# Patient Record
Sex: Male | Born: 1950
Health system: Southern US, Community
[De-identification: ages and names within clinical notes are randomized; demographics above are authoritative.]

## PROBLEM LIST (undated history)

## (undated) ENCOUNTER — Ambulatory Visit: Discharge status: Absent without leave | Payer: PPO

## (undated) DIAGNOSIS — E119 Type 2 diabetes mellitus without complications: Secondary | ICD-10-CM

## (undated) DIAGNOSIS — E785 Hyperlipidemia, unspecified: Secondary | ICD-10-CM

## (undated) DIAGNOSIS — I10 Essential (primary) hypertension: Secondary | ICD-10-CM

## (undated) DIAGNOSIS — F329 Major depressive disorder, single episode, unspecified: Secondary | ICD-10-CM

## (undated) DIAGNOSIS — F32A Depression, unspecified: Secondary | ICD-10-CM

## (undated) HISTORY — DX: Major depressive disorder, single episode, unspecified: F32.9

## (undated) HISTORY — PX: NASAL SINUS SURGERY: SHX719

## (undated) HISTORY — DX: Hyperlipidemia, unspecified: E78.5

## (undated) HISTORY — DX: Essential (primary) hypertension: I10

## (undated) HISTORY — DX: Type 2 diabetes mellitus without complications: E11.9

## (undated) HISTORY — DX: Depression, unspecified: F32.A

---

## 2005-08-24 ENCOUNTER — Ambulatory Visit: Payer: Self-pay | Admitting: Family Medicine

## 2007-02-21 ENCOUNTER — Encounter: Payer: Self-pay | Admitting: Physician Assistant

## 2007-02-27 ENCOUNTER — Encounter: Payer: Self-pay | Admitting: Physician Assistant

## 2008-04-22 ENCOUNTER — Inpatient Hospital Stay: Payer: Self-pay | Admitting: Internal Medicine

## 2008-04-28 ENCOUNTER — Emergency Department: Payer: Self-pay | Admitting: Emergency Medicine

## 2008-10-09 ENCOUNTER — Ambulatory Visit: Payer: Self-pay | Admitting: Urology

## 2009-02-19 ENCOUNTER — Ambulatory Visit: Payer: Self-pay | Admitting: Gastroenterology

## 2009-02-19 LAB — HM COLONOSCOPY

## 2009-08-04 ENCOUNTER — Ambulatory Visit: Payer: Self-pay | Admitting: Urology

## 2009-08-06 ENCOUNTER — Ambulatory Visit: Payer: Self-pay | Admitting: Urology

## 2009-08-18 ENCOUNTER — Ambulatory Visit: Payer: Self-pay | Admitting: Urology

## 2009-08-26 ENCOUNTER — Ambulatory Visit: Payer: Self-pay | Admitting: Urology

## 2009-08-28 ENCOUNTER — Ambulatory Visit: Payer: Self-pay | Admitting: Urology

## 2009-09-23 ENCOUNTER — Ambulatory Visit: Payer: Self-pay | Admitting: Urology

## 2009-11-25 IMAGING — CR DG CHEST 2V
1 series · 2 of 2 positions shown · non-contrast
Comparison: none

REASON FOR EXAM: CP
COMMENTS:

[Series 1: view not recorded · 0.17mm/px · 2 of 2 slices shown]
[im 1/2]
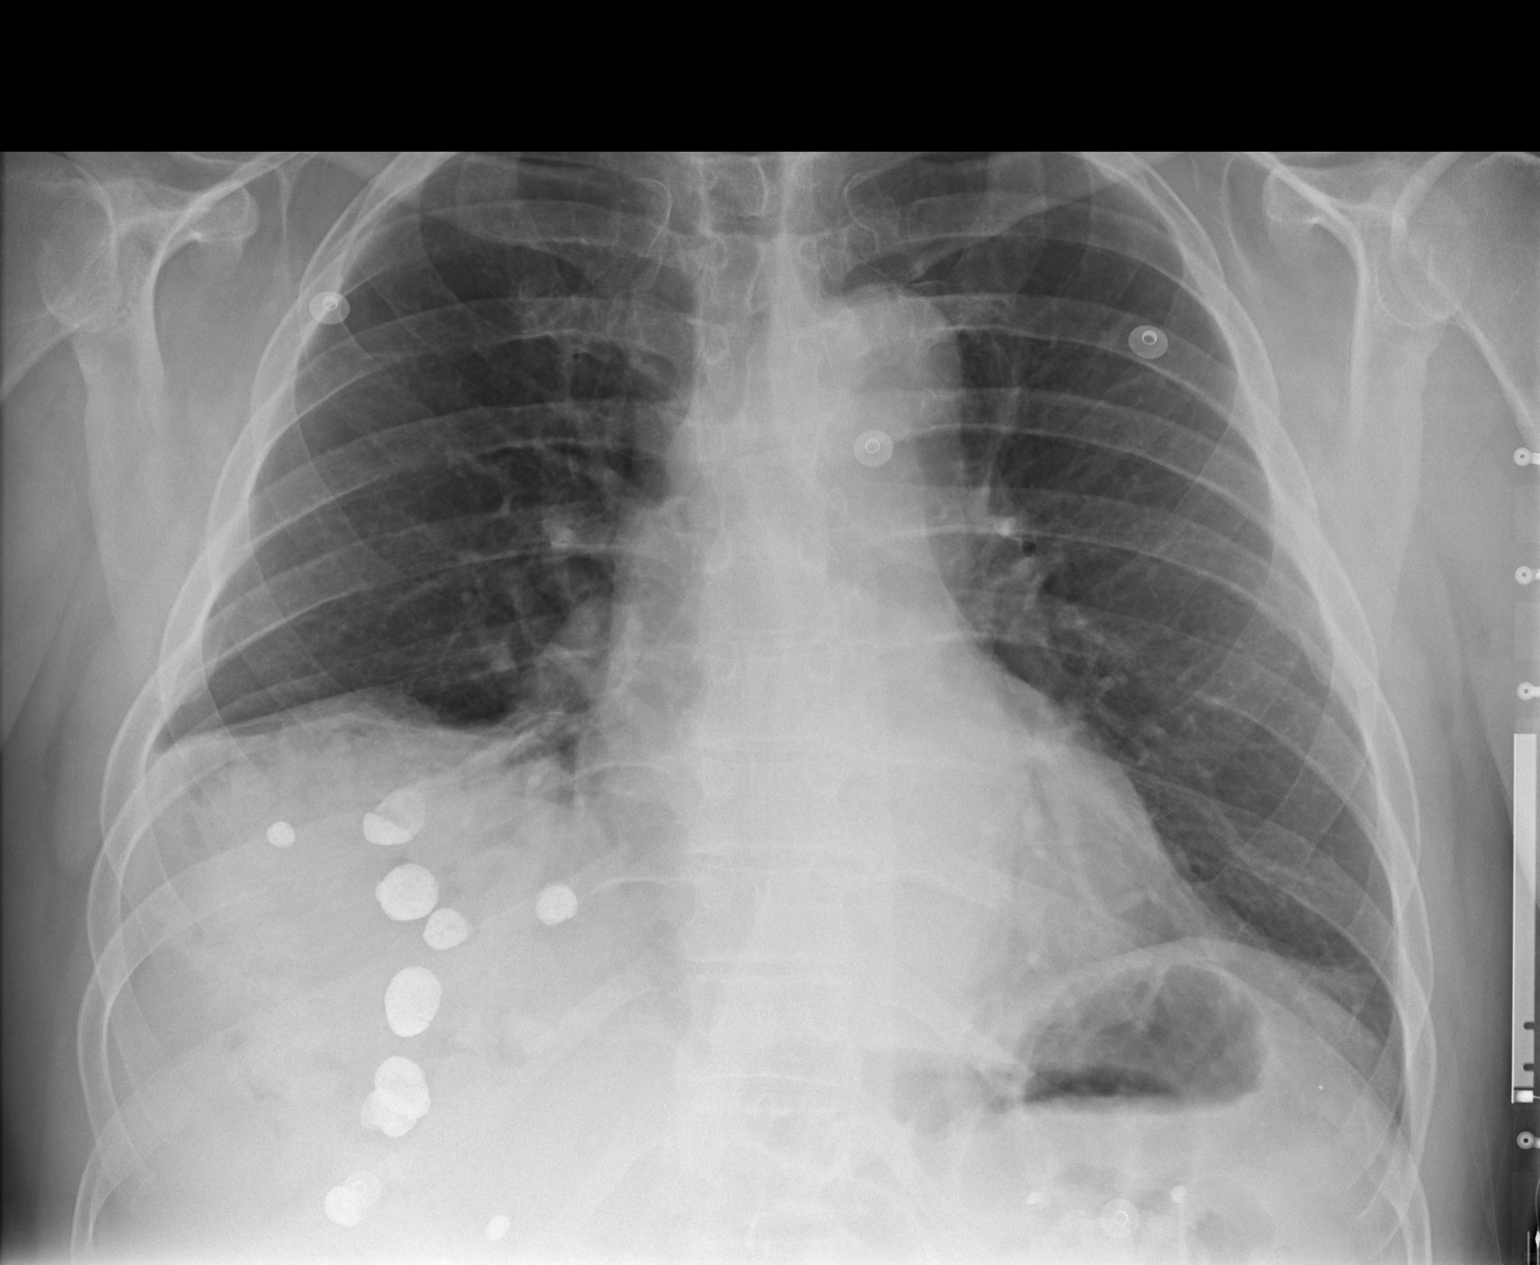
[im 2/2]
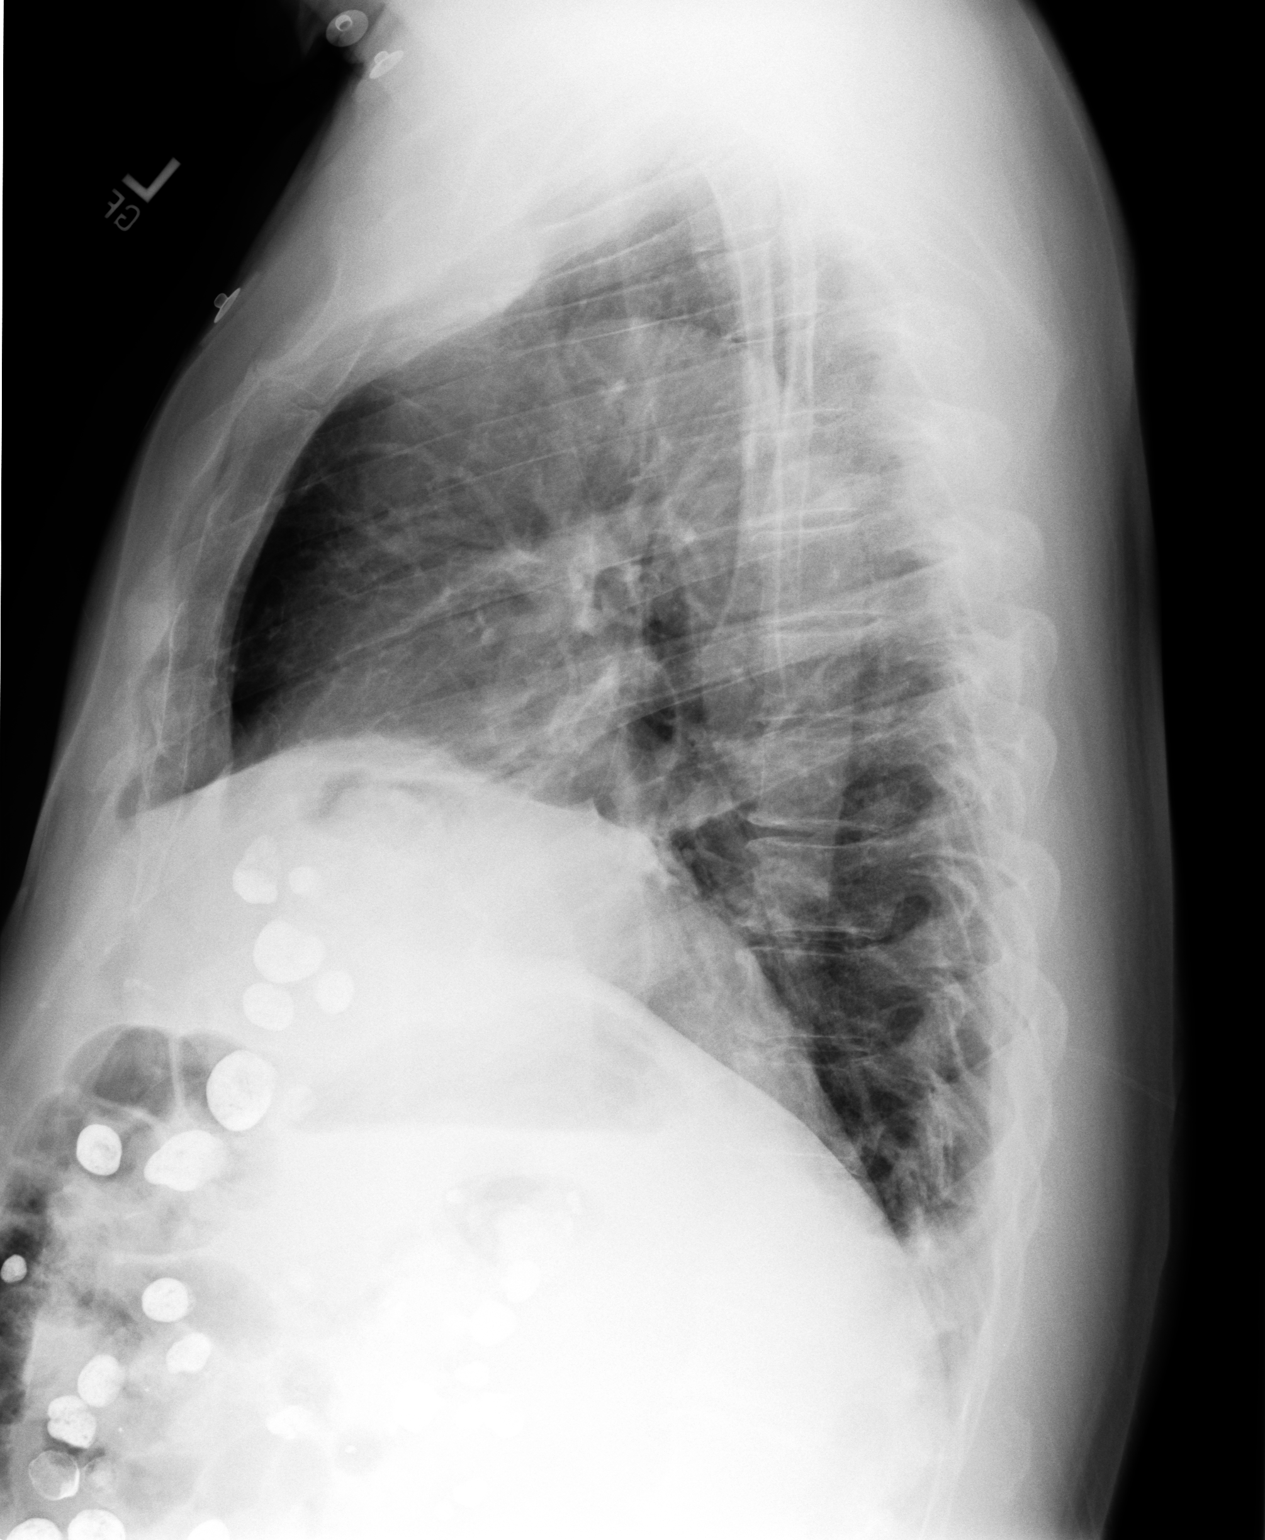

[2 of 2 positions shown; findings below may reference images not displayed]

PROCEDURE:     DXR - DXR CHEST PA (OR AP) AND LATERAL  - April 28, 2008  [DATE]

RESULT:     Comparison is made to the prior exam of 04/24/2008. The
previously small LEFT pleural effusion is no longer seen. There is minimal
LEFT basilar discoid atelectasis. Slight atelectasis is also present at the
RIGHT base. There is chronic elevation of the RIGHT hemidiaphragm, similar
to that noted on the prior exam. The lung fields otherwise are clear of
infiltrate. Heart size is normal. Incidentally noted is barium within
multiple diverticula of the colon consistent with residual contrast from
prior barium swallow examination.
IMPRESSION: 1. There is minimal bibasilar atelectasis.
2. The lung fields otherwise are clear.
3. Heart size is normal.
4. There persists elevation of the RIGHT hemidiaphragm.
5. Barium is present in multiple diverticula consistent with residual
contrast from prior barium swallow examination.

## 2011-09-14 ENCOUNTER — Ambulatory Visit: Payer: Self-pay | Admitting: Family Medicine

## 2014-07-30 LAB — LIPID PANEL
Cholesterol: 153 mg/dL (ref 0–200)
HDL: 45 mg/dL (ref 35–70)
LDL Cholesterol: 76 mg/dL
TRIGLYCERIDES: 158 mg/dL (ref 40–160)

## 2014-07-30 LAB — HEMOGLOBIN A1C: Hgb A1c MFr Bld: 6 % (ref 4.0–6.0)

## 2014-07-30 LAB — FECAL OCCULT BLOOD, GUAIAC: Fecal Occult Blood: NEGATIVE

## 2014-07-30 LAB — PSA: PSA: 2.4

## 2014-07-30 LAB — BASIC METABOLIC PANEL
BUN: 20 mg/dL (ref 4–21)
Creatinine: 0.9 mg/dL (ref <=1.3)
Glucose: 89 mg/dL

## 2014-08-02 DIAGNOSIS — Z0000 Encounter for general adult medical examination without abnormal findings: Secondary | ICD-10-CM | POA: Insufficient documentation

## 2014-08-02 DIAGNOSIS — E7849 Other hyperlipidemia: Secondary | ICD-10-CM | POA: Insufficient documentation

## 2014-08-02 DIAGNOSIS — I10 Essential (primary) hypertension: Secondary | ICD-10-CM | POA: Insufficient documentation

## 2014-08-02 DIAGNOSIS — E663 Overweight: Secondary | ICD-10-CM | POA: Insufficient documentation

## 2014-08-02 DIAGNOSIS — F339 Major depressive disorder, recurrent, unspecified: Secondary | ICD-10-CM | POA: Insufficient documentation

## 2014-08-02 DIAGNOSIS — E119 Type 2 diabetes mellitus without complications: Secondary | ICD-10-CM | POA: Insufficient documentation

## 2014-12-24 ENCOUNTER — Other Ambulatory Visit: Payer: Self-pay

## 2014-12-24 DIAGNOSIS — F329 Major depressive disorder, single episode, unspecified: Secondary | ICD-10-CM

## 2014-12-24 DIAGNOSIS — E785 Hyperlipidemia, unspecified: Secondary | ICD-10-CM

## 2014-12-24 DIAGNOSIS — E119 Type 2 diabetes mellitus without complications: Secondary | ICD-10-CM

## 2014-12-24 DIAGNOSIS — F32A Depression, unspecified: Secondary | ICD-10-CM

## 2014-12-24 DIAGNOSIS — I10 Essential (primary) hypertension: Secondary | ICD-10-CM

## 2014-12-24 MED ORDER — LOVASTATIN 20 MG PO TABS: 20.0000 mg | ORAL_TABLET | Freq: Every day | ORAL | Status: DC

## 2014-12-24 MED ORDER — AMLODIPINE BESYLATE 10 MG PO TABS: 10.0000 mg | ORAL_TABLET | Freq: Every day | ORAL | Status: DC

## 2014-12-24 MED ORDER — METFORMIN HCL 1000 MG PO TABS: 1000.0000 mg | ORAL_TABLET | Freq: Two times a day (BID) | ORAL | Status: DC

## 2014-12-24 MED ORDER — LISINOPRIL-HYDROCHLOROTHIAZIDE 20-12.5 MG PO TABS: 1.0000 | ORAL_TABLET | Freq: Every day | ORAL | Status: DC

## 2014-12-24 MED ORDER — GLIPIZIDE ER 2.5 MG PO TB24: 2.5000 mg | ORAL_TABLET | Freq: Every day | ORAL | Status: DC

## 2014-12-24 MED ORDER — VENLAFAXINE HCL ER 75 MG PO CP24: 75.0000 mg | ORAL_CAPSULE | Freq: Every day | ORAL | Status: DC

## 2015-02-17 ENCOUNTER — Ambulatory Visit (INDEPENDENT_AMBULATORY_CARE_PROVIDER_SITE_OTHER): Payer: BLUE CROSS/BLUE SHIELD | Admitting: Family Medicine

## 2015-02-17 ENCOUNTER — Encounter: Payer: Self-pay | Admitting: Family Medicine

## 2015-02-17 VITALS — BP 134/64 | HR 64 | Ht 73.0 in | Wt 192.0 lb

## 2015-02-17 DIAGNOSIS — I10 Essential (primary) hypertension: Secondary | ICD-10-CM | POA: Diagnosis not present

## 2015-02-17 DIAGNOSIS — Z0000 Encounter for general adult medical examination without abnormal findings: Secondary | ICD-10-CM

## 2015-02-17 DIAGNOSIS — Z23 Encounter for immunization: Secondary | ICD-10-CM | POA: Diagnosis not present

## 2015-02-17 DIAGNOSIS — E119 Type 2 diabetes mellitus without complications: Secondary | ICD-10-CM | POA: Diagnosis not present

## 2015-02-17 DIAGNOSIS — N400 Enlarged prostate without lower urinary tract symptoms: Secondary | ICD-10-CM | POA: Diagnosis not present

## 2015-02-17 DIAGNOSIS — E785 Hyperlipidemia, unspecified: Secondary | ICD-10-CM | POA: Diagnosis not present

## 2015-02-17 NOTE — Progress Notes (Signed)
Name: Andrew [name]   MRN: [id number]    DOB: December 02, 1950   Date:02/17/2015       Progress Note  Subjective  Chief Complaint  Chief Complaint  Patient presents with  . Annual Exam    HPI Comments: Patient for physical exam with no subjective/objective concerns.   No problem-specific assessment & plan notes found for this encounter.   Past Medical History  Diagnosis Date  . Hypertension   . Hyperlipidemia   . Diabetes mellitus without complication [facility])     Past Surgical History  Procedure Laterality Date  . Nasal sinus surgery      History reviewed. No pertinent family history.  Social History   Social History  . Marital Status: Married    Spouse Name: N/A  . Number of Children: N/A  . Years of Education: N/A   Occupational History  . Not on file.   Social History Main Topics  . Smoking status: Never Smoker   . Smokeless tobacco: Not on file  . Alcohol Use: 0.0 oz/week    0 Standard drinks or equivalent per week  . Drug Use: No  . Sexual Activity: Yes   Other Topics Concern  . Not on file   Social History Narrative    No Known Allergies   Review of Systems  Constitutional: Negative for fever, chills, weight loss and malaise/fatigue.  HENT: Negative for ear discharge, ear pain and sore throat.   Eyes: Negative for blurred vision.  Respiratory: Negative for cough, sputum production, shortness of breath and wheezing.   Cardiovascular: Negative for chest pain, palpitations and leg swelling.  Gastrointestinal: Negative for heartburn, nausea, abdominal pain, diarrhea, constipation, blood in stool and melena.  Genitourinary: Positive for frequency. Negative for dysuria, urgency and hematuria.  Musculoskeletal: Negative for myalgias, back pain, joint pain and neck pain.  Skin: Negative for rash.  Neurological: Negative for dizziness, tingling, sensory change, focal weakness and headaches.  Endo/Heme/Allergies: Negative for environmental allergies and  polydipsia. Does not bruise/bleed easily.  Psychiatric/Behavioral: Negative for depression and suicidal ideas. The patient is not nervous/anxious and does not have insomnia.      Objective  Filed Vitals:   02/17/15 0902  BP: 134/64  Pulse: 64  Height: 6\' 1"  (1.854 m)  Weight: 192 lb (87.091 kg)    Physical Exam  Constitutional: He is oriented to person, place, and time and well-developed, well-nourished, and in no distress.  HENT:  Head: Normocephalic.  Right Ear: External ear normal.  Left Ear: External ear normal.  Nose: Nose normal.  Mouth/Throat: Oropharynx is clear and moist.  Eyes: Conjunctivae and EOM are normal. Pupils are equal, round, and reactive to light. Right eye exhibits no discharge. Left eye exhibits no discharge. No scleral icterus.  Neck: Normal range of motion. Neck supple. No JVD present. No tracheal deviation present. No thyromegaly present.  Cardiovascular: Normal rate, regular rhythm, normal heart sounds and intact distal pulses.  Exam reveals no gallop and no friction rub.   No murmur heard. Pulmonary/Chest: Breath sounds normal. No respiratory distress. He has no wheezes. He has no rales.  Abdominal: Soft. Bowel sounds are normal. He exhibits no mass. There is no hepatosplenomegaly. There is no tenderness. There is no rebound, no guarding and no CVA tenderness.  Genitourinary: Rectum normal and prostate normal. Guaiac negative stool.  Musculoskeletal: Normal range of motion. He exhibits no edema or tenderness.  Lymphadenopathy:    He has no cervical adenopathy.  Neurological: He is alert and oriented  to person, place, and time. He has normal sensation, normal strength, normal reflexes and intact cranial nerves. No cranial nerve deficit.  Skin: Skin is warm. No rash noted.  Psychiatric: Mood and affect normal.  Nursing note and vitals reviewed.     Assessment & Plan  Problem List Items Addressed This Visit      Cardiovascular and Mediastinum    Essential (primary) hypertension   Relevant Orders   Renal Function Panel     Endocrine   Diabetes mellitus, type 2 ([place])   Relevant Orders   Renal Function Panel   HgB A1c    Other Visit Diagnoses    Annual physical exam    -  Primary    BPH (benign prostatic hyperplasia)        Relevant Orders    PSA    Hyperlipidemia        Relevant Orders    Lipid Profile    Need for influenza vaccination        Relevant Orders    Flu Vaccine QUAD 36+ mos PF IM (Fluarix & Fluzone Quad PF) (Completed)         Dr. Deanna Jones [facility]  02/17/2015

## 2015-02-18 LAB — HEMOGLOBIN A1C
Est. average glucose Bld gHb Est-mCnc: 117 mg/dL
Hgb A1c MFr Bld: 5.7 % — ABNORMAL HIGH (ref 4.8–5.6)

## 2015-02-18 LAB — PSA: PROSTATE SPECIFIC AG, SERUM: 2.1 ng/mL (ref 0.0–4.0)

## 2015-02-18 LAB — RENAL FUNCTION PANEL
ALBUMIN: 4.7 g/dL (ref 3.6–4.8)
BUN/Creatinine Ratio: 23 — ABNORMAL HIGH (ref 10–22)
BUN: 20 mg/dL (ref 8–27)
CO2: 27 mmol/L (ref 18–29)
CREATININE: 0.86 mg/dL (ref 0.76–1.27)
Calcium: 9.5 mg/dL (ref 8.6–10.2)
Chloride: 99 mmol/L (ref 97–106)
GFR calc Af Amer: 107 mL/min/{1.73_m2} (ref >59)
GFR, EST NON AFRICAN AMERICAN: 92 mL/min/{1.73_m2} (ref >59)
Glucose: 112 mg/dL — ABNORMAL HIGH (ref 65–99)
PHOSPHORUS: 3.3 mg/dL (ref 2.5–4.5)
Potassium: 4.8 mmol/L (ref 3.5–5.2)
Sodium: 142 mmol/L (ref 136–144)

## 2015-02-18 LAB — LIPID PANEL
Chol/HDL Ratio: 2.8 ratio units (ref 0.0–5.0)
Cholesterol, Total: 146 mg/dL (ref 100–199)
HDL: 53 mg/dL (ref >39)
LDL CALC: 75 mg/dL (ref 0–99)
TRIGLYCERIDES: 89 mg/dL (ref 0–149)
VLDL CHOLESTEROL CAL: 18 mg/dL (ref 5–40)

## 2015-04-21 ENCOUNTER — Other Ambulatory Visit: Payer: Self-pay

## 2015-04-21 DIAGNOSIS — E119 Type 2 diabetes mellitus without complications: Secondary | ICD-10-CM

## 2015-04-21 MED ORDER — GLIPIZIDE ER 2.5 MG PO TB24: 2.5000 mg | ORAL_TABLET | Freq: Every day | ORAL | Status: DC

## 2015-04-21 MED ORDER — METFORMIN HCL 1000 MG PO TABS: 1000.0000 mg | ORAL_TABLET | Freq: Two times a day (BID) | ORAL | Status: DC

## 2015-06-30 ENCOUNTER — Other Ambulatory Visit: Payer: Self-pay | Admitting: Family Medicine

## 2015-07-31 ENCOUNTER — Other Ambulatory Visit: Payer: Self-pay

## 2015-07-31 MED ORDER — ETODOLAC 500 MG PO TABS: 500.0000 mg | ORAL_TABLET | Freq: Two times a day (BID) | ORAL | Status: DC

## 2015-08-01 ENCOUNTER — Other Ambulatory Visit: Payer: Self-pay

## 2015-08-01 ENCOUNTER — Encounter: Payer: Self-pay | Admitting: Family Medicine

## 2015-08-01 ENCOUNTER — Ambulatory Visit (INDEPENDENT_AMBULATORY_CARE_PROVIDER_SITE_OTHER): Payer: BLUE CROSS/BLUE SHIELD | Admitting: Family Medicine

## 2015-08-01 VITALS — BP 140/80 | HR 78 | Ht 73.0 in | Wt 192.0 lb

## 2015-08-01 DIAGNOSIS — M25571 Pain in right ankle and joints of right foot: Secondary | ICD-10-CM | POA: Diagnosis not present

## 2015-08-01 MED ORDER — ETODOLAC 500 MG PO TABS: 500.0000 mg | ORAL_TABLET | Freq: Two times a day (BID) | ORAL | Status: DC

## 2015-08-01 NOTE — Progress Notes (Signed)
Name: Andrew [name]   MRN: [id number]    DOB: [birth year]   Date:08/01/2015       Progress Note  Subjective  Chief Complaint  Chief Complaint  Patient presents with  . Ankle Pain    started with swelling yesterday am- called in etodolac. Swelling hasn't gone down and now has pain to the touch    Ankle Pain  The incident occurred 2 days ago. The incident occurred at home. There was no injury mechanism. The pain is present in the right ankle. The quality of the pain is described as aching. The pain is at a severity of 4/10. The pain is mild. The pain has been intermittent since onset. Pertinent negatives include no inability to bear weight, loss of motion, loss of sensation, muscle weakness, numbness or tingling. He reports no foreign bodies present. He has tried acetaminophen for the symptoms. The treatment provided mild relief.    No problem-specific assessment & plan notes found for this encounter.   Past Medical History  Diagnosis Date  . Hypertension   . Hyperlipidemia   . Diabetes mellitus without complication [facility])     Past Surgical History  Procedure Laterality Date  . Nasal sinus surgery      No family history on file.  Social History   Social History  . Marital Status: Married    Spouse Name: N/A  . Number of Children: N/A  . Years of Education: N/A   Occupational History  . Not on file.   Social History Main Topics  . Smoking status: Never Smoker   . Smokeless tobacco: Not on file  . Alcohol Use: 0.0 oz/week    0 Standard drinks or equivalent per week  . Drug Use: No  . Sexual Activity: Yes   Other Topics Concern  . Not on file   Social History Narrative    No Known Allergies   Review of Systems  Constitutional: Negative for fever, chills, weight loss and malaise/fatigue.  HENT: Negative for ear discharge, ear pain and sore throat.   Eyes: Negative for blurred vision.  Respiratory: Negative for cough, sputum production, shortness of breath and  wheezing.   Cardiovascular: Negative for chest pain, palpitations and leg swelling.  Gastrointestinal: Negative for heartburn, nausea, abdominal pain, diarrhea, constipation, blood in stool and melena.  Genitourinary: Negative for dysuria, urgency, frequency and hematuria.  Musculoskeletal: Negative for myalgias, back pain, joint pain and neck pain.  Skin: Negative for rash.  Neurological: Negative for dizziness, tingling, sensory change, focal weakness, numbness and headaches.  Endo/Heme/Allergies: Negative for environmental allergies and polydipsia. Does not bruise/bleed easily.  Psychiatric/Behavioral: Negative for depression and suicidal ideas. The patient is not nervous/anxious and does not have insomnia.      Objective  Filed Vitals:   08/01/15 1617  BP: 140/80  Pulse: 78  Height: 6\' 1"  (1.854 m)  Weight: 192 lb (87.091 kg)    Physical Exam  Constitutional: He is oriented to person, place, and time and well-developed, well-nourished, and in no distress.  HENT:  Head: Normocephalic.  Right Ear: External ear normal.  Left Ear: External ear normal.  Nose: Nose normal.  Mouth/Throat: Oropharynx is clear and moist.  Eyes: Conjunctivae and EOM are normal. Pupils are equal, round, and reactive to light. Right eye exhibits no discharge. Left eye exhibits no discharge. No scleral icterus.  Neck: Normal range of motion. Neck supple. No JVD present. No tracheal deviation present. No thyromegaly present.  Cardiovascular: Normal rate, regular rhythm, normal  heart sounds and intact distal pulses.  Exam reveals no gallop and no friction rub.   No murmur heard. Pulmonary/Chest: Breath sounds normal. No respiratory distress. He has no wheezes. He has no rales.  Abdominal: Soft. Bowel sounds are normal. He exhibits no mass. There is no hepatosplenomegaly. There is no tenderness. There is no rebound, no guarding and no CVA tenderness.  Musculoskeletal: Normal range of motion. He exhibits no  edema.       Right ankle: He exhibits swelling. He exhibits normal range of motion. Tenderness. Lateral malleolus and CF ligament tenderness found.  Lymphadenopathy:    He has no cervical adenopathy.  Neurological: He is alert and oriented to person, place, and time. He has normal sensation, normal strength, normal reflexes and intact cranial nerves. No cranial nerve deficit.  Skin: Skin is warm. No rash noted.  Psychiatric: Mood and affect normal.  Nursing note and vitals reviewed.     Assessment & Plan  Problem List Items Addressed This Visit    None    Visit Diagnoses    Ankle pain, right    -  Primary    Relevant Medications    etodolac (LODINE) 500 MG tablet         Dr. Mac[facility]  08/01/2015

## 2015-08-07 ENCOUNTER — Encounter: Payer: Self-pay | Admitting: Family Medicine

## 2015-08-07 ENCOUNTER — Other Ambulatory Visit: Payer: Self-pay | Admitting: Family Medicine

## 2015-08-07 ENCOUNTER — Ambulatory Visit (INDEPENDENT_AMBULATORY_CARE_PROVIDER_SITE_OTHER): Payer: BLUE CROSS/BLUE SHIELD | Admitting: Family Medicine

## 2015-08-07 VITALS — BP 128/80 | HR 68 | Ht 73.0 in | Wt 189.0 lb

## 2015-08-07 DIAGNOSIS — E119 Type 2 diabetes mellitus without complications: Secondary | ICD-10-CM | POA: Diagnosis not present

## 2015-08-07 DIAGNOSIS — E663 Overweight: Secondary | ICD-10-CM | POA: Diagnosis not present

## 2015-08-07 DIAGNOSIS — F339 Major depressive disorder, recurrent, unspecified: Secondary | ICD-10-CM

## 2015-08-07 DIAGNOSIS — E785 Hyperlipidemia, unspecified: Secondary | ICD-10-CM | POA: Diagnosis not present

## 2015-08-07 DIAGNOSIS — I10 Essential (primary) hypertension: Secondary | ICD-10-CM

## 2015-08-07 DIAGNOSIS — E7849 Other hyperlipidemia: Secondary | ICD-10-CM

## 2015-08-07 DIAGNOSIS — E784 Other hyperlipidemia: Secondary | ICD-10-CM

## 2015-08-07 DIAGNOSIS — Z0000 Encounter for general adult medical examination without abnormal findings: Secondary | ICD-10-CM | POA: Diagnosis not present

## 2015-08-07 MED ORDER — AMLODIPINE BESYLATE 10 MG PO TABS: 10.0000 mg | ORAL_TABLET | Freq: Every day | ORAL | Status: DC

## 2015-08-07 MED ORDER — LOVASTATIN 20 MG PO TABS: 20.0000 mg | ORAL_TABLET | Freq: Every day | ORAL | Status: DC

## 2015-08-07 MED ORDER — GLIPIZIDE ER 2.5 MG PO TB24: 2.5000 mg | ORAL_TABLET | Freq: Every day | ORAL | Status: DC

## 2015-08-07 MED ORDER — LISINOPRIL-HYDROCHLOROTHIAZIDE 20-12.5 MG PO TABS: 1.0000 | ORAL_TABLET | Freq: Every day | ORAL | Status: DC

## 2015-08-07 MED ORDER — VENLAFAXINE HCL ER 75 MG PO CP24: 75.0000 mg | ORAL_CAPSULE | Freq: Every day | ORAL | Status: DC

## 2015-08-07 MED ORDER — METFORMIN HCL 1000 MG PO TABS: 1000.0000 mg | ORAL_TABLET | Freq: Two times a day (BID) | ORAL | Status: DC

## 2015-08-07 NOTE — Progress Notes (Signed)
Name: Andrew [name]   MRN: [id number]    DOB: Jun 04, 1950   Date:08/07/2015       Progress Note  Subjective  Chief Complaint  Chief Complaint  Patient presents with  . Hypertension  . Hyperlipidemia  . Diabetes  . Depression    Hypertension This is a chronic problem. The current episode started more than 1 year ago. The problem has been gradually improving since onset. The problem is controlled. Pertinent negatives include no anxiety, blurred vision, chest pain, headaches, malaise/fatigue, neck pain, orthopnea, palpitations, peripheral edema, PND, shortness of breath or sweats. There are no associated agents to hypertension. There are no known risk factors for coronary artery disease. Past treatments include ACE inhibitors, diuretics and calcium channel blockers. The current treatment provides mild improvement. There are no compliance problems.  There is no history of angina, kidney disease, CAD/MI, CVA, heart failure, left ventricular hypertrophy, PVD, renovascular disease or retinopathy. There is no history of chronic renal disease or a hypertension causing med.  Hyperlipidemia This is a chronic problem. The current episode started more than 1 year ago. The problem is controlled. Recent lipid tests were reviewed and are normal. He has no history of chronic renal disease, diabetes, hypothyroidism, liver disease, obesity or nephrotic syndrome. Pertinent negatives include no chest pain, focal weakness, myalgias or shortness of breath. The current treatment provides mild improvement of lipids. There are no compliance problems.  Risk factors for coronary artery disease include diabetes mellitus and dyslipidemia.  Diabetes He presents for his follow-up diabetic visit. He has type 2 diabetes mellitus. His disease course has been stable. Pertinent negatives for hypoglycemia include no confusion, dizziness, headaches, hunger, mood changes, nervousness/anxiousness, pallor, seizures, sleepiness, speech  difficulty, sweats or tremors. Pertinent negatives for diabetes include no blurred vision, no chest pain, no fatigue, no foot paresthesias, no foot ulcerations, no polydipsia, no polyphagia, no polyuria, no visual change, no weakness and no weight loss. There are no hypoglycemic complications. There are no diabetic complications. Pertinent negatives for diabetic complications include no CVA, PVD or retinopathy. There are no known risk factors for coronary artery disease. Current diabetic treatment includes diet and oral agent (dual therapy). He is compliant with treatment all of the time. His weight is stable. He is following a generally healthy diet. He has not had a previous visit with a dietitian. He participates in exercise intermittently. His breakfast blood glucose is taken between 8-9 am. His breakfast blood glucose range is generally 180-200 mg/dl. An ACE inhibitor/angiotensin II receptor blocker is being taken. He does not see a podiatrist.Eye exam is current.  Depression        This is a chronic problem.  The current episode started more than 1 year ago.   The onset quality is gradual.   The problem has been gradually improving since onset.  Associated symptoms include no fatigue, no helplessness, no hopelessness, does not have insomnia, not irritable, no restlessness, no decreased interest, no myalgias, no headaches, not sad and no suicidal ideas.     The symptoms are aggravated by nothing.  Past treatments include SSRIs - Selective serotonin reuptake inhibitors.  Compliance with treatment is good.   Pertinent negatives include no chronic fatigue syndrome, no hypothyroidism, no terminal illness, no anxiety and no eating disorder.   No problem-specific assessment & plan notes found for this encounter.   Past Medical History  Diagnosis Date  . Hypertension   . Hyperlipidemia   . Diabetes mellitus without complication ([place])  Past Surgical History  Procedure Laterality Date  . Nasal sinus  surgery      History reviewed. No pertinent family history.  Social History   Social History  . Marital Status: Married    Spouse Name: N/A  . Number of Children: N/A  . Years of Education: N/A   Occupational History  . Not on file.   Social History Main Topics  . Smoking status: Never Smoker   . Smokeless tobacco: Not on file  . Alcohol Use: 0.0 oz/week    0 Standard drinks or equivalent per week  . Drug Use: No  . Sexual Activity: Yes   Other Topics Concern  . Not on file   Social History Narrative    No Known Allergies   Review of Systems  Constitutional: Negative for fever, chills, weight loss, malaise/fatigue and fatigue.  HENT: Negative for ear discharge, ear pain and sore throat.   Eyes: Negative for blurred vision.  Respiratory: Negative for cough, sputum production, shortness of breath and wheezing.   Cardiovascular: Negative for chest pain, palpitations, orthopnea, leg swelling and PND.  Gastrointestinal: Negative for heartburn, nausea, abdominal pain, diarrhea, constipation, blood in stool and melena.  Genitourinary: Negative for dysuria, urgency, frequency and hematuria.  Musculoskeletal: Negative for myalgias, back pain, joint pain and neck pain.  Skin: Negative for pallor and rash.  Neurological: Negative for dizziness, tingling, tremors, sensory change, focal weakness, seizures, speech difficulty, weakness and headaches.  Endo/Heme/Allergies: Negative for environmental allergies, polydipsia and polyphagia. Does not bruise/bleed easily.  Psychiatric/Behavioral: Positive for depression. Negative for suicidal ideas and confusion. The patient is not nervous/anxious and does not have insomnia.      Objective  Filed Vitals:   08/07/15 0841  BP: 128/80  Pulse: 68  Height: 6\' 1"  (1.854 m)  Weight: 189 lb (85.73 kg)    Physical Exam  Constitutional: He is oriented to person, place, and time and well-developed, well-nourished, and in no distress. He is  not irritable.  HENT:  Head: Normocephalic.  Right Ear: Tympanic membrane, external ear and ear canal normal.  Left Ear: Tympanic membrane, external ear and ear canal normal.  Nose: Nose normal.  Mouth/Throat: Oropharynx is clear and moist.  Eyes: Conjunctivae and EOM are normal. Pupils are equal, round, and reactive to light. Right eye exhibits no discharge. Left eye exhibits no discharge. No scleral icterus.  Neck: Normal range of motion. Neck supple. No JVD present. No tracheal deviation present. No thyromegaly present.  Cardiovascular: Normal rate, regular rhythm, normal heart sounds and intact distal pulses.  Exam reveals no gallop and no friction rub.   No murmur heard. Pulmonary/Chest: Breath sounds normal. No respiratory distress. He has no wheezes. He has no rales.  Abdominal: Soft. Bowel sounds are normal. He exhibits no mass. There is no hepatosplenomegaly. There is no tenderness. There is no rebound, no guarding and no CVA tenderness.  Musculoskeletal: Normal range of motion. He exhibits no edema or tenderness.  Lymphadenopathy:    He has no cervical adenopathy.  Neurological: He is alert and oriented to person, place, and time. He has normal sensation, normal strength and intact cranial nerves. No cranial nerve deficit.  Foot exam normal  Skin: Skin is warm. No rash noted.  Psychiatric: Mood and affect normal.  Nursing note and vitals reviewed.     Assessment & Plan  Problem List Items Addressed This Visit      Cardiovascular and Mediastinum   Essential (primary) hypertension - Primary   Relevant  Medications   lovastatin (MEVACOR) 20 MG tablet   amLODipine (NORVASC) 10 MG tablet   lisinopril-hydrochlorothiazide (PRINZIDE,ZESTORETIC) 20-12.5 MG tablet   Other Relevant Orders   Renal Function Panel     Endocrine   Diabetes mellitus, type 2 (HCC)   Relevant Medications   metFORMIN (GLUCOPHAGE) 1000 MG tablet   glipiZIDE (GLUCOTROL XL) 2.5 MG 24 hr tablet    lovastatin (MEVACOR) 20 MG tablet   lisinopril-hydrochlorothiazide (PRINZIDE,ZESTORETIC) 20-12.5 MG tablet   Other Relevant Orders   Hemoglobin A1c   Lipid Profile   Microalbumin / creatinine urine ratio     Other   Familial multiple lipoprotein-type hyperlipidemia   Relevant Medications   lovastatin (MEVACOR) 20 MG tablet   amLODipine (NORVASC) 10 MG tablet   lisinopril-hydrochlorothiazide (PRINZIDE,ZESTORETIC) 20-12.5 MG tablet   Routine general medical examination at a health care facility   Recurrent major depressive episodes (HCC)   Relevant Medications   venlafaxine XR (EFFEXOR-XR) 75 MG 24 hr capsule   Overweight    Other Visit Diagnoses    Hyperlipidemia        Relevant Medications    lovastatin (MEVACOR) 20 MG tablet    amLODipine (NORVASC) 10 MG tablet    lisinopril-hydrochlorothiazide (PRINZIDE,ZESTORETIC) 20-12.5 MG tablet         Dr. Rabecka [facility]  08/07/2015

## 2015-08-08 LAB — HEMOGLOBIN A1C
ESTIMATED AVERAGE GLUCOSE: 128 mg/dL
Hgb A1c MFr Bld: 6.1 % — ABNORMAL HIGH (ref 4.8–5.6)

## 2015-08-08 LAB — RENAL FUNCTION PANEL
ALBUMIN: 4.6 g/dL (ref 3.6–4.8)
BUN / CREAT RATIO: 18 (ref 10–24)
BUN: 16 mg/dL (ref 8–27)
CALCIUM: 9.6 mg/dL (ref 8.6–10.2)
CHLORIDE: 96 mmol/L (ref 96–106)
CO2: 27 mmol/L (ref 18–29)
CREATININE: 0.87 mg/dL (ref 0.76–1.27)
GFR calc Af Amer: 105 mL/min/{1.73_m2} (ref >59)
GFR calc non Af Amer: 91 mL/min/{1.73_m2} (ref >59)
Glucose: 99 mg/dL (ref 65–99)
PHOSPHORUS: 3.1 mg/dL (ref 2.5–4.5)
Potassium: 4 mmol/L (ref 3.5–5.2)
Sodium: 141 mmol/L (ref 134–144)

## 2015-08-08 LAB — LIPID PANEL
CHOLESTEROL TOTAL: 179 mg/dL (ref 100–199)
Chol/HDL Ratio: 3.9 ratio units (ref 0.0–5.0)
HDL: 46 mg/dL (ref >39)
LDL CALC: 111 mg/dL — AB (ref 0–99)
TRIGLYCERIDES: 112 mg/dL (ref 0–149)
VLDL CHOLESTEROL CAL: 22 mg/dL (ref 5–40)

## 2015-08-08 LAB — MICROALBUMIN / CREATININE URINE RATIO
Creatinine, Urine: 170 mg/dL
MICROALB/CREAT RATIO: 535.5 mg/g creat — ABNORMAL HIGH (ref 0.0–30.0)
Microalbumin, Urine: 910.3 ug/mL

## 2015-09-28 ENCOUNTER — Other Ambulatory Visit: Payer: Self-pay | Admitting: Family Medicine

## 2016-02-24 ENCOUNTER — Other Ambulatory Visit: Payer: Self-pay | Admitting: Family Medicine

## 2016-02-24 DIAGNOSIS — E119 Type 2 diabetes mellitus without complications: Secondary | ICD-10-CM

## 2016-02-25 ENCOUNTER — Other Ambulatory Visit: Payer: Self-pay

## 2016-02-25 DIAGNOSIS — E119 Type 2 diabetes mellitus without complications: Secondary | ICD-10-CM

## 2016-02-25 MED ORDER — GLIPIZIDE ER 2.5 MG PO TB24: 2.5000 mg | ORAL_TABLET | Freq: Every day | ORAL | 0 refills | Status: DC

## 2016-03-03 ENCOUNTER — Other Ambulatory Visit: Payer: Self-pay | Admitting: Family Medicine

## 2016-03-03 DIAGNOSIS — E119 Type 2 diabetes mellitus without complications: Secondary | ICD-10-CM

## 2016-03-15 ENCOUNTER — Ambulatory Visit (INDEPENDENT_AMBULATORY_CARE_PROVIDER_SITE_OTHER): Payer: BLUE CROSS/BLUE SHIELD | Admitting: Family Medicine

## 2016-03-15 ENCOUNTER — Encounter: Payer: Self-pay | Admitting: Family Medicine

## 2016-03-15 VITALS — BP 142/82 | HR 58 | Resp 16 | Ht 73.0 in | Wt 192.6 lb

## 2016-03-15 DIAGNOSIS — E119 Type 2 diabetes mellitus without complications: Secondary | ICD-10-CM | POA: Diagnosis not present

## 2016-03-15 DIAGNOSIS — E784 Other hyperlipidemia: Secondary | ICD-10-CM | POA: Diagnosis not present

## 2016-03-15 DIAGNOSIS — E7849 Other hyperlipidemia: Secondary | ICD-10-CM

## 2016-03-15 DIAGNOSIS — I10 Essential (primary) hypertension: Secondary | ICD-10-CM

## 2016-03-15 DIAGNOSIS — E663 Overweight: Secondary | ICD-10-CM | POA: Diagnosis not present

## 2016-03-15 DIAGNOSIS — F339 Major depressive disorder, recurrent, unspecified: Secondary | ICD-10-CM

## 2016-03-15 DIAGNOSIS — E782 Mixed hyperlipidemia: Secondary | ICD-10-CM

## 2016-03-15 MED ORDER — METFORMIN HCL 1000 MG PO TABS: 1000.0000 mg | ORAL_TABLET | Freq: Two times a day (BID) | ORAL | 1 refills | Status: DC

## 2016-03-15 MED ORDER — GLIPIZIDE ER 2.5 MG PO TB24: 2.5000 mg | ORAL_TABLET | Freq: Every day | ORAL | 1 refills | Status: DC

## 2016-03-15 MED ORDER — LISINOPRIL-HYDROCHLOROTHIAZIDE 20-12.5 MG PO TABS: 1.0000 | ORAL_TABLET | Freq: Every day | ORAL | 1 refills | Status: DC

## 2016-03-15 MED ORDER — VENLAFAXINE HCL ER 75 MG PO CP24: 75.0000 mg | ORAL_CAPSULE | Freq: Every day | ORAL | 1 refills | Status: DC

## 2016-03-15 MED ORDER — AMLODIPINE BESYLATE 10 MG PO TABS: 10.0000 mg | ORAL_TABLET | Freq: Every day | ORAL | 1 refills | Status: DC

## 2016-03-15 MED ORDER — LOVASTATIN 20 MG PO TABS: 20.0000 mg | ORAL_TABLET | Freq: Every day | ORAL | 1 refills | Status: DC

## 2016-03-15 NOTE — Progress Notes (Signed)
Name: Andrew [name]   MRN: [id number]    DOB: 10/22/1950   Date:03/15/2016       Progress Note  Subjective  Chief Complaint  Chief Complaint  Patient presents with  . Hypertension    Hypertension  This is a chronic problem. The current episode started more than 1 year ago. The problem has been waxing and waning since onset. The problem is controlled. Pertinent negatives include no anxiety, blurred vision, chest pain, headaches, malaise/fatigue, neck pain, orthopnea, palpitations, peripheral edema, PND, shortness of breath or sweats. There are no associated agents to hypertension. There are no known risk factors for coronary artery disease. Past treatments include diuretics, ACE inhibitors and calcium channel blockers. The current treatment provides no improvement. There are no compliance problems.  There is no history of angina, kidney disease, CAD/MI, CVA, heart failure, left ventricular hypertrophy, PVD, renovascular disease or retinopathy. There is no history of chronic renal disease or a hypertension causing med.  Depression         This is a chronic problem.  The current episode started more than 1 year ago.   The onset quality is gradual.   The problem occurs daily.  The problem has been rapidly improving since onset.  Associated symptoms include no decreased concentration, no fatigue, no helplessness, no hopelessness, does not have insomnia, not irritable, no restlessness, no decreased interest, no appetite change, no myalgias, no headaches, no indigestion, not sad and no suicidal ideas.  Past treatments include SSRIs - Selective serotonin reuptake inhibitors.  Compliance with treatment is good.  Previous treatment provided mild relief.  Risk factors include a change in medication usage/dosage.    Pertinent negatives include no chronic fatigue syndrome, no anxiety, no eating disorder and no mental health disorder. Diabetes  He presents for his follow-up diabetic visit. He has type 2 diabetes  mellitus. His disease course has been stable. Pertinent negatives for hypoglycemia include no dizziness, headaches, nervousness/anxiousness or sweats. Pertinent negatives for diabetes include no blurred vision, no chest pain, no fatigue, no polydipsia and no weight loss. Pertinent negatives for diabetic complications include no CVA, PVD or retinopathy. Current diabetic treatment includes oral agent (dual therapy). His weight is stable. He is following a generally healthy diet. He participates in exercise daily. His breakfast blood glucose is taken between 8-9 am. An ACE inhibitor/angiotensin II receptor blocker is being taken. He does not see a podiatrist.Eye exam is not current.    No problem-specific Assessment & Plan notes found for this encounter.   Past Medical History:  Diagnosis Date  . Diabetes mellitus without complication ([place])   . Hyperlipidemia   . Hypertension     Past Surgical History:  Procedure Laterality Date  . NASAL SINUS SURGERY      History reviewed. No pertinent family history.  Social History   Social History  . Marital status: Married    Spouse name: N/A  . Number of children: N/A  . Years of education: N/A   Occupational History  . Not on file.   Social History Main Topics  . Smoking status: Never Smoker  . Smokeless tobacco: Never Used  . Alcohol use 0.0 oz/week  . Drug use: No  . Sexual activity: Yes   Other Topics Concern  . Not on file   Social History Narrative  . No narrative on file    No Known Allergies   Review of Systems  Constitutional: Negative for appetite change, chills, fatigue, fever, malaise/fatigue and weight loss.  HENT: Negative for ear discharge, ear pain and sore throat.   Eyes: Negative for blurred vision.  Respiratory: Negative for cough, sputum production, shortness of breath and wheezing.   Cardiovascular: Negative for chest pain, palpitations, orthopnea, leg swelling and PND.  Gastrointestinal: Negative for  abdominal pain, blood in stool, constipation, diarrhea, heartburn, melena and nausea.  Genitourinary: Negative for dysuria, frequency, hematuria and urgency.  Musculoskeletal: Negative for back pain, joint pain, myalgias and neck pain.  Skin: Negative for rash.  Neurological: Negative for dizziness, tingling, sensory change, focal weakness and headaches.  Endo/Heme/Allergies: Negative for environmental allergies and polydipsia. Does not bruise/bleed easily.  Psychiatric/Behavioral: Positive for depression. Negative for decreased concentration and suicidal ideas. The patient is not nervous/anxious and does not have insomnia.      Objective  Vitals:   03/15/16 1038  BP: (!) 142/82  Pulse: (!) 58  Resp: 16  SpO2: 98%  Weight: 192 lb 9.6 oz (87.4 kg)  Height: 6\' 1"  (1.854 m)    Physical Exam  Constitutional: He is oriented to person, place, and time and well-developed, well-nourished, and in no distress. He is not irritable.  HENT:  Head: Normocephalic.  Right Ear: External ear normal.  Left Ear: External ear normal.  Nose: Nose normal.  Mouth/Throat: Oropharynx is clear and moist.  Eyes: Conjunctivae and EOM are normal. Pupils are equal, round, and reactive to light. Right eye exhibits no discharge. Left eye exhibits no discharge. No scleral icterus.  Neck: Normal range of motion. Neck supple. No JVD present. No tracheal deviation present. No thyromegaly present.  Cardiovascular: Normal rate, regular rhythm, normal heart sounds and intact distal pulses.  Exam reveals no gallop and no friction rub.   No murmur heard. Pulmonary/Chest: Breath sounds normal. No respiratory distress. He has no wheezes. He has no rales.  Abdominal: Soft. Bowel sounds are normal. He exhibits no mass. There is no hepatosplenomegaly. There is no tenderness. There is no rebound, no guarding and no CVA tenderness.  Musculoskeletal: Normal range of motion. He exhibits no edema or tenderness.  Lymphadenopathy:     He has no cervical adenopathy.  Neurological: He is alert and oriented to person, place, and time. He has normal sensation, normal strength, normal reflexes and intact cranial nerves. No cranial nerve deficit.  Skin: Skin is warm. No rash noted.  Psychiatric: Mood and affect normal.  Nursing note and vitals reviewed.     Assessment & Plan  Problem List Items Addressed This Visit      Cardiovascular and Mediastinum   Essential (primary) hypertension - Primary   Relevant Medications   lovastatin (MEVACOR) 20 MG tablet   amLODipine (NORVASC) 10 MG tablet   lisinopril-hydrochlorothiazide (PRINZIDE,ZESTORETIC) 20-12.5 MG tablet   Other Relevant Orders   Renal Function Panel     Endocrine   Diabetes mellitus, type 2 (HCC)   Relevant Medications   lovastatin (MEVACOR) 20 MG tablet   glipiZIDE (GLUCOTROL XL) 2.5 MG 24 hr tablet   metFORMIN (GLUCOPHAGE) 1000 MG tablet   lisinopril-hydrochlorothiazide (PRINZIDE,ZESTORETIC) 20-12.5 MG tablet   Other Relevant Orders   Hemoglobin A1c   Microalbumin / creatinine urine ratio     Other   Familial multiple lipoprotein-type hyperlipidemia   Relevant Medications   lovastatin (MEVACOR) 20 MG tablet   amLODipine (NORVASC) 10 MG tablet   lisinopril-hydrochlorothiazide (PRINZIDE,ZESTORETIC) 20-12.5 MG tablet   Other Relevant Orders   Lipid Profile   Recurrent major depressive episodes (HCC)   Relevant Medications   venlafaxine XR (EFFEXOR-XR)  75 MG 24 hr capsule   Overweight    Other Visit Diagnoses    Mixed hyperlipidemia       Relevant Medications   lovastatin (MEVACOR) 20 MG tablet   amLODipine (NORVASC) 10 MG tablet   lisinopril-hydrochlorothiazide (PRINZIDE,ZESTORETIC) 20-12.5 MG tablet        Dr. Macon [facility]  03/15/16

## 2016-03-16 LAB — RENAL FUNCTION PANEL
Albumin: 5 g/dL — ABNORMAL HIGH (ref 3.6–4.8)
BUN / CREAT RATIO: 20 (ref 10–24)
BUN: 17 mg/dL (ref 8–27)
CALCIUM: 10.1 mg/dL (ref 8.6–10.2)
CHLORIDE: 97 mmol/L (ref 96–106)
CO2: 28 mmol/L (ref 18–29)
CREATININE: 0.85 mg/dL (ref 0.76–1.27)
GFR calc Af Amer: 106 mL/min/{1.73_m2} (ref >59)
GFR calc non Af Amer: 92 mL/min/{1.73_m2} (ref >59)
Glucose: 98 mg/dL (ref 65–99)
PHOSPHORUS: 3.6 mg/dL (ref 2.5–4.5)
Potassium: 4.3 mmol/L (ref 3.5–5.2)
SODIUM: 141 mmol/L (ref 134–144)

## 2016-03-16 LAB — LIPID PANEL
CHOL/HDL RATIO: 4.1 ratio (ref 0.0–5.0)
CHOLESTEROL TOTAL: 208 mg/dL — AB (ref 100–199)
HDL: 51 mg/dL (ref >39)
LDL Calculated: 131 mg/dL — ABNORMAL HIGH (ref 0–99)
TRIGLYCERIDES: 131 mg/dL (ref 0–149)
VLDL Cholesterol Cal: 26 mg/dL (ref 5–40)

## 2016-03-16 LAB — MICROALBUMIN / CREATININE URINE RATIO
Creatinine, Urine: 89.6 mg/dL
MICROALB/CREAT RATIO: 672.3 mg/g{creat} — AB (ref 0.0–30.0)
Microalbumin, Urine: 602.4 ug/mL

## 2016-03-16 LAB — HEMOGLOBIN A1C
ESTIMATED AVERAGE GLUCOSE: 111 mg/dL
HEMOGLOBIN A1C: 5.5 % (ref 4.8–5.6)

## 2016-03-29 ENCOUNTER — Other Ambulatory Visit: Payer: Self-pay | Admitting: Family Medicine

## 2016-03-29 DIAGNOSIS — I10 Essential (primary) hypertension: Secondary | ICD-10-CM

## 2016-03-29 DIAGNOSIS — F339 Major depressive disorder, recurrent, unspecified: Secondary | ICD-10-CM

## 2016-06-03 ENCOUNTER — Other Ambulatory Visit: Payer: Self-pay

## 2016-06-03 DIAGNOSIS — E119 Type 2 diabetes mellitus without complications: Secondary | ICD-10-CM

## 2016-06-03 MED ORDER — GLIPIZIDE ER 2.5 MG PO TB24: 2.5000 mg | ORAL_TABLET | Freq: Every day | ORAL | 0 refills | Status: DC

## 2016-09-09 ENCOUNTER — Other Ambulatory Visit: Payer: Self-pay

## 2016-09-09 DIAGNOSIS — E119 Type 2 diabetes mellitus without complications: Secondary | ICD-10-CM

## 2016-09-09 MED ORDER — GLIPIZIDE ER 2.5 MG PO TB24
2.5000 mg | ORAL_TABLET | Freq: Every day | ORAL | 0 refills | Status: DC
Start: 2016-09-09 — End: 2016-10-05

## 2016-09-22 ENCOUNTER — Other Ambulatory Visit: Payer: Self-pay | Admitting: Family Medicine

## 2016-09-22 DIAGNOSIS — I10 Essential (primary) hypertension: Secondary | ICD-10-CM

## 2016-09-22 DIAGNOSIS — F339 Major depressive disorder, recurrent, unspecified: Secondary | ICD-10-CM

## 2016-10-05 ENCOUNTER — Ambulatory Visit (INDEPENDENT_AMBULATORY_CARE_PROVIDER_SITE_OTHER): Payer: PPO | Admitting: Family Medicine

## 2016-10-05 ENCOUNTER — Encounter: Payer: Self-pay | Admitting: Family Medicine

## 2016-10-05 VITALS — BP 138/80 | HR 56 | Ht 73.0 in | Wt 194.0 lb

## 2016-10-05 DIAGNOSIS — E119 Type 2 diabetes mellitus without complications: Secondary | ICD-10-CM | POA: Diagnosis not present

## 2016-10-05 DIAGNOSIS — F339 Major depressive disorder, recurrent, unspecified: Secondary | ICD-10-CM | POA: Diagnosis not present

## 2016-10-05 DIAGNOSIS — I10 Essential (primary) hypertension: Secondary | ICD-10-CM

## 2016-10-05 DIAGNOSIS — E782 Mixed hyperlipidemia: Secondary | ICD-10-CM | POA: Diagnosis not present

## 2016-10-05 MED ORDER — AMLODIPINE BESYLATE 10 MG PO TABS: 10.0000 mg | ORAL_TABLET | Freq: Every day | ORAL | 1 refills | Status: DC

## 2016-10-05 MED ORDER — GLIPIZIDE ER 2.5 MG PO TB24: 2.5000 mg | ORAL_TABLET | Freq: Every day | ORAL | 1 refills | Status: DC

## 2016-10-05 MED ORDER — METFORMIN HCL 1000 MG PO TABS: 1000.0000 mg | ORAL_TABLET | Freq: Two times a day (BID) | ORAL | 1 refills | Status: DC

## 2016-10-05 MED ORDER — VENLAFAXINE HCL ER 75 MG PO CP24: 75.0000 mg | ORAL_CAPSULE | Freq: Every day | ORAL | 1 refills | Status: DC

## 2016-10-05 MED ORDER — LISINOPRIL-HYDROCHLOROTHIAZIDE 20-12.5 MG PO TABS: 1.0000 | ORAL_TABLET | Freq: Every day | ORAL | 1 refills | Status: DC

## 2016-10-05 NOTE — Progress Notes (Signed)
Name: Andrew [name]   MRN: [id number]    DOB: Nov 13, 1950   Date:10/05/2016       Progress Note  Subjective  Chief Complaint  Chief Complaint  Patient presents with  . Hypertension  . Hyperlipidemia  . Diabetes  . Depression    Hypertension  This is a chronic problem. The current episode started more than 1 year ago. The problem is unchanged. The problem is controlled. Pertinent negatives include no anxiety, blurred vision, chest pain, headaches, malaise/fatigue, neck pain, orthopnea, palpitations, peripheral edema, PND, shortness of breath or sweats. There are no associated agents to hypertension. There are no known risk factors for coronary artery disease. Past treatments include ACE inhibitors, diuretics and calcium channel blockers. There are no compliance problems.  There is no history of angina, kidney disease, CAD/MI, CVA, heart failure, left ventricular hypertrophy, PVD or retinopathy. There is no history of chronic renal disease, a hypertension causing med or renovascular disease.  Hyperlipidemia  This is a chronic problem. The current episode started more than 1 year ago. Recent lipid tests were reviewed and are normal. He has no history of chronic renal disease. Factors aggravating his hyperlipidemia include thiazides. Pertinent negatives include no chest pain, focal sensory loss, focal weakness, leg pain, myalgias or shortness of breath. Current antihyperlipidemic treatment includes statins. The current treatment provides moderate improvement of lipids. There are no compliance problems.  Risk factors for coronary artery disease include hypertension.  Diabetes  He presents for his follow-up diabetic visit. Pertinent negatives for hypoglycemia include no dizziness, headaches, nervousness/anxiousness or sweats. Pertinent negatives for diabetes include no blurred vision, no chest pain, no polydipsia and no weight loss. Pertinent negatives for diabetic complications include no CVA, PVD or  retinopathy. There are no known risk factors for coronary artery disease. Current diabetic treatment includes oral agent (monotherapy). He is compliant with treatment some of the time. His weight is stable. He is following a generally healthy diet. He participates in exercise daily. There is no change in his home blood glucose trend. An ACE inhibitor/angiotensin II receptor blocker is being taken. He does not see a podiatrist.Eye exam is not current.  Depression         This is a chronic problem.  The current episode started more than 1 month ago.   The onset quality is gradual.   The problem occurs intermittently.  The problem has been gradually improving since onset.  Associated symptoms include no decreased concentration, no helplessness, no hopelessness, does not have insomnia, not irritable, no restlessness, no decreased interest, no myalgias, no headaches, no indigestion, not sad and no suicidal ideas.  Past treatments include SNRIs - Serotonin and norepinephrine reuptake inhibitors.  Previous treatment provided mild relief.   Pertinent negatives include no anxiety.   No problem-specific Assessment & Plan notes found for this encounter.   Past Medical History:  Diagnosis Date  . Diabetes mellitus without complication ([place])   . Hyperlipidemia   . Hypertension     Past Surgical History:  Procedure Laterality Date  . NASAL SINUS SURGERY      No family history on file.  Social History   Social History  . Marital status: Married    Spouse name: N/A  . Number of children: N/A  . Years of education: N/A   Occupational History  . Not on file.   Social History Main Topics  . Smoking status: Never Smoker  . Smokeless tobacco: Never Used  . Alcohol use 0.0 oz/week  .  Drug use: No  . Sexual activity: Yes   Other Topics Concern  . Not on file   Social History Narrative  . No narrative on file    No Known Allergies  Outpatient Medications Prior to Visit  Medication Sig  Dispense Refill  . etodolac (LODINE) 500 MG tablet Take 1 tablet (500 mg total) by mouth 2 (two) times daily. 60 tablet 0  . amLODipine (NORVASC) 10 MG tablet TAKE 1 TABLET DAILY 90 tablet 0  . glipiZIDE (GLUCOTROL XL) 2.5 MG 24 hr tablet Take 1 tablet (2.5 mg total) by mouth daily. 90 tablet 0  . lisinopril-hydrochlorothiazide (PRINZIDE,ZESTORETIC) 20-12.5 MG tablet TAKE 1 TABLET DAILY 90 tablet 0  . metFORMIN (GLUCOPHAGE) 1000 MG tablet Take 1 tablet (1,000 mg total) by mouth 2 (two) times daily. 180 tablet 1  . venlafaxine XR (EFFEXOR-XR) 75 MG 24 hr capsule TAKE 1 CAPSULE DAILY 90 capsule 0  . lovastatin (MEVACOR) 20 MG tablet Take 1 tablet (20 mg total) by mouth daily. (Patient not taking: Reported on 10/05/2016) 90 tablet 1   No facility-administered medications prior to visit.     Review of Systems  Constitutional: Negative for chills, fever, malaise/fatigue and weight loss.  HENT: Negative for ear discharge, ear pain and sore throat.   Eyes: Negative for blurred vision.  Respiratory: Negative for cough, sputum production, shortness of breath and wheezing.   Cardiovascular: Negative for chest pain, palpitations, orthopnea, leg swelling and PND.  Gastrointestinal: Negative for abdominal pain, blood in stool, constipation, diarrhea, heartburn, melena and nausea.  Genitourinary: Negative for dysuria, frequency, hematuria and urgency.  Musculoskeletal: Negative for back pain, joint pain, myalgias and neck pain.  Skin: Negative for rash.  Neurological: Negative for dizziness, tingling, sensory change, focal weakness and headaches.  Endo/Heme/Allergies: Negative for environmental allergies and polydipsia. Does not bruise/bleed easily.  Psychiatric/Behavioral: Positive for depression. Negative for decreased concentration and suicidal ideas. The patient is not nervous/anxious and does not have insomnia.      Objective  Vitals:   10/05/16 1356  BP: 138/80  Pulse: (!) 56  Weight: 194 lb  (88 kg)  Height: 6\' 1"  (1.854 m)    Physical Exam  Constitutional: He is oriented to person, place, and time and well-developed, well-nourished, and in no distress. He is not irritable.  HENT:  Head: Normocephalic.  Right Ear: External ear normal.  Left Ear: External ear normal.  Nose: Nose normal.  Mouth/Throat: Oropharynx is clear and moist.  Eyes: Conjunctivae and EOM are normal. Pupils are equal, round, and reactive to light. Right eye exhibits no discharge. Left eye exhibits no discharge. No scleral icterus.  Neck: Normal range of motion. Neck supple. No JVD present. No tracheal deviation present. No thyromegaly present.  Cardiovascular: Normal rate, regular rhythm, normal heart sounds and intact distal pulses.  Exam reveals no gallop and no friction rub.   No murmur heard. Pulmonary/Chest: Breath sounds normal. No respiratory distress. He has no wheezes. He has no rales.  Abdominal: Soft. Bowel sounds are normal. He exhibits no mass. There is no hepatosplenomegaly. There is no tenderness. There is no rebound, no guarding and no CVA tenderness.  Musculoskeletal: Normal range of motion. He exhibits no edema or tenderness.  Lymphadenopathy:    He has no cervical adenopathy.  Neurological: He is alert and oriented to person, place, and time. He has normal sensation, normal strength, normal reflexes and intact cranial nerves. No cranial nerve deficit.  Skin: Skin is warm. No rash noted.  Psychiatric: Mood and affect normal.  Nursing note and vitals reviewed.     Assessment & Plan  Problem List Items Addressed This Visit      Cardiovascular and Mediastinum   Essential (primary) hypertension   Relevant Medications   amLODipine (NORVASC) 10 MG tablet   lisinopril-hydrochlorothiazide (PRINZIDE,ZESTORETIC) 20-12.5 MG tablet   Other Relevant Orders   Renal Function Panel     Endocrine   Diabetes mellitus, type 2 (HCC)   Relevant Medications   glipiZIDE (GLUCOTROL XL) 2.5 MG 24  hr tablet   metFORMIN (GLUCOPHAGE) 1000 MG tablet   lisinopril-hydrochlorothiazide (PRINZIDE,ZESTORETIC) 20-12.5 MG tablet   Other Relevant Orders   Hemoglobin A1c     Other   Recurrent major depressive episodes (HCC)   Relevant Medications   venlafaxine XR (EFFEXOR-XR) 75 MG 24 hr capsule   Mixed hyperlipidemia - Primary   Relevant Medications   amLODipine (NORVASC) 10 MG tablet   lisinopril-hydrochlorothiazide (PRINZIDE,ZESTORETIC) 20-12.5 MG tablet   Other Relevant Orders   Lipid panel      Meds ordered this encounter  Medications  . glipiZIDE (GLUCOTROL XL) 2.5 MG 24 hr tablet    Sig: Take 1 tablet (2.5 mg total) by mouth daily.    Dispense:  90 tablet    Refill:  1  . metFORMIN (GLUCOPHAGE) 1000 MG tablet    Sig: Take 1 tablet (1,000 mg total) by mouth 2 (two) times daily.    Dispense:  180 tablet    Refill:  1  . venlafaxine XR (EFFEXOR-XR) 75 MG 24 hr capsule    Sig: Take 1 capsule (75 mg total) by mouth daily.    Dispense:  90 capsule    Refill:  1  . amLODipine (NORVASC) 10 MG tablet    Sig: Take 1 tablet (10 mg total) by mouth daily.    Dispense:  90 tablet    Refill:  1  . lisinopril-hydrochlorothiazide (PRINZIDE,ZESTORETIC) 20-12.5 MG tablet    Sig: Take 1 tablet by mouth daily.    Dispense:  90 tablet    Refill:  1      Dr. [name] [facility]  10/05/16

## 2016-10-06 LAB — HEMOGLOBIN A1C
ESTIMATED AVERAGE GLUCOSE: 117 mg/dL
Hgb A1c MFr Bld: 5.7 % — ABNORMAL HIGH (ref 4.8–5.6)

## 2016-10-06 LAB — RENAL FUNCTION PANEL
Albumin: 4.7 g/dL (ref 3.6–4.8)
BUN/Creatinine Ratio: 16 (ref 10–24)
BUN: 17 mg/dL (ref 8–27)
CO2: 26 mmol/L (ref 20–29)
CREATININE: 1.05 mg/dL (ref 0.76–1.27)
Calcium: 9.8 mg/dL (ref 8.6–10.2)
Chloride: 98 mmol/L (ref 96–106)
GFR, EST AFRICAN AMERICAN: 86 mL/min/{1.73_m2} (ref >59)
GFR, EST NON AFRICAN AMERICAN: 74 mL/min/{1.73_m2} (ref >59)
GLUCOSE: 82 mg/dL (ref 65–99)
PHOSPHORUS: 3 mg/dL (ref 2.5–4.5)
Potassium: 4.1 mmol/L (ref 3.5–5.2)
SODIUM: 142 mmol/L (ref 134–144)

## 2016-10-06 LAB — LIPID PANEL
CHOL/HDL RATIO: 4.3 ratio (ref 0.0–5.0)
Cholesterol, Total: 196 mg/dL (ref 100–199)
HDL: 46 mg/dL (ref >39)
LDL Calculated: 122 mg/dL — ABNORMAL HIGH (ref 0–99)
TRIGLYCERIDES: 141 mg/dL (ref 0–149)
VLDL Cholesterol Cal: 28 mg/dL (ref 5–40)

## 2016-10-28 ENCOUNTER — Other Ambulatory Visit: Payer: Self-pay

## 2016-10-28 ENCOUNTER — Ambulatory Visit: Payer: PPO | Admitting: Family Medicine

## 2016-11-01 ENCOUNTER — Ambulatory Visit: Payer: PPO | Admitting: Family Medicine

## 2016-12-03 ENCOUNTER — Other Ambulatory Visit: Payer: Self-pay

## 2016-12-03 DIAGNOSIS — I10 Essential (primary) hypertension: Secondary | ICD-10-CM

## 2016-12-03 DIAGNOSIS — E119 Type 2 diabetes mellitus without complications: Secondary | ICD-10-CM

## 2016-12-03 DIAGNOSIS — F339 Major depressive disorder, recurrent, unspecified: Secondary | ICD-10-CM

## 2016-12-03 MED ORDER — LISINOPRIL-HYDROCHLOROTHIAZIDE 20-12.5 MG PO TABS: 1.0000 | ORAL_TABLET | Freq: Every day | ORAL | 0 refills | Status: DC

## 2016-12-03 MED ORDER — METFORMIN HCL 1000 MG PO TABS: 1000.0000 mg | ORAL_TABLET | Freq: Two times a day (BID) | ORAL | 0 refills | Status: DC

## 2016-12-03 MED ORDER — AMLODIPINE BESYLATE 10 MG PO TABS: 10.0000 mg | ORAL_TABLET | Freq: Every day | ORAL | 0 refills | Status: DC

## 2016-12-03 MED ORDER — GLIPIZIDE ER 2.5 MG PO TB24: 2.5000 mg | ORAL_TABLET | Freq: Every day | ORAL | 0 refills | Status: DC

## 2016-12-03 MED ORDER — VENLAFAXINE HCL ER 75 MG PO CP24: 75.0000 mg | ORAL_CAPSULE | Freq: Every day | ORAL | 0 refills | Status: DC

## 2016-12-21 ENCOUNTER — Other Ambulatory Visit: Payer: Self-pay | Admitting: Family Medicine

## 2016-12-21 DIAGNOSIS — I10 Essential (primary) hypertension: Secondary | ICD-10-CM

## 2016-12-21 DIAGNOSIS — F339 Major depressive disorder, recurrent, unspecified: Secondary | ICD-10-CM

## 2017-03-17 ENCOUNTER — Other Ambulatory Visit: Payer: Self-pay

## 2017-03-17 DIAGNOSIS — E119 Type 2 diabetes mellitus without complications: Secondary | ICD-10-CM

## 2017-03-17 MED ORDER — GLIPIZIDE ER 2.5 MG PO TB24: 2.5000 mg | ORAL_TABLET | Freq: Every day | ORAL | 0 refills | Status: DC

## 2017-03-24 ENCOUNTER — Telehealth: Payer: Self-pay | Admitting: Family Medicine

## 2017-03-24 NOTE — Telephone Encounter (Signed)
Called pt to sched for AWV with Nurse Health Advisor  please schedule AWV with [facility] any date after 04/05/17. C/b #  [phone] on Skype [email or web address] if you have questions

## 2017-04-05 ENCOUNTER — Other Ambulatory Visit: Payer: Self-pay

## 2017-04-05 DIAGNOSIS — E119 Type 2 diabetes mellitus without complications: Secondary | ICD-10-CM

## 2017-04-05 MED ORDER — GLIPIZIDE ER 2.5 MG PO TB24: 2.5000 mg | ORAL_TABLET | Freq: Every day | ORAL | 0 refills | Status: DC

## 2017-04-07 ENCOUNTER — Ambulatory Visit: Payer: PPO | Admitting: Family Medicine

## 2017-04-18 ENCOUNTER — Telehealth: Payer: Self-pay | Admitting: Family Medicine

## 2017-04-18 NOTE — Telephone Encounter (Signed)
Called to schedule AWV with Nurse Health Advisor. °Andrew [name] °[phone]  °Skype [name]  ° °

## 2017-04-21 ENCOUNTER — Other Ambulatory Visit: Payer: Self-pay

## 2017-04-21 DIAGNOSIS — E119 Type 2 diabetes mellitus without complications: Secondary | ICD-10-CM

## 2017-04-21 MED ORDER — GLIPIZIDE ER 2.5 MG PO TB24: 2.5000 mg | ORAL_TABLET | Freq: Every day | ORAL | 0 refills | Status: DC

## 2017-04-21 NOTE — Telephone Encounter (Signed)
Called pt to re-sched awv for 2/4

## 2017-04-28 ENCOUNTER — Ambulatory Visit (INDEPENDENT_AMBULATORY_CARE_PROVIDER_SITE_OTHER): Payer: PPO

## 2017-04-28 VITALS — BP 138/70 | HR 64 | Temp 98.4°F | Resp 12 | Ht 73.0 in | Wt 195.6 lb

## 2017-04-28 DIAGNOSIS — Z1159 Encounter for screening for other viral diseases: Secondary | ICD-10-CM

## 2017-04-28 DIAGNOSIS — Z0000 Encounter for general adult medical examination without abnormal findings: Secondary | ICD-10-CM

## 2017-04-28 DIAGNOSIS — Z114 Encounter for screening for human immunodeficiency virus [HIV]: Secondary | ICD-10-CM

## 2017-04-28 DIAGNOSIS — Z23 Encounter for immunization: Secondary | ICD-10-CM | POA: Diagnosis not present

## 2017-04-28 NOTE — Patient Instructions (Addendum)
[name] , Thank you for taking time to come for your Medicare Wellness Visit. I appreciate your ongoing commitment to your health goals. Please review the following plan we discussed and let me know if I can assist you in the future.   Screening recommendations/referrals: Colonoscopy: Completed 02/19/09. Repeat every 10 years. Recommended yearly ophthalmology/optometry visit for glaucoma screening and checkup Recommended yearly dental visit for hygiene and checkup  Vaccinations: Influenza vaccine: Completed today Pneumococcal vaccine: PCV13 given today Tdap vaccine: Declined. Please call your insurance company to determine your out of pocket expense. You may also receive this vaccine at your local pharmacy or Health Dept. Shingles vaccine: Please call your insurance company to determine your out of pocket expense for the Shingrix vaccine. You may also receive this vaccine at your local pharmacy or Health Dept.   Advanced directives: Advance directive discussed with you today. I have provided a copy for you to complete at home and have notarized. Once this is complete please bring a copy in to our office so we can scan it into your chart.  Conditions/risks identified: Recommend to drink at least 6-8 8oz glasses of water per day.  Next appointment: You are scheduled to see Dr. Ronnald Ramp on 05/06/17 @ 9:15am.   Please schedule your Annual Wellness Visit with your Nurse Health Advisor in one year.  Preventive Care 66 Years and Older, Male Preventive care refers to lifestyle choices and visits with your health care provider that can promote health and wellness. What does preventive care include?  A yearly physical exam. This is also called an annual well check.  Dental exams once or twice a year.  Routine eye exams. Ask your health care provider how often you should have your eyes checked.  Personal lifestyle choices, including:  Daily care of your teeth and gums.  Regular physical  activity.  Eating a healthy diet.  Avoiding tobacco and drug use.  Limiting alcohol use.  Practicing safe sex.  Taking low doses of aspirin every day.  Taking vitamin and mineral supplements as recommended by your health care provider. What happens during an annual well check? The services and screenings done by your health care provider during your annual well check will depend on your age, overall health, lifestyle risk factors, and family history of disease. Counseling  Your health care provider may ask you questions about your:  Alcohol use.  Tobacco use.  Drug use.  Emotional well-being.  Home and relationship well-being.  Sexual activity.  Eating habits.  History of falls.  Memory and ability to understand (cognition).  Work and work Statistician. Screening  You may have the following tests or measurements:  Height, weight, and BMI.  Blood pressure.  Lipid and cholesterol levels. These may be checked every 5 years, or more frequently if you are over 66 years old.  Skin check.  Lung cancer screening. You may have this screening every year starting at age 20 if you have a 30-pack-year history of smoking and currently smoke or have quit within the past 15 years.  Fecal occult blood test (FOBT) of the stool. You may have this test every year starting at age 65.  Flexible sigmoidoscopy or colonoscopy. You may have a sigmoidoscopy every 5 years or a colonoscopy every 10 years starting at age 21.  Prostate cancer screening. Recommendations will vary depending on your family history and other risks.  Hepatitis C blood test.  Hepatitis B blood test.  Sexually transmitted disease (STD) testing.  Diabetes screening.  This is done by checking your blood sugar (glucose) after you have not eaten for a while (fasting). You may have this done every 1-3 years.  Abdominal aortic aneurysm (AAA) screening. You may need this if you are a current or former  smoker.  Osteoporosis. You may be screened starting at age 81 if you are at high risk. Talk with your health care provider about your test results, treatment options, and if necessary, the need for more tests. Vaccines  Your health care provider may recommend certain vaccines, such as:  Influenza vaccine. This is recommended every year.  Tetanus, diphtheria, and acellular pertussis (Tdap, Td) vaccine. You may need a Td booster every 10 years.  Zoster vaccine. You may need this after age 60.  Pneumococcal 13-valent conjugate (PCV13) vaccine. One dose is recommended after age 78.  Pneumococcal polysaccharide (PPSV23) vaccine. One dose is recommended after age 106. Talk to your health care provider about which screenings and vaccines you need and how often you need them. This information is not intended to replace advice given to you by your health care provider. Make sure you discuss any questions you have with your health care provider. Document Released: 04/11/2015 Document Revised: 12/03/2015 Document Reviewed: 01/14/2015 Elsevier Interactive Patient Education  2017 [place] Prevention in the Home Falls can cause injuries. They can happen to people of all ages. There are many things you can do to make your home safe and to help prevent falls. What can I do on the outside of my home?  Regularly fix the edges of walkways and driveways and fix any cracks.  Remove anything that might make you trip as you walk through a door, such as a raised step or threshold.  Trim any bushes or trees on the path to your home.  Use bright outdoor lighting.  Clear any walking paths of anything that might make someone trip, such as rocks or tools.  Regularly check to see if handrails are loose or broken. Make sure that both sides of any steps have handrails.  Any raised decks and porches should have guardrails on the edges.  Have any leaves, snow, or ice cleared regularly.  Use sand or  salt on walking paths during winter.  Clean up any spills in your garage right away. This includes oil or grease spills. What can I do in the bathroom?  Use night lights.  Install grab bars by the toilet and in the tub and shower. Do not use towel bars as grab bars.  Use non-skid mats or decals in the tub or shower.  If you need to sit down in the shower, use a plastic, non-slip stool.  Keep the floor dry. Clean up any water that spills on the floor as soon as it happens.  Remove soap buildup in the tub or shower regularly.  Attach bath mats securely with double-sided non-slip rug tape.  Do not have throw rugs and other things on the floor that can make you trip. What can I do in the bedroom?  Use night lights.  Make sure that you have a light by your bed that is easy to reach.  Do not use any sheets or blankets that are too big for your bed. They should not hang down onto the floor.  Have a firm chair that has side arms. You can use this for support while you get dressed.  Do not have throw rugs and other things on the floor that can make you  trip. What can I do in the kitchen?  Clean up any spills right away.  Avoid walking on wet floors.  Keep items that you use a lot in easy-to-reach places.  If you need to reach something above you, use a strong step stool that has a grab bar.  Keep electrical cords out of the way.  Do not use floor polish or wax that makes floors slippery. If you must use wax, use non-skid floor wax.  Do not have throw rugs and other things on the floor that can make you trip. What can I do with my stairs?  Do not leave any items on the stairs.  Make sure that there are handrails on both sides of the stairs and use them. Fix handrails that are broken or loose. Make sure that handrails are as long as the stairways.  Check any carpeting to make sure that it is firmly attached to the stairs. Fix any carpet that is loose or worn.  Avoid having  throw rugs at the top or bottom of the stairs. If you do have throw rugs, attach them to the floor with carpet tape.  Make sure that you have a light switch at the top of the stairs and the bottom of the stairs. If you do not have them, ask someone to add them for you. What else can I do to help prevent falls?  Wear shoes that:  Do not have high heels.  Have rubber bottoms.  Are comfortable and fit you well.  Are closed at the toe. Do not wear sandals.  If you use a stepladder:  Make sure that it is fully opened. Do not climb a closed stepladder.  Make sure that both sides of the stepladder are locked into place.  Ask someone to hold it for you, if possible.  Clearly mark and make sure that you can see:  Any grab bars or handrails.  First and last steps.  Where the edge of each step is.  Use tools that help you move around (mobility aids) if they are needed. These include:  Canes.  Walkers.  Scooters.  Crutches.  Turn on the lights when you go into a dark area. Replace any light bulbs as soon as they burn out.  Set up your furniture so you have a clear path. Avoid moving your furniture around.  If any of your floors are uneven, fix them.  If there are any pets around you, be aware of where they are.  Review your medicines with your doctor. Some medicines can make you feel dizzy. This can increase your chance of falling. Ask your doctor what other things that you can do to help prevent falls. This information is not intended to replace advice given to you by your health care provider. Make sure you discuss any questions you have with your health care provider. Document Released: 01/09/2009 Document Revised: 08/21/2015 Document Reviewed: 04/19/2014 Elsevier Interactive Patient Education  2017 [place].  Influenza (Flu) Vaccine (Inactivated or Recombinant): What You Need to Know 1. Why get vaccinated? Influenza ("flu") is a contagious disease that spreads  around the [place] every year, usually between October and May. Flu is caused by influenza viruses, and is spread mainly by coughing, sneezing, and close contact. Anyone can get flu. Flu strikes suddenly and can last several days. Symptoms vary by age, but can include:  fever/chills  sore throat  muscle aches  fatigue  cough  headache  runny or stuffy nose  Flu can also lead to pneumonia and blood infections, and cause diarrhea and seizures in children. If you have a medical condition, such as heart or lung disease, flu can make it worse. Flu is more dangerous for some people. Infants and young children, people 20 years of age and older, pregnant women, and people with certain health conditions or a weakened immune system are at greatest risk. Each year thousands of people in the [place] die from flu, and many more are hospitalized. Flu vaccine can:  keep you from getting flu,  make flu less severe if you do get it, and  keep you from spreading flu to your family and other people. 2. Inactivated and recombinant flu vaccines A dose of flu vaccine is recommended every flu season. Children 6 months through 73 years of age may need two doses during the same flu season. Everyone else needs only one dose each flu season. Some inactivated flu vaccines contain a very small amount of a mercury-based preservative called thimerosal. Studies have not shown thimerosal in vaccines to be harmful, but flu vaccines that do not contain thimerosal are available. There is no live flu virus in flu shots. They cannot cause the flu. There are many flu viruses, and they are always changing. Each year a new flu vaccine is made to protect against three or four viruses that are likely to cause disease in the upcoming flu season. But even when the vaccine doesn't exactly match these viruses, it may still provide some protection. Flu vaccine cannot prevent:  flu that is caused by a virus not covered  by the vaccine, or  illnesses that look like flu but are not.  It takes about 2 weeks for protection to develop after vaccination, and protection lasts through the flu season. 3. Some people should not get this vaccine Tell the person who is giving you the vaccine:  If you have any severe, life-threatening allergies. If you ever had a life-threatening allergic reaction after a dose of flu vaccine, or have a severe allergy to any part of this vaccine, you may be advised not to get vaccinated. Most, but not all, types of flu vaccine contain a small amount of egg protein.  If you ever had Guillain-Barr Syndrome (also called GBS). Some people with a history of GBS should not get this vaccine. This should be discussed with your doctor.  If you are not feeling well. It is usually okay to get flu vaccine when you have a mild illness, but you might be asked to come back when you feel better.  4. Risks of a vaccine reaction With any medicine, including vaccines, there is a chance of reactions. These are usually mild and go away on their own, but serious reactions are also possible. Most people who get a flu shot do not have any problems with it. Minor problems following a flu shot include:  soreness, redness, or swelling where the shot was given  hoarseness  sore, red or itchy eyes  cough  fever  aches  headache  itching  fatigue  If these problems occur, they usually begin soon after the shot and last 1 or 2 days. More serious problems following a flu shot can include the following:  There may be a small increased risk of Guillain-Barre Syndrome (GBS) after inactivated flu vaccine. This risk has been estimated at 1 or 2 additional cases per million people vaccinated. This is much lower than the risk of severe complications from flu, which  can be prevented by flu vaccine.  Young children who get the flu shot along with pneumococcal vaccine (PCV13) and/or DTaP vaccine at the same  time might be slightly more likely to have a seizure caused by fever. Ask your doctor for more information. Tell your doctor if a child who is getting flu vaccine has ever had a seizure.  Problems that could happen after any injected vaccine:  People sometimes faint after a medical procedure, including vaccination. Sitting or lying down for about 15 minutes can help prevent fainting, and injuries caused by a fall. Tell your doctor if you feel dizzy, or have vision changes or ringing in the ears.  Some people get severe pain in the shoulder and have difficulty moving the arm where a shot was given. This happens very rarely.  Any medication can cause a severe allergic reaction. Such reactions from a vaccine are very rare, estimated at about 1 in a million doses, and would happen within a few minutes to a few hours after the vaccination. As with any medicine, there is a very remote chance of a vaccine causing a serious injury or death. The safety of vaccines is always being monitored. For more information, visit: http://www.aguilar.org/ 5. What if there is a serious reaction? What should I look for? Look for anything that concerns you, such as signs of a severe allergic reaction, very high fever, or unusual behavior. Signs of a severe allergic reaction can include hives, swelling of the face and throat, difficulty breathing, a fast heartbeat, dizziness, and weakness. These would start a few minutes to a few hours after the vaccination. What should I do?  If you think it is a severe allergic reaction or other emergency that can't wait, call 9-1-1 and get the person to the nearest hospital. Otherwise, call your doctor.  Reactions should be reported to the Vaccine Adverse Event Reporting System (VAERS). Your doctor should file this report, or you can do it yourself through the VAERS web site at www.vaers.SamedayNews.es, or by calling (301)663-0223. ? VAERS does not give medical advice. 6. The National  Vaccine Injury Compensation Program The [employer] Vaccine Injury Compensation Program (VICP) is a federal program that was created to compensate people who may have been injured by certain vaccines. Persons who believe they may have been injured by a vaccine can learn about the program and about filing a claim by calling 405-506-7459 or visiting the [place] website at GoldCloset.com.ee. There is a time limit to file a claim for compensation. 7. How can I learn more?  Ask your healthcare provider. He or she can give you the vaccine package insert or suggest other sources of information.  Call your local or state health department.  Contact the Centers for Disease Control and Prevention (CDC): ? Call 803-289-2463 (1-800-CDC-INFO) or ? Visit CDC's website at https://gibson.com/ Vaccine Information Statement, Inactivated Influenza Vaccine (11/02/2013) This information is not intended to replace advice given to you by your health care provider. Make sure you discuss any questions you have with your health care provider. Document Released: 01/07/2006 Document Revised: 12/04/2015 Document Reviewed: 12/04/2015 Elsevier Interactive Patient Education  2017 [employer]  Pneumococcal Conjugate Vaccine (PCV13) What You Need to Know 1. Why get vaccinated? Vaccination can protect both children and adults from pneumococcal disease. Pneumococcal disease is caused by bacteria that can spread from person to person through close contact. It can cause ear infections, and it can also lead to more serious infections of the:  Lungs (pneumonia),  Blood (  bacteremia), and  Covering of the brain and spinal cord (meningitis).  Pneumococcal pneumonia is most common among adults. Pneumococcal meningitis can cause deafness and brain damage, and it kills about 1 child in 10 who get it. Anyone can get pneumococcal disease, but children under 63 years of age and adults 62 years and older, people with certain  medical conditions, and cigarette smokers are at the highest risk. Before there was a vaccine, the [place] saw:  more than 700 cases of meningitis,  about 13,000 blood infections,  about 5 million ear infections, and  about 200 deaths  in children under 5 each year from pneumococcal disease. Since vaccine became available, severe pneumococcal disease in these children has fallen by 88%. About 18,000 older adults die of pneumococcal disease each year in the [place]. Treatment of pneumococcal infections with penicillin and other drugs is not as effective as it used to be, because some strains of the disease have become resistant to these drugs. This makes prevention of the disease, through vaccination, even more important. 2. PCV13 vaccine Pneumococcal conjugate vaccine (called PCV13) protects against 13 types of pneumococcal bacteria. PCV13 is routinely given to children at 2, 4, 6, and 68-66 months of age. It is also recommended for children and adults 77 to 40 years of age with certain health conditions, and for all adults 76 years of age and older. Your doctor can give you details. 3. Some people should not get this vaccine Anyone who has ever had a life-threatening allergic reaction to a dose of this vaccine, to an earlier pneumococcal vaccine called PCV7, or to any vaccine containing diphtheria toxoid (for example, DTaP), should not get PCV13. Anyone with a severe allergy to any component of PCV13 should not get the vaccine. Tell your doctor if the person being vaccinated has any severe allergies. If the person scheduled for vaccination is not feeling well, your healthcare provider might decide to reschedule the shot on another day. 4. Risks of a vaccine reaction With any medicine, including vaccines, there is a chance of reactions. These are usually mild and go away on their own, but serious reactions are also possible. Problems reported following PCV13 varied by age and dose  in the series. The most common problems reported among children were:  About half became drowsy after the shot, had a temporary loss of appetite, or had redness or tenderness where the shot was given.  About 1 out of 3 had swelling where the shot was given.  About 1 out of 3 had a mild fever, and about 1 in 20 had a fever over 102.34F.  Up to about 8 out of 10 became fussy or irritable.  Adults have reported pain, redness, and swelling where the shot was given; also mild fever, fatigue, headache, chills, or muscle pain. Young children who get PCV13 along with inactivated flu vaccine at the same time may be at increased risk for seizures caused by fever. Ask your doctor for more information. Problems that could happen after any vaccine:  People sometimes faint after a medical procedure, including vaccination. Sitting or lying down for about 15 minutes can help prevent fainting, and injuries caused by a fall. Tell your doctor if you feel dizzy, or have vision changes or ringing in the ears.  Some older children and adults get severe pain in the shoulder and have difficulty moving the arm where a shot was given. This happens very rarely.  Any medication can cause a severe allergic reaction.  Such reactions from a vaccine are very rare, estimated at about 1 in a million doses, and would happen within a few minutes to a few hours after the vaccination. As with any medicine, there is a very small chance of a vaccine causing a serious injury or death. The safety of vaccines is always being monitored. For more information, visit: http://www.aguilar.org/ 5. What if there is a serious reaction? What should I look for? Look for anything that concerns you, such as signs of a severe allergic reaction, very high fever, or unusual behavior. Signs of a severe allergic reaction can include hives, swelling of the face and throat, difficulty breathing, a fast heartbeat, dizziness, and weakness-usually within  a few minutes to a few hours after the vaccination. What should I do?  If you think it is a severe allergic reaction or other emergency that can't wait, call 9-1-1 or get the person to the nearest hospital. Otherwise, call your doctor.  Reactions should be reported to the Vaccine Adverse Event Reporting System (VAERS). Your doctor should file this report, or you can do it yourself through the VAERS web site at www.vaers.SamedayNews.es, or by calling (843) 348-5027. ? VAERS does not give medical advice. 6. The National Vaccine Injury Compensation Program The [employer] Vaccine Injury Compensation Program (VICP) is a federal program that was created to compensate people who may have been injured by certain vaccines. Persons who believe they may have been injured by a vaccine can learn about the program and about filing a claim by calling 971-294-2638 or visiting the [place] website at GoldCloset.com.ee. There is a time limit to file a claim for compensation. 7. How can I learn more?  Ask your healthcare provider. He or she can give you the vaccine package insert or suggest other sources of information.  Call your local or state health department.  Contact the Centers for Disease Control and Prevention (CDC): ? Call (505) 073-7081 (1-800-CDC-INFO) or ? Visit CDC's website at http://hunter.com/ Vaccine Information Statement, PCV13 Vaccine (01/31/2014) This information is not intended to replace advice given to you by your health care provider. Make sure you discuss any questions you have with your health care provider. Document Released: 01/10/2006 Document Revised: 12/04/2015 Document Reviewed: 12/04/2015 Elsevier Interactive Patient Education  2017 [employer].

## 2017-04-28 NOTE — Progress Notes (Signed)
Subjective:   Andrew [name] is a 67 y.o. male who presents for an Initial Medicare Annual Wellness Visit.  Review of Systems  N/A Cardiac Risk Factors include: advanced age (>76men, >14 women);diabetes mellitus;male gender;hypertension;dyslipidemia;sedentary lifestyle    Objective:    Today's Vitals   04/28/17 1423  BP: 138/70  Pulse: 64  Resp: 12  Temp: 98.4 F (36.9 C)  TempSrc: Oral  Weight: 195 lb 9.6 oz (88.7 kg)  Height: 6\' 1"  (1.854 m)   Body mass index is 25.81 kg/m.  Advanced Directives 04/28/2017  Does Patient Have a Medical Advance Directive? No  Would patient like information on creating a medical advance directive? Yes (MAU/Ambulatory/Procedural Areas - Information given)    Current Medications (verified) Outpatient Encounter Medications as of 04/28/2017  Medication Sig  . amLODipine (NORVASC) 10 MG tablet TAKE 1 TABLET DAILY  . etodolac (LODINE) 500 MG tablet Take 1 tablet (500 mg total) by mouth 2 (two) times daily.  Marland Kitchen glipiZIDE (GLUCOTROL XL) 2.5 MG 24 hr tablet Take 1 tablet (2.5 mg total) by mouth daily with breakfast.  . lisinopril-hydrochlorothiazide (PRINZIDE,ZESTORETIC) 20-12.5 MG tablet TAKE 1 TABLET DAILY  . metFORMIN (GLUCOPHAGE) 1000 MG tablet Take 1 tablet (1,000 mg total) by mouth 2 (two) times daily.  Marland Kitchen venlafaxine XR (EFFEXOR-XR) 75 MG 24 hr capsule TAKE 1 CAPSULE DAILY   No facility-administered encounter medications on file as of 04/28/2017.     Allergies (verified) Patient has no known allergies.   History: Past Medical History:  Diagnosis Date  . Diabetes mellitus without complication ([place])   . Hyperlipidemia   . Hypertension    Past Surgical History:  Procedure Laterality Date  . NASAL SINUS SURGERY     Family History  Problem Relation Age of Onset  . Heart disease Mother   . Healthy Father    Social History   Socioeconomic History  . Marital status: Married    Spouse name: None  . Number of children: 1  . Years  of education: None  . Highest education level: Associate degree: academic program  Social Needs  . Financial resource strain: Not hard at all  . Food insecurity - worry: Never true  . Food insecurity - inability: Never true  . Transportation needs - medical: No  . Transportation needs - non-medical: No  Occupational History  . Occupation: Retired  Tobacco Use  . Smoking status: Never Smoker  . Smokeless tobacco: Never Used  . Tobacco comment: smoking cessation materials not required  Substance and Sexual Activity  . Alcohol use: No    Alcohol/week: 0.0 oz    Frequency: Never  . Drug use: No  . Sexual activity: Not Currently  Other Topics Concern  . None  Social History Narrative  . None   Tobacco Counseling Counseling given: No Comment: smoking cessation materials not required   Clinical Intake:  Pre-visit preparation completed: Yes  Pain : No/denies pain   BMI - recorded: 25.81 Nutritional Status: BMI 25 -29 Overweight Nutritional Risks: None Diabetes: Yes CBG done?: No Did pt. bring in CBG monitor from home?: No  How often do you need to have someone help you when you read instructions, pamphlets, or other written materials from your doctor or pharmacy?: 1 - Never  Interpreter Needed?: No  Information entered by :: AEversole, LPN  Activities of Daily Living In your present state of health, do you have any difficulty performing the following activities: 04/28/2017  Hearing? N  Comment denies wearing hearing  aids  Vision? N  Comment wears eyeglasses  Difficulty concentrating or making decisions? N  Walking or climbing stairs? N  Dressing or bathing? N  Doing errands, shopping? N  Preparing Food and eating ? N  Comment denies wearing dentures  Using the Toilet? N  In the past six months, have you accidently leaked urine? N  Do you have problems with loss of bowel control? N  Managing your Medications? N  Managing your Finances? N  Housekeeping or  managing your Housekeeping? N  Some recent data might be hidden     Immunizations and Health Maintenance Immunization History  Administered Date(s) Administered  . Influenza, High Dose Seasonal PF 04/28/2017  . Influenza,inj,Quad PF,6+ Mos 02/17/2015  . Pneumococcal Conjugate-13 04/28/2017   Health Maintenance Due  Topic Date Due  . Hepatitis C Screening  07-Nov-1950  . FOOT EXAM  04/05/1961  . OPHTHALMOLOGY EXAM  04/05/1961  . TETANUS/TDAP  04/05/1970  . PNA vac Low Risk Adult (1 of 2 - PCV13) 04/05/2016  . INFLUENZA VACCINE  10/27/2016  . HEMOGLOBIN A1C  04/07/2017    Patient Care Team: Juline Patch, MD as PCP - General (Family Medicine)  Indicate any recent Medical Services you may have received from other than [facility] providers in the past year (date may be approximate).    Assessment:   This is a routine wellness examination for Andrew [name].  Hearing/Vision screen Vision Screening Comments: [place] for annual eye exams  Dietary issues and exercise activities discussed: Current Exercise Habits: The patient does not participate in regular exercise at present, Exercise limited by: None identified  Goals    . DIET - INCREASE WATER INTAKE     Recommend to drink at least 6-8 8oz glasses of water per day.      Depression Screen PHQ 2/9 Scores 04/28/2017 10/05/2016 08/07/2015  PHQ - 2 Score 0 0 0    Fall Risk Fall Risk  04/28/2017 10/05/2016 08/07/2015  Falls in the past year? No No No    Is the patient's home free of loose throw rugs in walkways, pet beds, electrical cords, etc?   Yes Does the patient have any grab bars in the bathroom? No  Does the patient use a shower chair when bathing? Yes Does the patient have any stairs in or around the home? Yes If so, are there any handrails?  Yes Does the patient have adequate lighting?  Yes Does the patient use a cane, walker or w/c? No Does the patient use of an elevated toilet seat? Yes  Timed Get Up and Go  Performed: Yes. Pt ambulated 10 feet within 7 sec. Gait stead-fast and without the use of an assistive device. No intervention required at this time. Fall risk prevention has been discussed.  Pt declined my offer to send Community Resource Referral to Care Guide for installation of grab bars in the shower.   Cognitive Function:     6CIT Screen 04/28/2017  What Year? 0 points  What month? 0 points  What time? 0 points  Count back from 20 0 points  Months in reverse 0 points  Repeat phrase 0 points  Total Score 0    Screening Tests Health Maintenance  Topic Date Due  . Hepatitis C Screening  1950/07/31  . FOOT EXAM  04/05/1961  . OPHTHALMOLOGY EXAM  04/05/1961  . TETANUS/TDAP  04/05/1970  . PNA vac Low Risk Adult (1 of 2 - PCV13) 04/05/2016  . INFLUENZA VACCINE  10/27/2016  .  HEMOGLOBIN A1C  04/07/2017  . COLONOSCOPY  02/20/2019    Qualifies for Shingles Vaccine? Yes. Due for Zostavax or Shingrix vaccine. Education has been provided regarding the importance of this vaccine. Pt has been advised to call his insurance company to determine his out of pocket expense. Advised he may also receive this vaccine at his local pharmacy or Health Dept. Verbalized acceptance and understanding.  Due for Tdap and vaccine. Declined my offer to administer today. Education has been provided regarding the importance of this vaccine but still declined. Pt has been advised to call his insurance company to determine his out of pocket expense. Advised he may also receive this vaccine at his local pharmacy or Health Dept. Verbalized acceptance and understanding.  Cancer Screenings: Lung: Low Dose CT Chest recommended if Age 67-80 years, 30 pack-year currently smoking OR have quit w/in 15years. Patient does not qualify. NON-SMOKER Colorectal: Completed colonoscopy 02/19/09. Repeat every 10 years.  Additional Screenings: Hepatitis B Screening: Ordered today. Pt requested to completed during next  OV Hepatitis C Screening: Ordered today. Pt requested to completed during next OV     Plan:  I have personally reviewed and addressed the Medicare Annual Wellness questionnaire and have noted the following in the patient's chart:  A. Medical and social history B. Use of alcohol, tobacco or illicit drugs  C. Current medications and supplements D. Functional ability and status E.  Nutritional status F.  Physical activity G. Advance directives H. List of other physicians I.  Hospitalizations, surgeries, and ER visits in previous 12 months J.  [place] such as hearing and vision if needed, cognitive and depression L. Referrals and appointments - none  In addition, I have reviewed and discussed with patient certain preventive protocols, quality metrics, and best practice recommendations. A written personalized care plan for preventive services as well as general preventive health recommendations were provided to patient.  Signed,  Aleatha Borer, LPN Nurse Health Advisor  MD Recommendations: Due for Zostavax or Shingrix vaccine. Education has been provided regarding the importance of this vaccine. Pt has been advised to call his insurance company to determine his out of pocket expense. Advised he may also receive this vaccine at his local pharmacy or Health Dept. Verbalized acceptance and understanding.  Due for Tdap and vaccine. Declined my offer to administer today. Education has been provided regarding the importance of this vaccine but still declined. Pt has been advised to call his insurance company to determine his out of pocket expense. Advised he may also receive this vaccine at his local pharmacy or Health Dept. Verbalized acceptance and understanding.  Due for Hepatitis B Screening. Ordered today. Pt requested to complete during next OV. Pt was not provided with a copy of his lab req. Advised this will be printed for completion at his next OV. Verbalized acceptance and  understanding.  Due for Hepatitis C Screening. Ordered today. Pt requested to complete during next OV. Pt was not provided with a copy of his lab req. Advised this will be printed for completion at his next OV. Verbalized acceptance and understanding.

## 2017-05-02 ENCOUNTER — Ambulatory Visit: Payer: PPO

## 2017-05-06 ENCOUNTER — Ambulatory Visit (INDEPENDENT_AMBULATORY_CARE_PROVIDER_SITE_OTHER): Payer: PPO | Admitting: Family Medicine

## 2017-05-06 ENCOUNTER — Encounter: Payer: Self-pay | Admitting: Family Medicine

## 2017-05-06 VITALS — BP 124/80 | HR 72 | Ht 73.0 in | Wt 191.0 lb

## 2017-05-06 DIAGNOSIS — F339 Major depressive disorder, recurrent, unspecified: Secondary | ICD-10-CM | POA: Diagnosis not present

## 2017-05-06 DIAGNOSIS — S61231A Puncture wound without foreign body of left index finger without damage to nail, initial encounter: Secondary | ICD-10-CM | POA: Diagnosis not present

## 2017-05-06 DIAGNOSIS — I10 Essential (primary) hypertension: Secondary | ICD-10-CM | POA: Diagnosis not present

## 2017-05-06 DIAGNOSIS — E781 Pure hyperglyceridemia: Secondary | ICD-10-CM | POA: Diagnosis not present

## 2017-05-06 DIAGNOSIS — E119 Type 2 diabetes mellitus without complications: Secondary | ICD-10-CM

## 2017-05-06 DIAGNOSIS — S0081XA Abrasion of other part of head, initial encounter: Secondary | ICD-10-CM

## 2017-05-06 DIAGNOSIS — Z23 Encounter for immunization: Secondary | ICD-10-CM

## 2017-05-06 MED ORDER — LISINOPRIL-HYDROCHLOROTHIAZIDE 20-12.5 MG PO TABS: 1.0000 | ORAL_TABLET | Freq: Every day | ORAL | 1 refills | Status: DC

## 2017-05-06 MED ORDER — GLIPIZIDE ER 2.5 MG PO TB24: 2.5000 mg | ORAL_TABLET | Freq: Every day | ORAL | 1 refills | Status: DC

## 2017-05-06 MED ORDER — AMLODIPINE BESYLATE 10 MG PO TABS: 10.0000 mg | ORAL_TABLET | Freq: Every day | ORAL | 1 refills | Status: DC

## 2017-05-06 MED ORDER — ETODOLAC 500 MG PO TABS: 500.0000 mg | ORAL_TABLET | Freq: Two times a day (BID) | ORAL | 1 refills | Status: DC

## 2017-05-06 MED ORDER — METFORMIN HCL 1000 MG PO TABS: 1000.0000 mg | ORAL_TABLET | Freq: Two times a day (BID) | ORAL | 0 refills | Status: DC

## 2017-05-06 MED ORDER — VENLAFAXINE HCL ER 75 MG PO CP24: 75.0000 mg | ORAL_CAPSULE | Freq: Every day | ORAL | 1 refills | Status: DC

## 2017-05-06 NOTE — Progress Notes (Signed)
Name: Andrew [name]   MRN: [id number]    DOB: [birth year]   Date:05/06/2017       Progress Note  Subjective  Chief Complaint  Chief Complaint  Patient presents with  . Depression  . Hypertension  . Diabetes  . tdap needed    drilled hole in L) pointer finger on 04/05/17- and bit hit him on L) cheek on 05/02/17 puncturing L) cheek    Drill puncture/left index drill bit broke abrasion to left cheek   Depression       The patient presents with depression.  This is a chronic problem.  The current episode started more than 1 year ago.   The onset quality is gradual.   The problem occurs intermittently.  The problem has been gradually improving since onset.  Associated symptoms include no decreased concentration, no fatigue, no helplessness, no hopelessness, does not have insomnia, not irritable, no restlessness, no decreased interest, no appetite change, no body aches, no myalgias, no headaches, no indigestion, not sad and no suicidal ideas.     The symptoms are aggravated by nothing.  Past treatments include SNRIs - Serotonin and norepinephrine reuptake inhibitors.  Compliance with treatment is good.  Previous treatment provided moderate relief.  Past medical history includes depression.     Pertinent negatives include no anxiety. Hypertension  This is a chronic problem. The current episode started more than 1 year ago. The problem has been waxing and waning since onset. The problem is controlled. Pertinent negatives include no anxiety, blurred vision, chest pain, headaches, malaise/fatigue, neck pain, orthopnea, palpitations, peripheral edema, PND, shortness of breath or sweats. There are no associated agents to hypertension. Risk factors for coronary artery disease include diabetes mellitus and dyslipidemia. Past treatments include angiotensin blockers, calcium channel blockers and diuretics. The current treatment provides moderate improvement. There are no compliance problems.  There is no history of  angina, kidney disease, CAD/MI, CVA, heart failure, left ventricular hypertrophy, PVD or retinopathy. There is no history of chronic renal disease, a hypertension causing med or renovascular disease.  Diabetes  He presents for his follow-up diabetic visit. He has type 2 diabetes mellitus. His disease course has been stable. Pertinent negatives for hypoglycemia include no dizziness, headaches, nervousness/anxiousness or sweats. Pertinent negatives for diabetes include no blurred vision, no chest pain, no fatigue, no polydipsia, no polyphagia, no polyuria, no visual change and no weight loss. Symptoms are stable. Pertinent negatives for diabetic complications include no CVA, PVD or retinopathy. Risk factors for coronary artery disease include dyslipidemia, diabetes mellitus and hypertension. Current diabetic treatment includes oral agent (dual therapy). His weight is stable. He is following a generally healthy diet. Meal planning includes avoidance of concentrated sweets. He participates in exercise daily. There is no change in his home blood glucose trend. His breakfast blood glucose is taken between 7-8 am. His breakfast blood glucose range is generally 110-130 mg/dl. An ACE inhibitor/angiotensin II receptor blocker is being taken.  Hyperlipidemia  This is a chronic problem. The current episode started more than 1 year ago. The problem is controlled. Recent lipid tests were reviewed and are normal. He has no history of chronic renal disease. Pertinent negatives include no chest pain, focal weakness, myalgias or shortness of breath. Current antihyperlipidemic treatment includes diet change. The current treatment provides moderate improvement of lipids. There are no compliance problems.  Risk factors for coronary artery disease include hypertension and dyslipidemia.    No problem-specific Assessment & Plan notes found for this encounter.  Past Medical History:  Diagnosis Date  . Diabetes mellitus without  complication ([place])   . Hyperlipidemia   . Hypertension     Past Surgical History:  Procedure Laterality Date  . NASAL SINUS SURGERY      Family History  Problem Relation Age of Onset  . Heart disease Mother   . Healthy Father     Social History   Socioeconomic History  . Marital status: Married    Spouse name: Not on file  . Number of children: 1  . Years of education: Not on file  . Highest education level: Associate degree: academic program  Social Needs  . Financial resource strain: Not hard at all  . Food insecurity - worry: Never true  . Food insecurity - inability: Never true  . Transportation needs - medical: No  . Transportation needs - non-medical: No  Occupational History  . Occupation: Retired  Tobacco Use  . Smoking status: Never Smoker  . Smokeless tobacco: Never Used  . Tobacco comment: smoking cessation materials not required  Substance and Sexual Activity  . Alcohol use: No    Alcohol/week: 0.0 oz    Frequency: Never  . Drug use: No  . Sexual activity: Not Currently  Other Topics Concern  . Not on file  Social History Narrative  . Not on file    No Known Allergies  Outpatient Medications Prior to Visit  Medication Sig Dispense Refill  . amLODipine (NORVASC) 10 MG tablet TAKE 1 TABLET DAILY 90 tablet 0  . etodolac (LODINE) 500 MG tablet Take 1 tablet (500 mg total) by mouth 2 (two) times daily. 60 tablet 0  . glipiZIDE (GLUCOTROL XL) 2.5 MG 24 hr tablet Take 1 tablet (2.5 mg total) by mouth daily with breakfast. 14 tablet 0  . lisinopril-hydrochlorothiazide (PRINZIDE,ZESTORETIC) 20-12.5 MG tablet TAKE 1 TABLET DAILY 90 tablet 0  . metFORMIN (GLUCOPHAGE) 1000 MG tablet Take 1 tablet (1,000 mg total) by mouth 2 (two) times daily. 180 tablet 0  . venlafaxine XR (EFFEXOR-XR) 75 MG 24 hr capsule TAKE 1 CAPSULE DAILY 90 capsule 0   No facility-administered medications prior to visit.     Review of Systems  Constitutional: Negative for appetite  change, chills, fatigue, fever, malaise/fatigue and weight loss.  HENT: Negative for ear discharge, ear pain and sore throat.   Eyes: Negative for blurred vision.  Respiratory: Negative for cough, sputum production, shortness of breath and wheezing.   Cardiovascular: Negative for chest pain, palpitations, orthopnea, leg swelling and PND.  Gastrointestinal: Negative for abdominal pain, blood in stool, constipation, diarrhea, heartburn, melena and nausea.  Genitourinary: Negative for dysuria, frequency, hematuria and urgency.  Musculoskeletal: Negative for back pain, joint pain, myalgias and neck pain.  Skin: Negative for rash.  Neurological: Negative for dizziness, tingling, sensory change, focal weakness and headaches.  Endo/Heme/Allergies: Negative for environmental allergies, polydipsia and polyphagia. Does not bruise/bleed easily.  Psychiatric/Behavioral: Positive for depression. Negative for decreased concentration and suicidal ideas. The patient is not nervous/anxious and does not have insomnia.      Objective  Vitals:   05/06/17 0912  BP: 124/80  Pulse: 72  Weight: 191 lb (86.6 kg)  Height: 6\' 1"  (1.854 m)    Physical Exam  Constitutional: He is oriented to person, place, and time and well-developed, well-nourished, and in no distress. He is not irritable.  HENT:  Head: Normocephalic.  Right Ear: External ear normal.  Left Ear: External ear normal.  Nose: Nose normal.  Mouth/Throat: Oropharynx  is clear and moist.  Eyes: Conjunctivae and EOM are normal. Pupils are equal, round, and reactive to light. Right eye exhibits no discharge. Left eye exhibits no discharge. No scleral icterus.  Neck: Normal range of motion. Neck supple. No JVD present. No tracheal deviation present. No thyromegaly present.  Cardiovascular: Normal rate, regular rhythm, normal heart sounds and intact distal pulses. Exam reveals no gallop and no friction rub.  No murmur heard. Pulmonary/Chest: Breath  sounds normal. No respiratory distress. He has no wheezes. He has no rales.  Abdominal: Soft. Bowel sounds are normal. He exhibits no mass. There is no hepatosplenomegaly. There is no tenderness. There is no rebound, no guarding and no CVA tenderness.  Musculoskeletal: Normal range of motion. He exhibits no edema or tenderness.  Lymphadenopathy:    He has no cervical adenopathy.  Neurological: He is alert and oriented to person, place, and time. He has normal sensation, normal strength, normal reflexes and intact cranial nerves. No cranial nerve deficit.  Skin: Skin is warm. No rash noted.  Left index /facial puncture  Psychiatric: Mood and affect normal.  Nursing note and vitals reviewed.     Assessment & Plan  Problem List Items Addressed This Visit      Cardiovascular and Mediastinum   Essential (primary) hypertension   Relevant Medications   lisinopril-hydrochlorothiazide (PRINZIDE,ZESTORETIC) 20-12.5 MG tablet   amLODipine (NORVASC) 10 MG tablet   Other Relevant Orders   Renal function panel     Endocrine   Diabetes mellitus, type 2 (HCC) - Primary   Relevant Medications   lisinopril-hydrochlorothiazide (PRINZIDE,ZESTORETIC) 20-12.5 MG tablet   metFORMIN (GLUCOPHAGE) 1000 MG tablet   glipiZIDE (GLUCOTROL XL) 2.5 MG 24 hr tablet   Other Relevant Orders   Hemoglobin A1c     Other   Recurrent major depressive episodes (HCC)   Relevant Medications   venlafaxine XR (EFFEXOR-XR) 75 MG 24 hr capsule    Other Visit Diagnoses    Pure hyperglyceridemia       Relevant Medications   lisinopril-hydrochlorothiazide (PRINZIDE,ZESTORETIC) 20-12.5 MG tablet   amLODipine (NORVASC) 10 MG tablet   Other Relevant Orders   Lipid panel   Puncture wound of left index finger       04/05/17   Relevant Orders   Tdap vaccine greater than or equal to 7yo IM (Completed)   Facial abrasion, initial encounter       left cheek/04/05/17   Relevant Orders   Tdap vaccine greater than or equal to  7yo IM (Completed)      Meds ordered this encounter  Medications  . lisinopril-hydrochlorothiazide (PRINZIDE,ZESTORETIC) 20-12.5 MG tablet    Sig: Take 1 tablet by mouth daily.    Dispense:  90 tablet    Refill:  1  . amLODipine (NORVASC) 10 MG tablet    Sig: Take 1 tablet (10 mg total) by mouth daily.    Dispense:  90 tablet    Refill:  1  . venlafaxine XR (EFFEXOR-XR) 75 MG 24 hr capsule    Sig: Take 1 capsule (75 mg total) by mouth daily.    Dispense:  90 capsule    Refill:  1  . metFORMIN (GLUCOPHAGE) 1000 MG tablet    Sig: Take 1 tablet (1,000 mg total) by mouth 2 (two) times daily.    Dispense:  180 tablet    Refill:  0  . glipiZIDE (GLUCOTROL XL) 2.5 MG 24 hr tablet    Sig: Take 1 tablet (2.5 mg total) by mouth daily  with breakfast.    Dispense:  90 tablet    Refill:  1    Pt waiting on mail order  . etodolac (LODINE) 500 MG tablet    Sig: Take 1 tablet (500 mg total) by mouth 2 (two) times daily.    Dispense:  180 tablet    Refill:  1      Dr. [name] [facility]  05/06/17

## 2017-05-07 LAB — RENAL FUNCTION PANEL
Albumin: 4.7 g/dL (ref 3.6–4.8)
BUN / CREAT RATIO: 22 (ref 10–24)
BUN: 23 mg/dL (ref 8–27)
CALCIUM: 9.6 mg/dL (ref 8.6–10.2)
CO2: 27 mmol/L (ref 20–29)
CREATININE: 1.05 mg/dL (ref 0.76–1.27)
Chloride: 104 mmol/L (ref 96–106)
GFR calc Af Amer: 85 mL/min/{1.73_m2} (ref >59)
GFR, EST NON AFRICAN AMERICAN: 74 mL/min/{1.73_m2} (ref >59)
Glucose: 107 mg/dL — ABNORMAL HIGH (ref 65–99)
Phosphorus: 3.3 mg/dL (ref 2.5–4.5)
Potassium: 4.8 mmol/L (ref 3.5–5.2)
Sodium: 146 mmol/L — ABNORMAL HIGH (ref 134–144)

## 2017-05-07 LAB — LIPID PANEL
CHOL/HDL RATIO: 3.8 ratio (ref 0.0–5.0)
Cholesterol, Total: 182 mg/dL (ref 100–199)
HDL: 48 mg/dL (ref >39)
LDL CALC: 120 mg/dL — AB (ref 0–99)
Triglycerides: 72 mg/dL (ref 0–149)
VLDL Cholesterol Cal: 14 mg/dL (ref 5–40)

## 2017-05-07 LAB — HEMOGLOBIN A1C
ESTIMATED AVERAGE GLUCOSE: 123 mg/dL
HEMOGLOBIN A1C: 5.9 % — AB (ref 4.8–5.6)

## 2017-05-18 ENCOUNTER — Other Ambulatory Visit: Payer: Self-pay

## 2017-05-27 ENCOUNTER — Other Ambulatory Visit: Payer: Self-pay

## 2017-05-27 DIAGNOSIS — M1990 Unspecified osteoarthritis, unspecified site: Secondary | ICD-10-CM

## 2017-05-27 MED ORDER — ETODOLAC 500 MG PO TABS: 500.0000 mg | ORAL_TABLET | Freq: Two times a day (BID) | ORAL | 1 refills | Status: DC

## 2017-07-10 ENCOUNTER — Encounter: Payer: Self-pay | Admitting: Emergency Medicine

## 2017-07-10 ENCOUNTER — Ambulatory Visit
Admission: EM | Admit: 2017-07-10 | Discharge: 2017-07-10 | Disposition: A | Discharge status: Home / Self Care | Payer: PPO | Attending: Family Medicine | Admitting: Family Medicine

## 2017-07-10 ENCOUNTER — Other Ambulatory Visit: Payer: Self-pay

## 2017-07-10 DIAGNOSIS — S81811A Laceration without foreign body, right lower leg, initial encounter: Secondary | ICD-10-CM

## 2017-07-10 DIAGNOSIS — W269XXA Contact with unspecified sharp object(s), initial encounter: Secondary | ICD-10-CM | POA: Diagnosis not present

## 2017-07-10 NOTE — ED Provider Notes (Signed)
[facility]    CSN: [id number] Arrival date & time: 07/10/17  1145     History   Chief Complaint Chief Complaint  Patient presents with  . Extremity Laceration    right leg    HPI Andrew [name] is a 67 y.o. male.   The history is provided by the patient.  Laceration  Location:  Leg Leg laceration location:  R leg Length:  2.8 cm Depth:  Cutaneous Quality: straight   Bleeding: controlled with pressure   Time since incident:  1 hour Injury mechanism: unsure. [place] got cut by "something" Pain details:    Quality:  Dull Foreign body present:  No foreign bodies Ineffective treatments:  None tried Tetanus status:  Up to date Associated symptoms: no numbness, no redness, no swelling and no streaking     Past Medical History:  Diagnosis Date  . Diabetes mellitus without complication ([place])   . Hyperlipidemia   . Hypertension     Patient Active Problem List   Diagnosis Date Noted  . Mixed hyperlipidemia 10/05/2016  . Familial multiple lipoprotein-type hyperlipidemia 08/02/2014  . Routine general medical examination at a health care facility 08/02/2014  . Recurrent major depressive episodes ([place]) 08/02/2014  . Essential (primary) hypertension 08/02/2014  . Overweight 08/02/2014  . Diabetes mellitus, type 2 ([place]) 08/02/2014    Past Surgical History:  Procedure Laterality Date  . NASAL SINUS SURGERY         Home Medications    Prior to Admission medications   Medication Sig Start Date End Date Taking? Authorizing Provider  amLODipine (NORVASC) 10 MG tablet Take 1 tablet (10 mg total) by mouth daily. 05/06/17  Yes Juline Patch, MD  glipiZIDE (GLUCOTROL XL) 2.5 MG 24 hr tablet Take 1 tablet (2.5 mg total) by mouth daily with breakfast. 05/06/17  Yes Juline Patch, MD  lisinopril-hydrochlorothiazide (PRINZIDE,ZESTORETIC) 20-12.5 MG tablet Take 1 tablet by mouth daily. 05/06/17  Yes Juline Patch, MD  metFORMIN  (GLUCOPHAGE) 1000 MG tablet Take 1 tablet (1,000 mg total) by mouth 2 (two) times daily. 05/06/17  Yes Juline Patch, MD  venlafaxine XR (EFFEXOR-XR) 75 MG 24 hr capsule Take 1 capsule (75 mg total) by mouth daily. 05/06/17  Yes Juline Patch, MD  etodolac (LODINE) 500 MG tablet Take 1 tablet (500 mg total) by mouth 2 (two) times daily. 05/27/17   Juline Patch, MD    Family History Family History  Problem Relation Age of Onset  . Heart disease Mother   . Healthy Father     Social History Social History   Tobacco Use  . Smoking status: Never Smoker  . Smokeless tobacco: Never Used  . Tobacco comment: smoking cessation materials not required  Substance Use Topics  . Alcohol use: No    Alcohol/week: 0.0 oz    Frequency: Never  . Drug use: No     Allergies   Patient has no known allergies.   Review of Systems Review of Systems  Constitutional:       See HPI     Physical Exam Triage Vital Signs ED Triage Vitals  Enc Vitals Group     BP 07/10/17 1155 (!) 130/92     Pulse Rate 07/10/17 1155 79     Resp 07/10/17 1155 16     Temp 07/10/17 1155 98 F (36.7 C)     Temp Source 07/10/17 1155 Oral     SpO2  07/10/17 1155 96 %     Weight 07/10/17 1157 190 lb (86.2 kg)     Height 07/10/17 1157 6\' 1"  (1.854 m)     Head Circumference --      Peak Flow --      Pain Score 07/10/17 1157 8     Pain Loc --      Pain Edu? --      Excl. in [place]? --    No data found.  Updated Vital Signs BP (!) 130/92 (BP Location: Left Arm)   Pulse 79   Temp 98 F (36.7 C) (Oral)   Resp 16   Ht 6\' 1"  (1.854 m)   Wt 190 lb (86.2 kg)   SpO2 96%   BMI 25.07 kg/m   Visual Acuity Right Eye Distance:   Left Eye Distance:   Bilateral Distance:    Right Eye Near:   Left Eye Near:    Bilateral Near:     Physical Exam  Constitutional: He is oriented to person, place, and time. He appears well-developed and well-nourished.  Cardiovascular: Normal rate.  Pulmonary/Chest: Effort normal.    Neurological: He is alert and oriented to person, place, and time.  Skin:  2.8 cm linear laceration noted on right lower medial leg.   Nursing note and vitals reviewed.    UC Treatments / Results  Labs (all labs ordered are listed, but only abnormal results are displayed) Labs Reviewed - No data to display  EKG None Radiology No results found.  Procedures Laceration Repair Date/Time: 07/10/2017 12:43 PM Performed by: Barry Dienes, NP Authorized by: Norval Gable, MD   Consent:    Consent obtained:  Verbal   Consent given by:  Patient   Risks discussed:  Infection, need for additional repair and nerve damage   Alternatives discussed:  No treatment Anesthesia (see MAR for exact dosages):    Anesthesia method:  Local infiltration   Local anesthetic:  Lidocaine 1% WITH epi Laceration details:    Location:  Leg   Length (cm):  2.8   Depth (mm):  2 Repair type:    Repair type:  Simple Pre-procedure details:    Preparation:  Patient was prepped and draped in usual sterile fashion Exploration:    Wound exploration: wound explored through full range of motion     Wound extent: no foreign bodies/material noted, no nerve damage noted, no tendon damage noted, no underlying fracture noted and no vascular damage noted     Contaminated: no   Treatment:    Area cleansed with:  Betadine Skin repair:    Repair method:  Sutures   Suture size:  4-0   Suture material:  Nylon   Suture technique:  Simple interrupted   Number of sutures:  7 Approximation:    Approximation:  Close Post-procedure details:    Dressing:  Sterile dressing   Patient tolerance of procedure:  Tolerated well, no immediate complications   (including critical care time)  Medications Ordered in UC Medications - No data to display   Initial Impression / Assessment and Plan / UC Course  I have reviewed the triage vital signs and the nursing notes.  Pertinent labs & imaging results that were available  during my care of the patient were reviewed by me and considered in my medical decision making (see chart for details).  Final Clinical Impressions(s) / UC Diagnoses   Final diagnoses:  Laceration of right lower extremity, initial encounter   Laceration repaired today. See procedure  note above.  Return in 1 week for suture removal.  Wound care discussed.  ED Discharge Orders    None     Controlled Substance Prescriptions [place] Controlled Substance Registry consulted? Not Applicable   Barry Dienes, NP 07/10/17 1245

## 2017-07-10 NOTE — ED Triage Notes (Signed)
Patient in today after lacerating his right lower leg today. Patient states he is unsure what cut his leg. Patient states the the blood was shooting out.  He states his last tetanus shot was ~ 2 months ago.

## 2017-07-13 ENCOUNTER — Encounter: Payer: Self-pay | Admitting: *Deleted

## 2017-07-13 ENCOUNTER — Ambulatory Visit
Admission: EM | Admit: 2017-07-13 | Discharge: 2017-07-13 | Disposition: A | Discharge status: Home / Self Care | Payer: PPO | Attending: Family Medicine | Admitting: Family Medicine

## 2017-07-13 DIAGNOSIS — S81811D Laceration without foreign body, right lower leg, subsequent encounter: Secondary | ICD-10-CM

## 2017-07-13 DIAGNOSIS — L03115 Cellulitis of right lower limb: Secondary | ICD-10-CM

## 2017-07-13 MED ORDER — DOXYCYCLINE HYCLATE 100 MG PO CAPS: 100.0000 mg | ORAL_CAPSULE | Freq: Two times a day (BID) | ORAL | 0 refills | Status: DC

## 2017-07-13 NOTE — ED Triage Notes (Signed)
Pt seen here Sunday and had laceration to right ankle sutured. Wound site now red and painful.

## 2017-07-13 NOTE — ED Provider Notes (Signed)
[facility]    CSN: [id number] Arrival date & time: 07/13/17  1149  History   Chief Complaint Chief Complaint  Patient presents with  . Wound Infection    HPI  67 year old male presents with the above complaint.  Patient was seen on 4/14.  He had suffered a laceration which he states was from barbed wire.  His laceration was repaired.  He was not placed on antibiotics.  He states that he has noticed that his wound is now getting red and seems more painful.  He is concerned about infection.  No purulent drainage from the wound.  No medications or interventions tried.  No known exacerbating or relieving factors.  No other complaints or concerns.  Past Medical History:  Diagnosis Date  . Diabetes mellitus without complication ([place])   . Hyperlipidemia   . Hypertension     Patient Active Problem List   Diagnosis Date Noted  . Mixed hyperlipidemia 10/05/2016  . Familial multiple lipoprotein-type hyperlipidemia 08/02/2014  . Routine general medical examination at a health care facility 08/02/2014  . Recurrent major depressive episodes ([place]) 08/02/2014  . Essential (primary) hypertension 08/02/2014  . Overweight 08/02/2014  . Diabetes mellitus, type 2 ([place]) 08/02/2014   Past Surgical History:  Procedure Laterality Date  . NASAL SINUS SURGERY      Home Medications    Prior to Admission medications   Medication Sig Start Date End Date Taking? Authorizing Provider  amLODipine (NORVASC) 10 MG tablet Take 1 tablet (10 mg total) by mouth daily. 05/06/17  Yes Juline Patch, MD  glipiZIDE (GLUCOTROL XL) 2.5 MG 24 hr tablet Take 1 tablet (2.5 mg total) by mouth daily with breakfast. 05/06/17  Yes Juline Patch, MD  lisinopril-hydrochlorothiazide (PRINZIDE,ZESTORETIC) 20-12.5 MG tablet Take 1 tablet by mouth daily. 05/06/17  Yes Juline Patch, MD  metFORMIN (GLUCOPHAGE) 1000 MG tablet Take 1 tablet (1,000 mg total) by mouth 2 (two) times daily. 05/06/17  Yes Juline Patch,  MD  venlafaxine XR (EFFEXOR-XR) 75 MG 24 hr capsule Take 1 capsule (75 mg total) by mouth daily. 05/06/17  Yes Juline Patch, MD  doxycycline (VIBRAMYCIN) 100 MG capsule Take 1 capsule (100 mg total) by mouth 2 (two) times daily. 07/13/17   Coral Spikes, DO  etodolac (LODINE) 500 MG tablet Take 1 tablet (500 mg total) by mouth 2 (two) times daily. 05/27/17   Juline Patch, MD   Family History Family History  Problem Relation Age of Onset  . Heart disease Mother   . Healthy Father    Social History Social History   Tobacco Use  . Smoking status: Never Smoker  . Smokeless tobacco: Never Used  . Tobacco comment: smoking cessation materials not required  Substance Use Topics  . Alcohol use: No    Alcohol/week: 0.0 oz    Frequency: Never  . Drug use: No   Allergies   Patient has no known allergies.  Review of Systems Review of Systems  Constitutional: Negative for chills and fever.  Skin: Positive for wound.   Physical Exam Triage Vital Signs ED Triage Vitals  Enc Vitals Group     BP 07/13/17 1211 (!) 152/101     Pulse Rate 07/13/17 1211 (!) 55     Resp 07/13/17 1211 16     Temp 07/13/17 1211 98.4 F (36.9 C)     Temp Source 07/13/17 1211 Oral     SpO2 07/13/17 1211 98 %     Weight  07/13/17 1214 190 lb (86.2 kg)     Height 07/13/17 1214 6\' 1"  (1.854 m)     Head Circumference --      Peak Flow --      Pain Score 07/13/17 1214 8     Pain Loc --      Pain Edu? --      Excl. in [place]? --    Updated Vital Signs BP (!) 152/101 (BP Location: Left Arm)   Pulse (!) 55   Temp 98.4 F (36.9 C) (Oral)   Resp 16   Ht 6\' 1"  (1.854 m)   Wt 190 lb (86.2 kg)   SpO2 98%   BMI 25.07 kg/m     Physical Exam  Constitutional: He is oriented to person, place, and time. He appears well-developed. No distress.  HENT:  Head: Normocephalic and atraumatic.  Pulmonary/Chest: Effort normal. No respiratory distress.  Musculoskeletal: Normal range of motion.  Neurological: He is alert  and oriented to person, place, and time.  Skin:  Right lower leg -laceration appears to be healing appropriately.  However, he has significant erythema and warmth surrounding his wound.  Psychiatric: He has a normal mood and affect. His behavior is normal.  Nursing note and vitals reviewed.  UC Treatments / Results  Labs (all labs ordered are listed, but only abnormal results are displayed) Labs Reviewed - No data to display  EKG None Radiology No results found.  Procedures Procedures (including critical care time)  Medications Ordered in UC Medications - No data to display   Initial Impression / Assessment and Plan / UC Course  I have reviewed the triage vital signs and the nursing notes.  Pertinent labs & imaging results that were available during my care of the patient were reviewed by me and considered in my medical decision making (see chart for details).    67 year old male presents with cellulitis.  Treating with doxycycline.  Final Clinical Impressions(s) / UC Diagnoses   Final diagnoses:  Cellulitis of right lower extremity    ED Discharge Orders        Ordered    doxycycline (VIBRAMYCIN) 100 MG capsule  2 times daily     07/13/17 1225     Controlled Substance Prescriptions [place] Controlled Substance Registry consulted? Not Applicable   Coral Spikes, DO 07/13/17 1331

## 2017-07-13 NOTE — Discharge Instructions (Signed)
Return as direct

## 2017-07-14 ENCOUNTER — Encounter: Payer: Self-pay | Admitting: Family Medicine

## 2017-07-14 ENCOUNTER — Ambulatory Visit (INDEPENDENT_AMBULATORY_CARE_PROVIDER_SITE_OTHER): Payer: PPO | Admitting: Family Medicine

## 2017-07-14 VITALS — BP 130/70 | HR 80 | Ht 73.0 in | Wt 195.0 lb

## 2017-07-14 DIAGNOSIS — Z5189 Encounter for other specified aftercare: Secondary | ICD-10-CM

## 2017-07-14 NOTE — Progress Notes (Signed)
Name: Andrew [name]   MRN: [id number]    DOB: 1950-09-18   Date:07/14/2017       Progress Note  Subjective  Chief Complaint  Chief Complaint  Patient presents with  . Laceration    barbwire cut R) leg- had stitches and was put on Doxy- looks worse    Wound Check  He was originally treated 5 to 10 days ago. Previous treatment included laceration repair. There has been no drainage from the wound. The redness has not changed (started doxycycline ). The swelling has improved. The pain has improved.    No problem-specific Assessment & Plan notes found for this encounter.   Past Medical History:  Diagnosis Date  . Diabetes mellitus without complication ([place])   . Hyperlipidemia   . Hypertension     Past Surgical History:  Procedure Laterality Date  . NASAL SINUS SURGERY      Family History  Problem Relation Age of Onset  . Heart disease Mother   . Healthy Father     Social History   Socioeconomic History  . Marital status: Married    Spouse name: Not on file  . Number of children: 1  . Years of education: Not on file  . Highest education level: Associate degree: academic program  Occupational History  . Occupation: Retired  Scientific laboratory technician  . Financial resource strain: Not hard at all  . Food insecurity:    Worry: Never true    Inability: Never true  . Transportation needs:    Medical: No    Non-medical: No  Tobacco Use  . Smoking status: Never Smoker  . Smokeless tobacco: Never Used  . Tobacco comment: smoking cessation materials not required  Substance and Sexual Activity  . Alcohol use: No    Alcohol/week: 0.0 oz    Frequency: Never  . Drug use: No  . Sexual activity: Not Currently  Lifestyle  . Physical activity:    Days per week: 0 days    Minutes per session: 0 min  . Stress: Not at all  Relationships  . Social connections:    Talks on phone: Patient refused    Gets together: Patient refused    Attends religious service: Patient refused   Active member of club or organization: Patient refused    Attends meetings of clubs or organizations: Patient refused    Relationship status: Married  . Intimate partner violence:    Fear of current or ex partner: No    Emotionally abused: No    Physically abused: No    Forced sexual activity: No  Other Topics Concern  . Not on file  Social History Narrative  . Not on file    No Known Allergies  Outpatient Medications Prior to Visit  Medication Sig Dispense Refill  . amLODipine (NORVASC) 10 MG tablet Take 1 tablet (10 mg total) by mouth daily. 90 tablet 1  . doxycycline (VIBRAMYCIN) 100 MG capsule Take 1 capsule (100 mg total) by mouth 2 (two) times daily. 14 capsule 0  . etodolac (LODINE) 500 MG tablet Take 1 tablet (500 mg total) by mouth 2 (two) times daily. 180 tablet 1  . glipiZIDE (GLUCOTROL XL) 2.5 MG 24 hr tablet Take 1 tablet (2.5 mg total) by mouth daily with breakfast. 90 tablet 1  . lisinopril-hydrochlorothiazide (PRINZIDE,ZESTORETIC) 20-12.5 MG tablet Take 1 tablet by mouth daily. 90 tablet 1  . metFORMIN (GLUCOPHAGE) 1000 MG tablet Take 1 tablet (1,000 mg total) by mouth 2 (two) times daily.  180 tablet 0  . venlafaxine XR (EFFEXOR-XR) 75 MG 24 hr capsule Take 1 capsule (75 mg total) by mouth daily. 90 capsule 1   No facility-administered medications prior to visit.     Review of Systems  Constitutional: Negative for chills, fever, malaise/fatigue and weight loss.  HENT: Negative for ear discharge, ear pain and sore throat.   Eyes: Negative for blurred vision.  Respiratory: Negative for cough, sputum production, shortness of breath and wheezing.   Cardiovascular: Negative for chest pain, palpitations and leg swelling.  Gastrointestinal: Negative for abdominal pain, blood in stool, constipation, diarrhea, heartburn, melena and nausea.  Genitourinary: Negative for dysuria, frequency, hematuria and urgency.  Musculoskeletal: Negative for back pain, joint pain, myalgias  and neck pain.  Skin: Negative for rash.  Neurological: Negative for dizziness, tingling, sensory change, focal weakness and headaches.  Endo/Heme/Allergies: Negative for environmental allergies and polydipsia. Does not bruise/bleed easily.  Psychiatric/Behavioral: Negative for depression and suicidal ideas. The patient is not nervous/anxious and does not have insomnia.      Objective  Vitals:   07/14/17 1602  BP: 130/70  Pulse: 80  Weight: 195 lb (88.5 kg)  Height: 6\' 1"  (1.854 m)    Physical Exam  Skin: Skin is warm. Laceration noted. There is erythema.  Sutures intact /surrounding erythema/ tender/without purulent drainage/redresses with netting/ allergic adhesive/ triple antibiotic      Assessment & Plan  Problem List Items Addressed This Visit    None    Visit Diagnoses    Visit for wound check    -  Primary   redressed/cont doxycycline      No orders of the defined types were placed in this encounter.     Dr. Mac[facility]  07/14/17

## 2017-07-20 ENCOUNTER — Ambulatory Visit
Admission: EM | Admit: 2017-07-20 | Discharge: 2017-07-20 | Disposition: A | Discharge status: Home / Self Care | Payer: PPO | Attending: Family Medicine | Admitting: Family Medicine

## 2017-07-20 ENCOUNTER — Encounter: Payer: Self-pay | Admitting: Emergency Medicine

## 2017-07-20 ENCOUNTER — Other Ambulatory Visit: Payer: Self-pay

## 2017-07-20 DIAGNOSIS — Z5189 Encounter for other specified aftercare: Secondary | ICD-10-CM | POA: Diagnosis not present

## 2017-07-20 DIAGNOSIS — Z4802 Encounter for removal of sutures: Secondary | ICD-10-CM

## 2017-07-20 MED ORDER — DOXYCYCLINE HYCLATE 100 MG PO TABS: 100.0000 mg | ORAL_TABLET | Freq: Two times a day (BID) | ORAL | 0 refills | Status: DC

## 2017-07-20 NOTE — Discharge Instructions (Signed)
Continue regular wound care

## 2017-07-20 NOTE — ED Provider Notes (Signed)
[facility]    CSN: [id number] Arrival date & time: 07/20/17  1158     History   Chief Complaint Chief Complaint  Patient presents with  . Suture / Staple Removal    APPT    HPI Andrew [name] is a 67 y.o. male.   67 yo male s/p laceration repair to right lower leg and infection (seen last week), here for suture removal. States completed the 1 week course of oral antibiotic and doing better but area still a little bit red and tender. Denies any fevers, chills or drainage.   The history is provided by the patient.  Suture / Staple Removal     Past Medical History:  Diagnosis Date  . Diabetes mellitus without complication ([place])   . Hyperlipidemia   . Hypertension     Patient Active Problem List   Diagnosis Date Noted  . Mixed hyperlipidemia 10/05/2016  . Familial multiple lipoprotein-type hyperlipidemia 08/02/2014  . Routine general medical examination at a health care facility 08/02/2014  . Recurrent major depressive episodes ([place]) 08/02/2014  . Essential (primary) hypertension 08/02/2014  . Overweight 08/02/2014  . Diabetes mellitus, type 2 ([place]) 08/02/2014    Past Surgical History:  Procedure Laterality Date  . NASAL SINUS SURGERY         Home Medications    Prior to Admission medications   Medication Sig Start Date End Date Taking? Authorizing Provider  amLODipine (NORVASC) 10 MG tablet Take 1 tablet (10 mg total) by mouth daily. 05/06/17  Yes Juline Patch, MD  glipiZIDE (GLUCOTROL XL) 2.5 MG 24 hr tablet Take 1 tablet (2.5 mg total) by mouth daily with breakfast. 05/06/17  Yes Juline Patch, MD  lisinopril-hydrochlorothiazide (PRINZIDE,ZESTORETIC) 20-12.5 MG tablet Take 1 tablet by mouth daily. 05/06/17  Yes Juline Patch, MD  metFORMIN (GLUCOPHAGE) 1000 MG tablet Take 1 tablet (1,000 mg total) by mouth 2 (two) times daily. 05/06/17  Yes Juline Patch, MD  venlafaxine XR (EFFEXOR-XR) 75 MG 24 hr capsule Take 1 capsule (75 mg total) by  mouth daily. 05/06/17  Yes Juline Patch, MD  doxycycline (VIBRA-TABS) 100 MG tablet Take 1 tablet (100 mg total) by mouth 2 (two) times daily. 07/20/17   Norval Gable, MD  etodolac (LODINE) 500 MG tablet Take 1 tablet (500 mg total) by mouth 2 (two) times daily. 05/27/17   Juline Patch, MD    Family History Family History  Problem Relation Age of Onset  . Heart disease Mother   . Healthy Father     Social History Social History   Tobacco Use  . Smoking status: Never Smoker  . Smokeless tobacco: Never Used  . Tobacco comment: smoking cessation materials not required  Substance Use Topics  . Alcohol use: No    Alcohol/week: 0.0 oz    Frequency: Never  . Drug use: No     Allergies   Patient has no known allergies.   Review of Systems Review of Systems   Physical Exam Triage Vital Signs ED Triage Vitals  Enc Vitals Group     BP 07/20/17 1208 (!) 163/80     Pulse Rate 07/20/17 1208 61     Resp 07/20/17 1208 16     Temp 07/20/17 1208 98 F (36.7 C)     Temp Source 07/20/17 1208 Oral     SpO2 07/20/17 1208 98 %     Weight 07/20/17 1209 195 lb (88.5 kg)     Height 07/20/17  1209 6\' 1"  (1.854 m)     Head Circumference --      Peak Flow --      Pain Score 07/20/17 1209 0     Pain Loc --      Pain Edu? --      Excl. in [place]? --    No data found.  Updated Vital Signs BP (!) 163/80 (BP Location: Left Arm)   Pulse 61   Temp 98 F (36.7 C) (Oral)   Resp 16   Ht 6\' 1"  (1.854 m)   Wt 195 lb (88.5 kg)   SpO2 98%   BMI 25.73 kg/m   Visual Acuity Right Eye Distance:   Left Eye Distance:   Bilateral Distance:    Right Eye Near:   Left Eye Near:    Bilateral Near:     Physical Exam  Constitutional: He appears well-developed and well-nourished. No distress.  Skin: He is not diaphoretic.  Right lower leg laceration with sutures in place; healed with scab; no drainage; mild surrounding erythema and minimal tenderness  Nursing note and vitals  reviewed.    UC Treatments / Results  Labs (all labs ordered are listed, but only abnormal results are displayed) Labs Reviewed - No data to display  EKG None Radiology No results found.  Procedures Procedures (including critical care time)  Medications Ordered in UC Medications - No data to display   Initial Impression / Assessment and Plan / UC Course  I have reviewed the triage vital signs and the nursing notes.  Pertinent labs & imaging results that were available during my care of the patient were reviewed by me and considered in my medical decision making (see chart for details).       Final Clinical Impressions(s) / UC Diagnoses   Final diagnoses:  Visit for wound check  Encounter for removal of sutures    ED Discharge Orders        Ordered    doxycycline (VIBRA-TABS) 100 MG tablet  2 times daily     07/20/17 1256     1. diagnosis reviewed with patient 2. rx as per orders above; reviewed possible side effects, interactions, risks and benefits; recommend another week of oral antibiotic 3. Recommend supportive treatment with continued wound care 4. Sutures removed 5. Follow-up prn if symptoms worsen or don't improve  Controlled Substance Prescriptions [place] Controlled Substance Registry consulted? Not Applicable   Norval Gable, MD 07/20/17 1318

## 2017-07-20 NOTE — ED Triage Notes (Signed)
Patient in today for suture removal. Patient has finished antibiotics, but wound is red.

## 2017-08-04 ENCOUNTER — Other Ambulatory Visit: Payer: Self-pay

## 2017-08-04 DIAGNOSIS — I10 Essential (primary) hypertension: Secondary | ICD-10-CM

## 2017-08-04 DIAGNOSIS — F339 Major depressive disorder, recurrent, unspecified: Secondary | ICD-10-CM

## 2017-08-04 DIAGNOSIS — E119 Type 2 diabetes mellitus without complications: Secondary | ICD-10-CM

## 2017-08-04 MED ORDER — LISINOPRIL-HYDROCHLOROTHIAZIDE 20-12.5 MG PO TABS: 1.0000 | ORAL_TABLET | Freq: Every day | ORAL | 0 refills | Status: DC

## 2017-08-04 MED ORDER — VENLAFAXINE HCL ER 75 MG PO CP24
75.0000 mg | ORAL_CAPSULE | Freq: Every day | ORAL | 0 refills | Status: DC
Start: 2017-08-04 — End: 2017-10-26

## 2017-08-04 MED ORDER — GLIPIZIDE ER 2.5 MG PO TB24
2.5000 mg | ORAL_TABLET | Freq: Every day | ORAL | 0 refills | Status: DC
Start: 2017-08-04 — End: 2017-10-26

## 2017-08-04 MED ORDER — AMLODIPINE BESYLATE 10 MG PO TABS: 10.0000 mg | ORAL_TABLET | Freq: Every day | ORAL | 0 refills | Status: DC

## 2017-08-04 MED ORDER — METFORMIN HCL 1000 MG PO TABS: 1000.0000 mg | ORAL_TABLET | Freq: Two times a day (BID) | ORAL | 0 refills | Status: DC

## 2017-09-15 ENCOUNTER — Ambulatory Visit (INDEPENDENT_AMBULATORY_CARE_PROVIDER_SITE_OTHER): Payer: PPO | Admitting: Family Medicine

## 2017-09-15 ENCOUNTER — Ambulatory Visit
Admission: RE | Admit: 2017-09-15 | Discharge: 2017-09-15 | Disposition: A | Discharge status: Home / Self Care | Payer: PPO | Source: Ambulatory Visit | Attending: Family Medicine | Admitting: Family Medicine

## 2017-09-15 ENCOUNTER — Encounter: Payer: Self-pay | Admitting: Family Medicine

## 2017-09-15 ENCOUNTER — Other Ambulatory Visit: Payer: Self-pay | Admitting: Family Medicine

## 2017-09-15 VITALS — BP 130/80 | HR 60 | Ht 73.0 in | Wt 199.0 lb

## 2017-09-15 DIAGNOSIS — W19XXXA Unspecified fall, initial encounter: Secondary | ICD-10-CM | POA: Diagnosis not present

## 2017-09-15 DIAGNOSIS — M25512 Pain in left shoulder: Secondary | ICD-10-CM | POA: Insufficient documentation

## 2017-09-15 DIAGNOSIS — G8911 Acute pain due to trauma: Secondary | ICD-10-CM

## 2017-09-15 DIAGNOSIS — S4992XA Unspecified injury of left shoulder and upper arm, initial encounter: Secondary | ICD-10-CM | POA: Diagnosis not present

## 2017-09-15 MED ORDER — TRAMADOL HCL 50 MG PO TABS: 50.0000 mg | ORAL_TABLET | Freq: Four times a day (QID) | ORAL | 0 refills | Status: AC | PRN

## 2017-09-15 NOTE — Progress Notes (Signed)
Name: Andrew [name]   MRN: [id number]    DOB: [birth year]   Date:09/15/2017       Progress Note  Subjective  Chief Complaint  Chief Complaint  Patient presents with  . Shoulder Pain    L) shoulder pain- limited ROM, can raise arm but can't reach back behind him. Fell on wooden platform    Shoulder Pain   The pain is present in the left shoulder. This is a new problem. The current episode started yesterday. The problem occurs constantly. The problem has been gradually worsening. The quality of the pain is described as aching. The pain is at a severity of 6/10. The pain is moderate. Associated symptoms include an inability to bear weight and a limited range of motion. Pertinent negatives include no fever, itching, joint locking, joint swelling, numbness, stiffness or tingling. The symptoms are aggravated by activity. He has tried acetaminophen and NSAIDS for the symptoms. The treatment provided no relief.    No problem-specific Assessment & Plan notes found for this encounter.   Past Medical History:  Diagnosis Date  . Diabetes mellitus without complication ([place])   . Hyperlipidemia   . Hypertension     Past Surgical History:  Procedure Laterality Date  . NASAL SINUS SURGERY      Family History  Problem Relation Age of Onset  . Heart disease Mother   . Healthy Father     Social History   Socioeconomic History  . Marital status: Married    Spouse name: Not on file  . Number of children: 1  . Years of education: Not on file  . Highest education level: Associate degree: academic program  Occupational History  . Occupation: Retired  Scientific laboratory technician  . Financial resource strain: Not hard at all  . Food insecurity:    Worry: Never true    Inability: Never true  . Transportation needs:    Medical: No    Non-medical: No  Tobacco Use  . Smoking status: Never Smoker  . Smokeless tobacco: Never Used  . Tobacco comment: smoking cessation materials not required  Substance and  Sexual Activity  . Alcohol use: No    Alcohol/week: 0.0 oz    Frequency: Never  . Drug use: No  . Sexual activity: Not Currently  Lifestyle  . Physical activity:    Days per week: 0 days    Minutes per session: 0 min  . Stress: Not at all  Relationships  . Social connections:    Talks on phone: Patient refused    Gets together: Patient refused    Attends religious service: Patient refused    Active member of club or organization: Patient refused    Attends meetings of clubs or organizations: Patient refused    Relationship status: Married  . Intimate partner violence:    Fear of current or ex partner: No    Emotionally abused: No    Physically abused: No    Forced sexual activity: No  Other Topics Concern  . Not on file  Social History Narrative  . Not on file    No Known Allergies  Outpatient Medications Prior to Visit  Medication Sig Dispense Refill  . amLODipine (NORVASC) 10 MG tablet Take 1 tablet (10 mg total) by mouth daily. 90 tablet 0  . glipiZIDE (GLUCOTROL XL) 2.5 MG 24 hr tablet Take 1 tablet (2.5 mg total) by mouth daily with breakfast. 90 tablet 0  . lisinopril-hydrochlorothiazide (PRINZIDE,ZESTORETIC) 20-12.5 MG tablet Take 1 tablet by  mouth daily. 90 tablet 0  . metFORMIN (GLUCOPHAGE) 1000 MG tablet Take 1 tablet (1,000 mg total) by mouth 2 (two) times daily. 180 tablet 0  . venlafaxine XR (EFFEXOR-XR) 75 MG 24 hr capsule Take 1 capsule (75 mg total) by mouth daily. 90 capsule 0  . etodolac (LODINE) 500 MG tablet Take 1 tablet (500 mg total) by mouth 2 (two) times daily. (Patient not taking: Reported on 09/15/2017) 180 tablet 1  . doxycycline (VIBRA-TABS) 100 MG tablet Take 1 tablet (100 mg total) by mouth 2 (two) times daily. 14 tablet 0   No facility-administered medications prior to visit.     Review of Systems  Constitutional: Negative for chills, fever, malaise/fatigue and weight loss.  HENT: Negative for ear discharge, ear pain and sore throat.    Eyes: Negative for blurred vision.  Respiratory: Negative for cough, sputum production, shortness of breath and wheezing.   Cardiovascular: Negative for chest pain, palpitations and leg swelling.  Gastrointestinal: Negative for abdominal pain, blood in stool, constipation, diarrhea, heartburn, melena and nausea.  Genitourinary: Negative for dysuria, frequency, hematuria and urgency.  Musculoskeletal: Positive for joint pain. Negative for back pain, myalgias, neck pain and stiffness.  Skin: Negative for itching and rash.  Neurological: Negative for dizziness, tingling, sensory change, focal weakness, numbness and headaches.  Endo/Heme/Allergies: Negative for environmental allergies and polydipsia. Does not bruise/bleed easily.  Psychiatric/Behavioral: Negative for depression and suicidal ideas. The patient is not nervous/anxious and does not have insomnia.      Objective  Vitals:   09/15/17 1519  BP: 130/80  Pulse: 60  Weight: 199 lb (90.3 kg)  Height: 6\' 1"  (1.854 m)    Physical Exam  Constitutional: He is oriented to person, place, and time.  HENT:  Head: Normocephalic.  Right Ear: External ear normal.  Left Ear: External ear normal.  Nose: Nose normal.  Mouth/Throat: Oropharynx is clear and moist.  Eyes: Pupils are equal, round, and reactive to light. Conjunctivae and EOM are normal. Right eye exhibits no discharge. Left eye exhibits no discharge. No scleral icterus.  Neck: Normal range of motion. Neck supple. No JVD present. No tracheal deviation present. No thyromegaly present.  Cardiovascular: Normal rate, regular rhythm, normal heart sounds and intact distal pulses. Exam reveals no gallop and no friction rub.  No murmur heard. Pulmonary/Chest: Breath sounds normal. No respiratory distress. He has no wheezes. He has no rales.  Abdominal: Soft. Bowel sounds are normal. He exhibits no mass. There is no hepatosplenomegaly. There is no tenderness. There is no rebound, no  guarding and no CVA tenderness.  Musculoskeletal: He exhibits tenderness. He exhibits no edema.       Left shoulder: He exhibits decreased range of motion, tenderness and bony tenderness.  Tender ac ligament  Lymphadenopathy:    He has no cervical adenopathy.  Neurological: He is alert and oriented to person, place, and time. He has normal strength and normal reflexes. No cranial nerve deficit.  Skin: Skin is warm. No rash noted.  Nursing note and vitals reviewed.     Assessment & Plan  Problem List Items Addressed This Visit    None    Visit Diagnoses    Fall, initial encounter    -  Primary   xray shoulder s/p fall   Relevant Orders   DG Shoulder Left   Acute pain of left shoulder       xray shoulder s/p fall on wooden platform   Relevant Orders   DG Shoulder  Left      Meds ordered this encounter  Medications  . traMADol (ULTRAM) 50 MG tablet    Sig: Take 1 tablet (50 mg total) by mouth every 6 (six) hours as needed for up to 5 days.    Dispense:  20 tablet    Refill:  0      Dr. [name]  09/15/17

## 2017-09-15 NOTE — Progress Notes (Unsigned)
Send to ortho

## 2017-09-21 ENCOUNTER — Other Ambulatory Visit: Payer: Self-pay | Admitting: Orthopedic Surgery

## 2017-09-21 DIAGNOSIS — E118 Type 2 diabetes mellitus with unspecified complications: Secondary | ICD-10-CM | POA: Diagnosis not present

## 2017-09-21 DIAGNOSIS — W010XXA Fall on same level from slipping, tripping and stumbling without subsequent striking against object, initial encounter: Secondary | ICD-10-CM | POA: Diagnosis not present

## 2017-09-21 DIAGNOSIS — S46002A Unspecified injury of muscle(s) and tendon(s) of the rotator cuff of left shoulder, initial encounter: Secondary | ICD-10-CM | POA: Diagnosis not present

## 2017-09-21 DIAGNOSIS — M25312 Other instability, left shoulder: Secondary | ICD-10-CM

## 2017-10-12 ENCOUNTER — Ambulatory Visit
Admission: RE | Admit: 2017-10-12 | Discharge: 2017-10-12 | Disposition: A | Discharge status: Home / Self Care | Payer: PPO | Source: Ambulatory Visit | Attending: Orthopedic Surgery | Admitting: Orthopedic Surgery

## 2017-10-12 DIAGNOSIS — S46812A Strain of other muscles, fascia and tendons at shoulder and upper arm level, left arm, initial encounter: Secondary | ICD-10-CM | POA: Diagnosis not present

## 2017-10-12 DIAGNOSIS — S46002A Unspecified injury of muscle(s) and tendon(s) of the rotator cuff of left shoulder, initial encounter: Secondary | ICD-10-CM

## 2017-10-12 DIAGNOSIS — M25312 Other instability, left shoulder: Secondary | ICD-10-CM

## 2017-10-12 DIAGNOSIS — M75122 Complete rotator cuff tear or rupture of left shoulder, not specified as traumatic: Secondary | ICD-10-CM | POA: Insufficient documentation

## 2017-10-18 DIAGNOSIS — S46002A Unspecified injury of muscle(s) and tendon(s) of the rotator cuff of left shoulder, initial encounter: Secondary | ICD-10-CM | POA: Diagnosis not present

## 2017-10-26 ENCOUNTER — Ambulatory Visit (INDEPENDENT_AMBULATORY_CARE_PROVIDER_SITE_OTHER): Payer: PPO | Admitting: Family Medicine

## 2017-10-26 ENCOUNTER — Encounter: Payer: Self-pay | Admitting: Family Medicine

## 2017-10-26 VITALS — BP 130/68 | HR 62 | Ht 73.0 in | Wt 199.0 lb

## 2017-10-26 DIAGNOSIS — F339 Major depressive disorder, recurrent, unspecified: Secondary | ICD-10-CM | POA: Diagnosis not present

## 2017-10-26 DIAGNOSIS — E119 Type 2 diabetes mellitus without complications: Secondary | ICD-10-CM

## 2017-10-26 DIAGNOSIS — I10 Essential (primary) hypertension: Secondary | ICD-10-CM | POA: Diagnosis not present

## 2017-10-26 DIAGNOSIS — E7849 Other hyperlipidemia: Secondary | ICD-10-CM | POA: Diagnosis not present

## 2017-10-26 MED ORDER — METFORMIN HCL 1000 MG PO TABS: 1000.0000 mg | ORAL_TABLET | Freq: Two times a day (BID) | ORAL | 1 refills | Status: DC

## 2017-10-26 MED ORDER — VENLAFAXINE HCL ER 75 MG PO CP24: 75.0000 mg | ORAL_CAPSULE | Freq: Every day | ORAL | 1 refills | Status: DC

## 2017-10-26 MED ORDER — GLIPIZIDE ER 2.5 MG PO TB24: 2.5000 mg | ORAL_TABLET | Freq: Every day | ORAL | 1 refills | Status: DC

## 2017-10-26 MED ORDER — LISINOPRIL-HYDROCHLOROTHIAZIDE 20-12.5 MG PO TABS: 1.0000 | ORAL_TABLET | Freq: Every day | ORAL | 1 refills | Status: DC

## 2017-10-26 MED ORDER — AMLODIPINE BESYLATE 10 MG PO TABS: 10.0000 mg | ORAL_TABLET | Freq: Every day | ORAL | 1 refills | Status: DC

## 2017-10-26 NOTE — Assessment & Plan Note (Signed)
Chronic. Controlled. Continue lisinopril-hctz 20-12.5 daily. Check renal panel

## 2017-10-26 NOTE — Progress Notes (Signed)
Name: Andrew [name]   MRN: [id number]    DOB: 07/09/1950   Date:10/26/2017       Progress Note  Subjective  Chief Complaint  Chief Complaint  Patient presents with  . Hypertension  . Depression  . Diabetes    Hypertension  This is a chronic problem. The current episode started more than 1 year ago. The problem is unchanged. The problem is controlled. Pertinent negatives include no anxiety, blurred vision, chest pain, headaches, malaise/fatigue, neck pain, orthopnea, palpitations, peripheral edema, PND, shortness of breath or sweats. There are no associated agents to hypertension. There are no known risk factors for coronary artery disease. Past treatments include ACE inhibitors, diuretics and calcium channel blockers. The current treatment provides moderate improvement. There are no compliance problems.  There is no history of angina, kidney disease, CAD/MI, CVA, heart failure, left ventricular hypertrophy, PVD or retinopathy. There is no history of chronic renal disease, a hypertension causing med or renovascular disease.  Depression         This is a chronic problem.  The current episode started more than 1 year ago.   The onset quality is undetermined.   Associated symptoms include no decreased concentration, no fatigue, no helplessness, no hopelessness, does not have insomnia, not irritable, no restlessness, no decreased interest, no appetite change, no body aches, no myalgias, no headaches, no indigestion, not sad and no suicidal ideas.  Past treatments include SNRIs - Serotonin and norepinephrine reuptake inhibitors.  Compliance with treatment is good.  Previous treatment provided moderate relief.   Pertinent negatives include no anxiety. Diabetes  He presents for his follow-up diabetic visit. He has type 2 diabetes mellitus. His disease course has been stable. Pertinent negatives for hypoglycemia include no confusion, dizziness, headaches, hunger, mood changes, nervousness/anxiousness,  pallor, seizures, sleepiness, speech difficulty, sweats or tremors. Pertinent negatives for diabetes include no blurred vision, no chest pain, no fatigue, no foot paresthesias, no foot ulcerations, no polydipsia, no polyphagia, no polyuria, no visual change, no weakness and no weight loss. Symptoms are stable. Pertinent negatives for diabetic complications include no autonomic neuropathy, CVA, heart disease, impotence, nephropathy, peripheral neuropathy, PVD or retinopathy. Risk factors for coronary artery disease include diabetes mellitus, dyslipidemia, male sex and hypertension. Current diabetic treatment includes oral agent (dual therapy). His weight is stable. He is following a generally healthy diet. He participates in exercise daily. There is no change in his home blood glucose trend. An ACE inhibitor/angiotensin II receptor blocker is being taken. He does not see a podiatrist.Eye exam is current.  Hyperlipidemia  This is a chronic problem. The current episode started more than 1 year ago. The problem is controlled. Recent lipid tests were reviewed and are normal. He has no history of chronic renal disease. Pertinent negatives include no chest pain, focal weakness, myalgias or shortness of breath. Current antihyperlipidemic treatment includes diet change. The current treatment provides moderate improvement of lipids. There are no compliance problems.     Essential (primary) hypertension Chronic. Controlled. Continue lisinopril-hctz 20-12.5 daily. Check renal panel  Diabetes mellitus, type 2 Chronic Controlled Continue metformen 1 gm bid and glipizide 2.5 xl q day. Check a1c and renal panel. Discussed diet.  Recurrent major depressive episodes Chronic Stable Continue velnlafaxine xr 75 mg q 24 hr.  Familial multiple lipoprotein-type hyperlipidemia Chronic Stable Diet controlled.    Past Medical History:  Diagnosis Date  . Diabetes mellitus without complication ([place])   . Hyperlipidemia   .  Hypertension  Past Surgical History:  Procedure Laterality Date  . NASAL SINUS SURGERY      Family History  Problem Relation Age of Onset  . Heart disease Mother   . Healthy Father     Social History   Socioeconomic History  . Marital status: Married    Spouse name: Not on file  . Number of children: 1  . Years of education: Not on file  . Highest education level: Associate degree: academic program  Occupational History  . Occupation: Retired  Scientific laboratory technician  . Financial resource strain: Not hard at all  . Food insecurity:    Worry: Never true    Inability: Never true  . Transportation needs:    Medical: No    Non-medical: No  Tobacco Use  . Smoking status: Never Smoker  . Smokeless tobacco: Never Used  . Tobacco comment: smoking cessation materials not required  Substance and Sexual Activity  . Alcohol use: No    Alcohol/week: 0.0 oz    Frequency: Never  . Drug use: No  . Sexual activity: Not Currently  Lifestyle  . Physical activity:    Days per week: 0 days    Minutes per session: 0 min  . Stress: Not at all  Relationships  . Social connections:    Talks on phone: Patient refused    Gets together: Patient refused    Attends religious service: Patient refused    Active member of club or organization: Patient refused    Attends meetings of clubs or organizations: Patient refused    Relationship status: Married  . Intimate partner violence:    Fear of current or ex partner: No    Emotionally abused: No    Physically abused: No    Forced sexual activity: No  Other Topics Concern  . Not on file  Social History Narrative  . Not on file    No Known Allergies  Outpatient Medications Prior to Visit  Medication Sig Dispense Refill  . amLODipine (NORVASC) 10 MG tablet Take 1 tablet (10 mg total) by mouth daily. 90 tablet 0  . glipiZIDE (GLUCOTROL XL) 2.5 MG 24 hr tablet Take 1 tablet (2.5 mg total) by mouth daily with breakfast. 90 tablet 0  .  lisinopril-hydrochlorothiazide (PRINZIDE,ZESTORETIC) 20-12.5 MG tablet Take 1 tablet by mouth daily. 90 tablet 0  . metFORMIN (GLUCOPHAGE) 1000 MG tablet Take 1 tablet (1,000 mg total) by mouth 2 (two) times daily. 180 tablet 0  . venlafaxine XR (EFFEXOR-XR) 75 MG 24 hr capsule Take 1 capsule (75 mg total) by mouth daily. 90 capsule 0  . etodolac (LODINE) 500 MG tablet Take 1 tablet (500 mg total) by mouth 2 (two) times daily. (Patient not taking: Reported on 09/15/2017) 180 tablet 1   No facility-administered medications prior to visit.     Review of Systems  Constitutional: Negative for appetite change, chills, fatigue, fever, malaise/fatigue and weight loss.  HENT: Negative for ear discharge, ear pain and sore throat.   Eyes: Negative for blurred vision.  Respiratory: Negative for cough, sputum production, shortness of breath and wheezing.   Cardiovascular: Negative for chest pain, palpitations, orthopnea, leg swelling and PND.  Gastrointestinal: Negative for abdominal pain, blood in stool, constipation, diarrhea, heartburn, melena and nausea.  Genitourinary: Negative for dysuria, frequency, hematuria, impotence and urgency.  Musculoskeletal: Negative for back pain, joint pain, myalgias and neck pain.  Skin: Negative for pallor and rash.  Neurological: Negative for dizziness, tingling, tremors, sensory change, focal weakness, seizures, speech  difficulty, weakness and headaches.  Endo/Heme/Allergies: Negative for environmental allergies, polydipsia and polyphagia. Does not bruise/bleed easily.  Psychiatric/Behavioral: Positive for depression. Negative for confusion, decreased concentration and suicidal ideas. The patient is not nervous/anxious and does not have insomnia.      Objective  Vitals:   10/26/17 1347  BP: 130/68  Pulse: 62  Weight: 199 lb (90.3 kg)  Height: 6\' 1"  (1.854 m)    Physical Exam  Constitutional: He is oriented to person, place, and time. He is not irritable.   HENT:  Head: Normocephalic.  Right Ear: External ear normal.  Left Ear: External ear normal.  Nose: Nose normal.  Mouth/Throat: Oropharynx is clear and moist.  Eyes: Pupils are equal, round, and reactive to light. Conjunctivae and EOM are normal. Right eye exhibits no discharge. Left eye exhibits no discharge. No scleral icterus.  Neck: Normal range of motion. Neck supple. No JVD present. No tracheal deviation present. No thyromegaly present.  Cardiovascular: Normal rate, regular rhythm, S1 normal, S2 normal, normal heart sounds, intact distal pulses and normal pulses. PMI is not displaced. Exam reveals no gallop, no S3, no S4, no distant heart sounds and no friction rub.  No murmur heard.  No systolic murmur is present.  No diastolic murmur is present. Pulmonary/Chest: Breath sounds normal. No respiratory distress. He has no wheezes. He has no rales.  Abdominal: Soft. Bowel sounds are normal. He exhibits no mass. There is no hepatosplenomegaly. There is no tenderness. There is no rebound, no guarding and no CVA tenderness.  Musculoskeletal: Normal range of motion. He exhibits no edema or tenderness.  Lymphadenopathy:    He has no cervical adenopathy.  Neurological: He is alert and oriented to person, place, and time. He has normal strength and normal reflexes. No cranial nerve deficit.  Skin: Skin is warm. No rash noted.  Nursing note and vitals reviewed.     Assessment & Plan  Problem List Items Addressed This Visit      Cardiovascular and Mediastinum   Essential (primary) hypertension    Chronic. Controlled. Continue lisinopril-hctz 20-12.5 daily. Check renal panel      Relevant Medications   lisinopril-hydrochlorothiazide (PRINZIDE,ZESTORETIC) 20-12.5 MG tablet   amLODipine (NORVASC) 10 MG tablet   Other Relevant Orders   Renal Function Panel     Endocrine   Diabetes mellitus, type 2 ([facility]) - Primary    Chronic Controlled Continue metformen 1 gm bid and glipizide 2.5 xl  q day. Check a1c and renal panel. Discussed diet.      Relevant Medications   lisinopril-hydrochlorothiazide (PRINZIDE,ZESTORETIC) 20-12.5 MG tablet   metFORMIN (GLUCOPHAGE) 1000 MG tablet   glipiZIDE (GLUCOTROL XL) 2.5 MG 24 hr tablet   Other Relevant Orders   Hemoglobin A1c   Renal Function Panel     Other   Familial multiple lipoprotein-type hyperlipidemia    Chronic Stable Diet controlled.       Relevant Medications   lisinopril-hydrochlorothiazide (PRINZIDE,ZESTORETIC) 20-12.5 MG tablet   amLODipine (NORVASC) 10 MG tablet   Recurrent major depressive episodes ([facility])    Chronic Stable Continue velnlafaxine xr 75 mg q 24 hr.      Relevant Medications   venlafaxine XR (EFFEXOR-XR) 75 MG 24 hr capsule    l q day.   Meds ordered this encounter  Medications  . lisinopril-hydrochlorothiazide (PRINZIDE,ZESTORETIC) 20-12.5 MG tablet    Sig: Take 1 tablet by mouth daily.    Dispense:  90 tablet    Refill:  1  . amLODipine (NORVASC)  10 MG tablet    Sig: Take 1 tablet (10 mg total) by mouth daily.    Dispense:  90 tablet    Refill:  1  . venlafaxine XR (EFFEXOR-XR) 75 MG 24 hr capsule    Sig: Take 1 capsule (75 mg total) by mouth daily.    Dispense:  90 capsule    Refill:  1  . metFORMIN (GLUCOPHAGE) 1000 MG tablet    Sig: Take 1 tablet (1,000 mg total) by mouth 2 (two) times daily.    Dispense:  180 tablet    Refill:  1  . glipiZIDE (GLUCOTROL XL) 2.5 MG 24 hr tablet    Sig: Take 1 tablet (2.5 mg total) by mouth daily with breakfast.    Dispense:  90 tablet    Refill:  1      Dr. [facility]  10/26/17

## 2017-10-26 NOTE — Assessment & Plan Note (Signed)
Chronic Stable Continue velnlafaxine xr 75 mg q 24 hr.

## 2017-10-26 NOTE — Assessment & Plan Note (Signed)
Chronic Controlled Continue metformen 1 gm bid and glipizide 2.5 xl q day. Check a1c and renal panel. Discussed diet.

## 2017-10-26 NOTE — Assessment & Plan Note (Signed)
Chronic Stable Diet controlled.

## 2017-10-26 NOTE — Patient Instructions (Signed)
Diabetic Retinopathy Diabetic retinopathy is a disease of the light-sensitive membrane at the back of the eye (retina). It is a complication of diabetes and a common cause of blindness. Early detection of the disease is key to keeping your eyes healthy. What are the causes? Diabetic retinopathy is caused by blood sugar (glucose) levels that are too high over an extended period of time. High blood sugars cause damage to the small blood vessels of the retina, allowing blood to leak through the vessel walls. This causes visual impairment and eventually blindness. What increases the risk?  High blood pressure.  Having diabetes for a long time.  Having poorly controlled blood sugars. What are the signs or symptoms? In the early stages of diabetic retinopathy, there are often no symptoms. As the condition advances, symptoms may include:  Blurred vision. This is usually caused by a swelling due to abnormal blood glucose levels. The blurriness may go away when blood glucose levels return to normal.  Moving specks or dark spots (floaters) in your vision. These can be caused by a small retinal hemorrhage. A hemorrhage is bleeding from blood vessels.  Missing parts of your field of vision, such as things at the side. This can be caused by larger retinal hemorrhages.  Difficulty reading books or signs.  Double vision.  Pain in one or both eyes.  Feeling pressure in one or both eyes.  Trouble seeing straight lines. Straight lines do not look straight.  Redness of the eyes that does not go away.  How is this diagnosed? Your eye care specialist can detect changes in the blood vessels of your eye by putting drops in your eyes that enlarge (dilate) your pupils. This allows your eye care specialist to get a good look at your retina to see if there are any changes that have occurred as a result of your diabetes. You should have your eyes examined once a year. How is this treated? Your eye care  specialist may use a special laser beam to seal the blood vessels of the retina and stop them from leaking. Early detection and treatment are important so that further damage to your eyes can be prevented. In addition, managing your blood sugars and keeping them in the target range can slow the progress of the disease. Follow these instructions at home:  Keep your blood pressure within your target range.  Keep your blood glucose levels within your target range.  Follow your health care provider's instructions regarding diet and other means for controlling your blood glucose levels.  Check your blood levels for glucose as recommended by your health care provider.  Keep regular appointments with your eye specialist. An eye specialist can usually see diabetic retinopathy developing long before it starts causing problems. In many cases, it can be treated to prevent complications from occurring. If you have diabetes, you should have your eyes checked at least every year. Your risk of retinopathy increases the longer you have the disease.  If you smoke, quit. Ask your health care provider for help if needed. Smoking can make retinopathy worse. Contact a health care provider if:  You notice gradual blurring or other changes in your vision over time.  You notice that your glasses or contact lenses do not make things look as sharp as they once did.  You have trouble reading or seeing details at a distance with either eye.  You notice a sudden change in your vision or notice that parts of your field of vision appear   missing or hazy.  You suddenly see moving specks or dark spots in the field of vision of either eye.  You have sudden partial loss of vision in either eye. This information is not intended to replace advice given to you by your health care provider. Make sure you discuss any questions you have with your health care provider. Document Released: 03/12/2000 Document Revised: 08/21/2015  Document Reviewed: 09/04/2012 [employer] Interactive Patient Education  2017 [employer]  

## 2017-10-27 DIAGNOSIS — M25512 Pain in left shoulder: Secondary | ICD-10-CM | POA: Diagnosis not present

## 2017-10-27 DIAGNOSIS — Z8739 Personal history of other diseases of the musculoskeletal system and connective tissue: Secondary | ICD-10-CM | POA: Diagnosis not present

## 2017-10-27 DIAGNOSIS — M6281 Muscle weakness (generalized): Secondary | ICD-10-CM | POA: Diagnosis not present

## 2017-10-27 DIAGNOSIS — M25612 Stiffness of left shoulder, not elsewhere classified: Secondary | ICD-10-CM | POA: Diagnosis not present

## 2017-10-27 LAB — RENAL FUNCTION PANEL
ALBUMIN: 4.9 g/dL — AB (ref 3.6–4.8)
BUN / CREAT RATIO: 19 (ref 10–24)
BUN: 19 mg/dL (ref 8–27)
CHLORIDE: 98 mmol/L (ref 96–106)
CO2: 24 mmol/L (ref 20–29)
Calcium: 10.1 mg/dL (ref 8.6–10.2)
Creatinine, Ser: 1.01 mg/dL (ref 0.76–1.27)
GFR calc non Af Amer: 77 mL/min/{1.73_m2} (ref >59)
GFR, EST AFRICAN AMERICAN: 89 mL/min/{1.73_m2} (ref >59)
GLUCOSE: 84 mg/dL (ref 65–99)
POTASSIUM: 4.2 mmol/L (ref 3.5–5.2)
Phosphorus: 3.4 mg/dL (ref 2.5–4.5)
Sodium: 140 mmol/L (ref 134–144)

## 2017-10-27 LAB — HEMOGLOBIN A1C
Est. average glucose Bld gHb Est-mCnc: 126 mg/dL
HEMOGLOBIN A1C: 6 % — AB (ref 4.8–5.6)

## 2017-10-31 DIAGNOSIS — M25612 Stiffness of left shoulder, not elsewhere classified: Secondary | ICD-10-CM | POA: Diagnosis not present

## 2017-10-31 DIAGNOSIS — M6281 Muscle weakness (generalized): Secondary | ICD-10-CM | POA: Diagnosis not present

## 2017-10-31 DIAGNOSIS — Z8739 Personal history of other diseases of the musculoskeletal system and connective tissue: Secondary | ICD-10-CM | POA: Diagnosis not present

## 2017-10-31 DIAGNOSIS — M25512 Pain in left shoulder: Secondary | ICD-10-CM | POA: Diagnosis not present

## 2017-11-03 DIAGNOSIS — M6281 Muscle weakness (generalized): Secondary | ICD-10-CM | POA: Diagnosis not present

## 2017-11-03 DIAGNOSIS — Z8739 Personal history of other diseases of the musculoskeletal system and connective tissue: Secondary | ICD-10-CM | POA: Diagnosis not present

## 2017-11-03 DIAGNOSIS — M25512 Pain in left shoulder: Secondary | ICD-10-CM | POA: Diagnosis not present

## 2017-11-03 DIAGNOSIS — M25612 Stiffness of left shoulder, not elsewhere classified: Secondary | ICD-10-CM | POA: Diagnosis not present

## 2017-11-07 DIAGNOSIS — C44111 Basal cell carcinoma of skin of unspecified eyelid, including canthus: Secondary | ICD-10-CM | POA: Diagnosis not present

## 2017-11-07 DIAGNOSIS — D485 Neoplasm of uncertain behavior of skin: Secondary | ICD-10-CM | POA: Diagnosis not present

## 2017-11-09 DIAGNOSIS — M25512 Pain in left shoulder: Secondary | ICD-10-CM | POA: Diagnosis not present

## 2017-11-09 DIAGNOSIS — Z8739 Personal history of other diseases of the musculoskeletal system and connective tissue: Secondary | ICD-10-CM | POA: Diagnosis not present

## 2017-11-09 DIAGNOSIS — M6281 Muscle weakness (generalized): Secondary | ICD-10-CM | POA: Diagnosis not present

## 2017-11-09 DIAGNOSIS — M25612 Stiffness of left shoulder, not elsewhere classified: Secondary | ICD-10-CM | POA: Diagnosis not present

## 2017-11-10 DIAGNOSIS — M25612 Stiffness of left shoulder, not elsewhere classified: Secondary | ICD-10-CM | POA: Diagnosis not present

## 2017-11-10 DIAGNOSIS — M6281 Muscle weakness (generalized): Secondary | ICD-10-CM | POA: Diagnosis not present

## 2017-11-10 DIAGNOSIS — M25512 Pain in left shoulder: Secondary | ICD-10-CM | POA: Diagnosis not present

## 2017-11-10 DIAGNOSIS — Z8739 Personal history of other diseases of the musculoskeletal system and connective tissue: Secondary | ICD-10-CM | POA: Diagnosis not present

## 2017-12-14 DIAGNOSIS — C44111 Basal cell carcinoma of skin of unspecified eyelid, including canthus: Secondary | ICD-10-CM | POA: Diagnosis not present

## 2017-12-14 DIAGNOSIS — C4491 Basal cell carcinoma of skin, unspecified: Secondary | ICD-10-CM | POA: Diagnosis not present

## 2018-03-31 ENCOUNTER — Ambulatory Visit (INDEPENDENT_AMBULATORY_CARE_PROVIDER_SITE_OTHER): Payer: PPO | Admitting: Family Medicine

## 2018-03-31 ENCOUNTER — Encounter: Payer: Self-pay | Admitting: Family Medicine

## 2018-03-31 DIAGNOSIS — F339 Major depressive disorder, recurrent, unspecified: Secondary | ICD-10-CM | POA: Diagnosis not present

## 2018-03-31 DIAGNOSIS — E119 Type 2 diabetes mellitus without complications: Secondary | ICD-10-CM

## 2018-03-31 DIAGNOSIS — I10 Essential (primary) hypertension: Secondary | ICD-10-CM

## 2018-03-31 MED ORDER — AMLODIPINE BESYLATE 10 MG PO TABS: 10.0000 mg | ORAL_TABLET | Freq: Every day | ORAL | 1 refills | Status: DC

## 2018-03-31 MED ORDER — LISINOPRIL-HYDROCHLOROTHIAZIDE 20-12.5 MG PO TABS: 1.0000 | ORAL_TABLET | Freq: Every day | ORAL | 1 refills | Status: DC

## 2018-03-31 MED ORDER — VENLAFAXINE HCL ER 150 MG PO CP24: 150.0000 mg | ORAL_CAPSULE | Freq: Every day | ORAL | 0 refills | Status: DC

## 2018-03-31 MED ORDER — GLIPIZIDE ER 2.5 MG PO TB24: 2.5000 mg | ORAL_TABLET | Freq: Every day | ORAL | 1 refills | Status: DC

## 2018-03-31 MED ORDER — METFORMIN HCL 1000 MG PO TABS: 1000.0000 mg | ORAL_TABLET | Freq: Two times a day (BID) | ORAL | 1 refills | Status: DC

## 2018-03-31 NOTE — Progress Notes (Signed)
Date:  03/31/2018   Name:  Andrew [name]   DOB:  1950-12-17   MRN:  [id number]   Chief Complaint: Depression (needs effexor increased)  Depression         The patient presents with no depression.  This is a chronic problem.  The current episode started more than 1 year ago.   The onset quality is gradual.   The problem occurs intermittently.  The problem has been waxing and waning since onset.  Associated symptoms include irritable.  Associated symptoms include no decreased concentration, no fatigue, no helplessness, no hopelessness, does not have insomnia, no restlessness, no decreased interest, no appetite change, no body aches, no myalgias, no headaches, no indigestion, not sad and no suicidal ideas.     The symptoms are aggravated by nothing.  Past treatments include SSRIs - Selective serotonin reuptake inhibitors.  Compliance with treatment is good.  Previous treatment provided moderate relief.   Pertinent negatives include no anxiety and no depression. Diabetes  He presents for his follow-up diabetic visit. He has type 2 diabetes mellitus. His disease course has been stable. Pertinent negatives for hypoglycemia include no confusion, dizziness, headaches, hunger, mood changes, nervousness/anxiousness, pallor, seizures, sleepiness, speech difficulty, sweats or tremors. There are no diabetic associated symptoms. Pertinent negatives for diabetes include no blurred vision, no chest pain, no fatigue, no foot paresthesias, no polydipsia, no polyphagia, no polyuria, no visual change, no weakness and no weight loss. There are no hypoglycemic complications. Symptoms are stable. Pertinent negatives for diabetic complications include no autonomic neuropathy, CVA, heart disease, impotence, nephropathy, peripheral neuropathy, PVD or retinopathy. Risk factors for coronary artery disease include diabetes mellitus, dyslipidemia and hypertension. Current diabetic treatment includes oral agent (dual therapy). He is  following a generally healthy diet. Meal planning includes avoidance of concentrated sweets and carbohydrate counting. He participates in exercise intermittently. His home blood glucose trend is fluctuating minimally. An ACE inhibitor/angiotensin II receptor blocker is being taken.  Hypertension  This is a chronic problem. The current episode started in the past 7 days. The problem is controlled. Pertinent negatives include no anxiety, blurred vision, chest pain, headaches, malaise/fatigue, neck pain, orthopnea, palpitations, peripheral edema, PND, shortness of breath or sweats. There are no associated agents to hypertension. There are no known risk factors for coronary artery disease. Past treatments include ACE inhibitors, diuretics and calcium channel blockers. The current treatment provides moderate improvement. There is no history of CVA, PVD or retinopathy.    Review of Systems  Constitutional: Negative for appetite change, chills, fatigue, fever, malaise/fatigue and weight loss.  HENT: Negative for drooling, ear discharge, ear pain and sore throat.   Eyes: Negative for blurred vision.  Respiratory: Negative for cough, shortness of breath and wheezing.   Cardiovascular: Negative for chest pain, palpitations, orthopnea, leg swelling and PND.  Gastrointestinal: Negative for abdominal pain, blood in stool, constipation, diarrhea and nausea.  Endocrine: Negative for polydipsia, polyphagia and polyuria.  Genitourinary: Negative for dysuria, frequency, hematuria, impotence and urgency.  Musculoskeletal: Negative for back pain, myalgias and neck pain.  Skin: Negative for pallor and rash.  Allergic/Immunologic: Negative for environmental allergies.  Neurological: Negative for dizziness, tremors, seizures, speech difficulty, weakness and headaches.  Hematological: Does not bruise/bleed easily.  Psychiatric/Behavioral: Positive for depression. Negative for confusion, decreased concentration and  suicidal ideas. The patient is not nervous/anxious and does not have insomnia.     Patient Active Problem List   Diagnosis Date Noted  . Mixed hyperlipidemia 10/05/2016  .  Familial multiple lipoprotein-type hyperlipidemia 08/02/2014  . Routine general medical examination at a health care facility 08/02/2014  . Recurrent major depressive episodes ([place]) 08/02/2014  . Essential (primary) hypertension 08/02/2014  . Overweight 08/02/2014  . Diabetes mellitus, type 2 ([place]) 08/02/2014    No Known Allergies  Past Surgical History:  Procedure Laterality Date  . NASAL SINUS SURGERY      Social History   Tobacco Use  . Smoking status: Never Smoker  . Smokeless tobacco: Never Used  . Tobacco comment: smoking cessation materials not required  Substance Use Topics  . Alcohol use: No    Alcohol/week: 0.0 standard drinks    Frequency: Never  . Drug use: No     Medication list has been reviewed and updated.  Current Meds  Medication Sig  . amLODipine (NORVASC) 10 MG tablet Take 1 tablet (10 mg total) by mouth daily.  Marland Kitchen glipiZIDE (GLUCOTROL XL) 2.5 MG 24 hr tablet Take 1 tablet (2.5 mg total) by mouth daily with breakfast.  . lisinopril-hydrochlorothiazide (PRINZIDE,ZESTORETIC) 20-12.5 MG tablet Take 1 tablet by mouth daily.  . metFORMIN (GLUCOPHAGE) 1000 MG tablet Take 1 tablet (1,000 mg total) by mouth 2 (two) times daily.  Marland Kitchen venlafaxine XR (EFFEXOR-XR) 75 MG 24 hr capsule Take 1 capsule (75 mg total) by mouth daily.    PHQ 2/9 Scores 03/31/2018 10/26/2017 04/28/2017 10/05/2016  PHQ - 2 Score 0 0 0 0  PHQ- 9 Score 1 0 - -    Physical Exam Vitals signs and nursing note reviewed.  Constitutional:      General: He is irritable.  HENT:     Head: Normocephalic.     Right Ear: External ear normal.     Left Ear: External ear normal.     Nose: Nose normal.  Eyes:     General: No scleral icterus.       Right eye: No discharge.        Left eye: No discharge.     Conjunctiva/sclera:  Conjunctivae normal.     Pupils: Pupils are equal, round, and reactive to light.  Neck:     Musculoskeletal: Normal range of motion and neck supple.     Thyroid: No thyromegaly.     Vascular: No JVD.     Trachea: No tracheal deviation.  Cardiovascular:     Rate and Rhythm: Normal rate and regular rhythm.     Heart sounds: Normal heart sounds. No murmur. No friction rub. No gallop.   Pulmonary:     Effort: No respiratory distress.     Breath sounds: Normal breath sounds. No wheezing or rales.  Abdominal:     General: Bowel sounds are normal.     Palpations: Abdomen is soft. There is no mass.     Tenderness: There is no abdominal tenderness. There is no guarding or rebound.  Musculoskeletal: Normal range of motion.        General: No tenderness.  Lymphadenopathy:     Cervical: No cervical adenopathy.  Skin:    General: Skin is warm.     Findings: No rash.  Neurological:     Mental Status: He is alert and oriented to person, place, and time.     Cranial Nerves: No cranial nerve deficit.     Deep Tendon Reflexes: Reflexes are normal and symmetric.     BP 140/80   Pulse 60   Ht 5\' 11"  (1.803 m)   Wt 189 lb (85.7 kg)   BMI 26.36 kg/m  Assessment and Plan: .........1. Recurrent major depressive episodes ([place]) .  Uncontrolled.  Chronic.  Increase venlafaxine XR to 150 take 1 capsule a day breakthrough irritability. - venlafaxine XR (EFFEXOR-XR) 150 MG 24 hr capsule; Take 1 capsule (150 mg total) by mouth daily with breakfast.  Dispense: 60 capsule; Refill: 0  2. Essential (primary) hypertension Chronic.  Controlled.  Continue amlodipine 10 mg and lisinopril hydrochlorothiazide 20-12 0.5.  Will check renal function panel. - amLODipine (NORVASC) 10 MG tablet; Take 1 tablet (10 mg total) by mouth daily.  Dispense: 90 tablet; Refill: 1 - lisinopril-hydrochlorothiazide (PRINZIDE,ZESTORETIC) 20-12.5 MG tablet; Take 1 tablet by mouth daily.  Dispense: 90 tablet; Refill: 1 - Renal  Function Panel  3. Type 2 diabetes mellitus without complication, without long-term current use of insulin (HCC) Chronic.  Controlled.  Will check A1c and renal function panel to adjust accordingly presently on glipizide 2.5 mg once a day and metformin1000mg  1 twice a day  - glipiZIDE (GLUCOTROL XL) 2.5 MG 24 hr tablet; Take 1 tablet (2.5 mg total) by mouth daily with breakfast.  Dispense: 90 tablet; Refill: 1 - metFORMIN (GLUCOPHAGE) 1000 MG tablet; Take 1 tablet (1,000 mg total) by mouth 2 (two) times daily.  Dispense: 180 tablet; Refill: 1 - Hemoglobin A1c - Renal Function Panel

## 2018-04-01 LAB — RENAL FUNCTION PANEL
Albumin: 5 g/dL — ABNORMAL HIGH (ref 3.6–4.8)
BUN / CREAT RATIO: 18 (ref 10–24)
BUN: 16 mg/dL (ref 8–27)
CHLORIDE: 98 mmol/L (ref 96–106)
CO2: 25 mmol/L (ref 20–29)
CREATININE: 0.89 mg/dL (ref 0.76–1.27)
Calcium: 9.9 mg/dL (ref 8.6–10.2)
GFR calc Af Amer: 103 mL/min/{1.73_m2} (ref >59)
GFR, EST NON AFRICAN AMERICAN: 89 mL/min/{1.73_m2} (ref >59)
Glucose: 85 mg/dL (ref 65–99)
Phosphorus: 3 mg/dL (ref 2.5–4.5)
Potassium: 3.9 mmol/L (ref 3.5–5.2)
Sodium: 142 mmol/L (ref 134–144)

## 2018-04-01 LAB — HEMOGLOBIN A1C
Est. average glucose Bld gHb Est-mCnc: 117 mg/dL
HEMOGLOBIN A1C: 5.7 % — AB (ref 4.8–5.6)

## 2018-04-21 ENCOUNTER — Other Ambulatory Visit: Payer: Self-pay | Admitting: Family Medicine

## 2018-04-21 DIAGNOSIS — F339 Major depressive disorder, recurrent, unspecified: Secondary | ICD-10-CM

## 2018-04-22 ENCOUNTER — Other Ambulatory Visit: Payer: Self-pay | Admitting: Family Medicine

## 2018-04-22 DIAGNOSIS — M1990 Unspecified osteoarthritis, unspecified site: Secondary | ICD-10-CM

## 2018-04-23 ENCOUNTER — Other Ambulatory Visit: Payer: Self-pay | Admitting: Family Medicine

## 2018-04-23 DIAGNOSIS — F339 Major depressive disorder, recurrent, unspecified: Secondary | ICD-10-CM

## 2018-05-03 ENCOUNTER — Ambulatory Visit (INDEPENDENT_AMBULATORY_CARE_PROVIDER_SITE_OTHER): Payer: PPO

## 2018-05-03 ENCOUNTER — Other Ambulatory Visit: Payer: Self-pay

## 2018-05-03 VITALS — BP 122/82 | HR 59 | Temp 97.4°F | Resp 16 | Ht 71.0 in | Wt 185.8 lb

## 2018-05-03 DIAGNOSIS — Z1211 Encounter for screening for malignant neoplasm of colon: Secondary | ICD-10-CM

## 2018-05-03 DIAGNOSIS — Z1159 Encounter for screening for other viral diseases: Secondary | ICD-10-CM | POA: Diagnosis not present

## 2018-05-03 DIAGNOSIS — Z23 Encounter for immunization: Secondary | ICD-10-CM | POA: Diagnosis not present

## 2018-05-03 DIAGNOSIS — Z0000 Encounter for general adult medical examination without abnormal findings: Secondary | ICD-10-CM

## 2018-05-03 NOTE — Progress Notes (Signed)
Subjective:   Andrew [name] is a 68 y.o. male who presents for Medicare Annual/Subsequent preventive examination.  Review of Systems:   Cardiac Risk Factors include: advanced age (>21men, >70 women);diabetes mellitus;hypertension;dyslipidemia;male gender     Objective:    Vitals: BP 122/82 (BP Location: Left Arm, Patient Position: Sitting, Cuff Size: Normal)   Pulse (!) 59   Temp (!) 97.4 F (36.3 C) (Oral)   Resp 16   Ht 5\' 11"  (1.803 m)   Wt 185 lb 12.8 oz (84.3 kg)   SpO2 94%   BMI 25.91 kg/m   Body mass index is 25.91 kg/m.  Advanced Directives 05/03/2018 04/28/2017  Does Patient Have a Medical Advance Directive? No No  Would patient like information on creating a medical advance directive? Yes (MAU/Ambulatory/Procedural Areas - Information given) Yes (MAU/Ambulatory/Procedural Areas - Information given)    Tobacco Social History   Tobacco Use  Smoking Status Never Smoker  Smokeless Tobacco Never Used  Tobacco Comment   smoking cessation materials not required     Counseling given: Not Answered Comment: smoking cessation materials not required   Clinical Intake:  Pre-visit preparation completed: Yes  Pain : No/denies pain     BMI - recorded: 25.91 Nutritional Status: BMI 25 -29 Overweight Nutritional Risks: None Diabetes: Yes CBG done?: No Did pt. bring in CBG monitor from home?: No   Nutrition Risk Assessment:  Has the patient had any N/V/D within the last 2 months?  No  Does the patient have any non-healing wounds?  No  Has the patient had any unintentional weight loss or weight gain?  No   Diabetes:  Is the patient diabetic?  Yes  If diabetic, was a CBG obtained today?  No  Did the patient bring in their glucometer from home?  No  How often do you monitor your CBG's? Pt does not actively check his blood sugar.   Financial Strains and Diabetes Management:  Are you having any financial strains with the device, your supplies or your  medication? No .  Does the patient want to be seen by Chronic Care Management for management of their diabetes?  No  Would the patient like to be referred to a Nutritionist or for Diabetic Management?  No   Diabetic Exams:  Diabetic Eye Exam: Completed 2019 per patient. Records requested.   Diabetic Foot Exam: Completed 05/06/17.   How often do you need to have someone help you when you read instructions, pamphlets, or other written materials from your doctor or pharmacy?: 1 - Never What is the last grade level you completed in school?: Associates degree  Interpreter Needed?: No  Information entered by :: [name] LPN  Past Medical History:  Diagnosis Date  . Depression   . Diabetes mellitus without complication ([place])   . Hyperlipidemia   . Hypertension    Past Surgical History:  Procedure Laterality Date  . NASAL SINUS SURGERY     Family History  Problem Relation Age of Onset  . Heart disease Mother   . Healthy Father    Social History   Socioeconomic History  . Marital status: Married    Spouse name: Not on file  . Number of children: 1  . Years of education: Not on file  . Highest education level: Associate degree: academic program  Occupational History  . Occupation: Retired  Scientific laboratory technician  . Financial resource strain: Not hard at all  . Food insecurity:    Worry: Never true  Inability: Never true  . Transportation needs:    Medical: No    Non-medical: No  Tobacco Use  . Smoking status: Never Smoker  . Smokeless tobacco: Never Used  . Tobacco comment: smoking cessation materials not required  Substance and Sexual Activity  . Alcohol use: Yes    Alcohol/week: 0.0 standard drinks    Frequency: Never    Comment: occaisonal  . Drug use: No  . Sexual activity: Not Currently  Lifestyle  . Physical activity:    Days per week: 7 days    Minutes per session: 30 min  . Stress: Not at all  Relationships  . Social connections:    Talks on phone: More  than three times a week    Gets together: Twice a week    Attends religious service: Never    Active member of club or organization: No    Attends meetings of clubs or organizations: Never    Relationship status: Married  Other Topics Concern  . Not on file  Social History Narrative  . Not on file    Outpatient Encounter Medications as of 05/03/2018  Medication Sig  . amLODipine (NORVASC) 10 MG tablet Take 1 tablet (10 mg total) by mouth daily.  Marland Kitchen etodolac (LODINE) 500 MG tablet TAKE 1 TABLET BY MOUTH TWICE A DAY  . glipiZIDE (GLUCOTROL XL) 2.5 MG 24 hr tablet Take 1 tablet (2.5 mg total) by mouth daily with breakfast.  . lisinopril-hydrochlorothiazide (PRINZIDE,ZESTORETIC) 20-12.5 MG tablet Take 1 tablet by mouth daily.  . metFORMIN (GLUCOPHAGE) 1000 MG tablet Take 1 tablet (1,000 mg total) by mouth 2 (two) times daily.  Marland Kitchen venlafaxine XR (EFFEXOR-XR) 150 MG 24 hr capsule TAKE 1 CAPSULE (150 MG TOTAL) BY MOUTH DAILY WITH BREAKFAST.   No facility-administered encounter medications on file as of 05/03/2018.     Activities of Daily Living In your present state of health, do you have any difficulty performing the following activities: 05/03/2018  Hearing? N  Comment declines hearing aids  Vision? N  Comment wears glasses  Difficulty concentrating or making decisions? N  Walking or climbing stairs? N  Dressing or bathing? N  Doing errands, shopping? N  Preparing Food and eating ? N  Using the Toilet? N  In the past six months, have you accidently leaked urine? N  Do you have problems with loss of bowel control? N  Managing your Medications? N  Managing your Finances? N  Housekeeping or managing your Housekeeping? N  Some recent data might be hidden    Patient Care Team: Juline Patch, MD as PCP - General (Family Medicine)   Assessment:   This is a routine wellness examination for Andrew [name].  Exercise Activities and Dietary recommendations Current Exercise Habits: Structured  exercise class;Home exercise routine, Type of exercise: walking;strength training/weights;calisthenics, Time (Minutes): 30, Frequency (Times/Week): 7, Weekly Exercise (Minutes/Week): 210, Intensity: Moderate, Exercise limited by: None identified  Goals    . DIET - INCREASE WATER INTAKE     Recommend to drink at least 6-8 8oz glasses of water per day.    . Patient Stated     Patient states he would like to maintain health with physical activity and healthy eating.        Fall Risk Fall Risk  05/03/2018 04/28/2017 10/05/2016 08/07/2015  Falls in the past year? 1 No No No  Number falls in past yr: 1 - - -  Injury with Fall? 0 - - -  Follow up Falls prevention  discussed - - -   FALL RISK PREVENTION PERTAINING TO THE HOME:  Any stairs in or around the home WITH handrails? Yes  Home free of loose throw rugs in walkways, pet beds, electrical cords, etc? Yes  Adequate lighting in your home to reduce risk of falls? Yes   ASSISTIVE DEVICES UTILIZED TO PREVENT FALLS:  Life alert? Yes  Use of a cane, walker or w/c? No  Grab bars in the bathroom? No  Shower chair or bench in shower? Yes Elevated toilet seat or a handicapped toilet? No   DME ORDERS:  DME order needed?  No   TIMED UP AND GO:  Was the test performed? Yes .  Length of time to ambulate 10 feet: 5 sec.   GAIT:  Appearance of gait: Gait stead-fast and without the use of an assistive device. Education: Fall risk prevention has been discussed.  Intervention(s) required? No   Depression Screen PHQ 2/9 Scores 05/03/2018 03/31/2018 10/26/2017 04/28/2017  PHQ - 2 Score 0 0 0 0  PHQ- 9 Score 1 1 0 -    Cognitive Function     6CIT Screen 05/03/2018 04/28/2017  What Year? 0 points 0 points  What month? 0 points 0 points  What time? 0 points 0 points  Count back from 20 0 points 0 points  Months in reverse 0 points 0 points  Repeat phrase 0 points 0 points  Total Score 0 0    Immunization History  Administered Date(s)  Administered  . Influenza, High Dose Seasonal PF 04/28/2017, 05/03/2018  . Influenza,inj,Quad PF,6+ Mos 02/17/2015  . Pneumococcal Conjugate-13 04/28/2017  . Pneumococcal Polysaccharide-23 05/03/2018  . Tdap 05/06/2017    Qualifies for Shingles Vaccine? Yes . Due for Shingrix. Education has been provided regarding the importance of this vaccine. Pt has been advised to call insurance company to determine out of pocket expense. Advised may also receive vaccine at local pharmacy or Health Dept. Verbalized acceptance and understanding.  Tdap: Up to date  Flu Vaccine: Due for Flu vaccine. Does the patient want to receive this vaccine today?  No . Education has been provided regarding the importance of this vaccine but still declined. Advised may receive this vaccine at local pharmacy or Health Dept. Aware to provide a copy of the vaccination record if obtained from local pharmacy or Health Dept. Verbalized acceptance and understanding.  Pneumococcal Vaccine: Due for Pneumococcal vaccine. Does the patient want to receive this vaccine today?  Yes . Education has been provided regarding the importance of this vaccine but still declined. Advised may receive this vaccine at local pharmacy or Health Dept. Aware to provide a copy of the vaccination record if obtained from local pharmacy or Health Dept. Verbalized acceptance and understanding.   Screening Tests Health Maintenance  Topic Date Due  . Hepatitis C Screening  1950/12/31  . INFLUENZA VACCINE  10/27/2017  . PNA vac Low Risk Adult (2 of 2 - PPSV23) 04/28/2018  . FOOT EXAM  05/06/2018  . HEMOGLOBIN A1C  09/29/2018  . COLONOSCOPY  02/20/2019  . TETANUS/TDAP  05/07/2027  . OPHTHALMOLOGY EXAM  Discontinued   Cancer Screenings:  Colorectal Screening: Completed 02/19/09. Repeat every 10 years; No longer required. Referral to GI placed today. Pt aware the office will call re: appt.  Lung Cancer Screening: (Low Dose CT Chest recommended if Age  46-80 years, 30 pack-year currently smoking OR have quit w/in 15years.) does not qualify.    Additional Screening:  Hepatitis C Screening: does qualify;  ordered today.  Vision Screening: Recommended annual ophthalmology exams for early detection of glaucoma and other disorders of the eye. Is the patient up to date with their annual eye exam?  Yes  Who is the provider or what is the name of the office in which the pt attends annual eye exams? [facility] Screening: Recommended annual dental exams for proper oral hygiene  Community Resource Referral:  CRR required this visit?  No       Plan:    I have personally reviewed and addressed the Medicare Annual Wellness questionnaire and have noted the following in the patient's chart:  A. Medical and social history B. Use of alcohol, tobacco or illicit drugs  C. Current medications and supplements D. Functional ability and status E.  Nutritional status F.  Physical activity G. Advance directives H. List of other physicians I.  Hospitalizations, surgeries, and ER visits in previous 12 months J.  [place] such as hearing and vision if needed, cognitive and depression L. Referrals and appointments   In addition, I have reviewed and discussed with patient certain preventive protocols, quality metrics, and best practice recommendations. A written personalized care plan for preventive services as well as general preventive health recommendations were provided to patient.   Signed,  [name], LPN Nurse Health Advisor   Nurse Notes: pt doing well and appreciative of visit today.

## 2018-05-03 NOTE — Patient Instructions (Addendum)
Andrew [name] , Thank you for taking time to come for your Medicare Wellness Visit. I appreciate your ongoing commitment to your health goals. Please review the following plan we discussed and let me know if I can assist you in the future.   Screening recommendations/referrals: Colonoscopy: done 02/19/09. Referral sent to gastroenterology for colonoscopy screening. They will contact you to set up an appt.  Recommended yearly ophthalmology/optometry visit for glaucoma screening and checkup Recommended yearly dental visit for hygiene and checkup  Vaccinations: Influenza vaccine: done todayPneumococcal Polysaccharide Vaccine: What You Need to Know 1. Why get vaccinated? Vaccination can protect older adults (and some children and younger adults) from pneumococcal disease. Pneumococcal disease is caused by bacteria that can spread from person to person through close contact. It can cause ear infections, and it can also lead to more serious infections of the:  Lungs (pneumonia),  Blood (bacteremia), and  Covering of the brain and spinal cord (meningitis). Meningitis can cause deafness and brain damage, and it can be fatal. Anyone can get pneumococcal disease, but children under 25 years of age, people with certain medical conditions, adults over 30 years of age, and cigarette smokers are at the highest risk. About 18,000 older adults die each year from pneumococcal disease in the [place]. Treatment of pneumococcal infections with penicillin and other drugs used to be more effective. But some strains of the disease have become resistant to these drugs. This makes prevention of the disease, through vaccination, even more important. 2. Pneumococcal polysaccharide vaccine (PPSV23) Pneumococcal polysaccharide vaccine (PPSV23) protects against 23 types of pneumococcal bacteria. It will not prevent all pneumococcal disease. PPSV23 is recommended for:  All adults 33 years of age and older,  Anyone 2  through 66 years of age with certain long-term health problems,  Anyone 2 through 67 years of age with a weakened immune system,  Adults 35 through 67 years of age who smoke cigarettes or have asthma. Most people need only one dose of PPSV. A second dose is recommended for certain high-risk groups. People 36 and older should get a dose even if they have gotten one or more doses of the vaccine before they turned 65. Your healthcare provider can give you more information about these recommendations. Most healthy adults develop protection within 2 to 3 weeks of getting the shot. 3. Some people should not get this vaccine  Anyone who has had a life-threatening allergic reaction to PPSV should not get another dose.  Anyone who has a severe allergy to any component of PPSV should not receive it. Tell your provider if you have any severe allergies.  Anyone who is moderately or severely ill when the shot is scheduled may be asked to wait until they recover before getting the vaccine. Someone with a mild illness can usually be vaccinated.  Children less than 20 years of age should not receive this vaccine.  There is no evidence that PPSV is harmful to either a pregnant woman or to her fetus. However, as a precaution, women who need the vaccine should be vaccinated before becoming pregnant, if possible. 4. Risks of a vaccine reaction With any medicine, including vaccines, there is a chance of side effects. These are usually mild and go away on their own, but serious reactions are also possible. About half of people who get PPSV have mild side effects, such as redness or pain where the shot is given, which go away within about two days. Less than 1 out of 100 people  develop a fever, muscle aches, or more severe local reactions. Problems that could happen after any vaccine:  People sometimes faint after a medical procedure, including vaccination. Sitting or lying down for about 15 minutes can help  prevent fainting, and injuries caused by a fall. Tell your doctor if you feel dizzy, or have vision changes or ringing in the ears.  Some people get severe pain in the shoulder and have difficulty moving the arm where a shot was given. This happens very rarely.  Any medication can cause a severe allergic reaction. Such reactions from a vaccine are very rare, estimated at about 1 in a million doses, and would happen within a few minutes to a few hours after the vaccination. As with any medicine, there is a very remote chance of a vaccine causing a serious injury or death. The safety of vaccines is always being monitored. For more information, visit: http://www.aguilar.org/ 5. What if there is a serious reaction? What should I look for? Look for anything that concerns you, such as signs of a severe allergic reaction, very high fever, or unusual behavior. Signs of a severe allergic reaction can include hives, swelling of the face and throat, difficulty breathing, a fast heartbeat, dizziness, and weakness. These would usually start a few minutes to a few hours after the vaccination. What should I do? If you think it is a severe allergic reaction or other emergency that can't wait, call 9-1-1 or get to the nearest hospital. Otherwise, call your doctor. Afterward, the reaction should be reported to the Vaccine Adverse Event Reporting System (VAERS). Your doctor might file this report, or you can do it yourself through the VAERS web site at www.vaers.SamedayNews.es, or by calling 309-273-0801. VAERS does not give medical advice. 6. How can I learn more?  Ask your doctor. He or she can give you the vaccine package insert or suggest other sources of information.  Call your local or state health department.  Contact the Centers for Disease Control and Prevention (CDC): ? Call (773)279-0532 (1-800-CDC-INFO) or ? Visit CDC's website at http://hunter.com/ CDC Vaccine Information Statement PPSV Vaccine  (07/20/2013) This information is not intended to replace advice given to you by your health care provider. Make sure you discuss any questions you have with your health care provider. Document Released: 01/10/2006 Document Revised: 10/25/2017 Document Reviewed: 10/25/2017 Elsevier Interactive Patient Education  2019 [place]. Influenza (Flu) Vaccine (Inactivated or Recombinant): What You Need to Know 1. Why get vaccinated? Influenza vaccine can prevent influenza (flu). Flu is a contagious disease that spreads around the [place] every year, usually between October and May. Anyone can get the flu, but it is more dangerous for some people. Infants and young children, people 1 years of age and older, pregnant women, and people with certain health conditions or a weakened immune system are at greatest risk of flu complications. Pneumonia, bronchitis, sinus infections and ear infections are examples of flu-related complications. If you have a medical condition, such as heart disease, cancer or diabetes, flu can make it worse. Flu can cause fever and chills, sore throat, muscle aches, fatigue, cough, headache, and runny or stuffy nose. Some people may have vomiting and diarrhea, though this is more common in children than adults. Each year thousands of people in the [place] die from flu, and many more are hospitalized. Flu vaccine prevents millions of illnesses and flu-related visits to the doctor each year. 2. Influenza vaccine CDC recommends everyone 57 months of age and older  get vaccinated every flu season. Children 6 months through 44 years of age may need 2 doses during a single flu season. Everyone else needs only 1 dose each flu season. It takes about 2 weeks for protection to develop after vaccination. There are many flu viruses, and they are always changing. Each year a new flu vaccine is made to protect against three or four viruses that are likely to cause disease in the upcoming  flu season. Even when the vaccine doesn't exactly match these viruses, it may still provide some protection. Influenza vaccine does not cause flu. Influenza vaccine may be given at the same time as other vaccines. 3. Talk with your health care provider Tell your vaccine provider if the person getting the vaccine:  Has had an allergic reaction after a previous dose of influenza vaccine, or has any severe, life-threatening allergies.  Has ever had Guillain-Barr Syndrome (also called GBS). In some cases, your health care provider may decide to postpone influenza vaccination to a future visit. People with minor illnesses, such as a cold, may be vaccinated. People who are moderately or severely ill should usually wait until they recover before getting influenza vaccine. Your health care provider can give you more information. 4. Risks of a vaccine reaction  Soreness, redness, and swelling where shot is given, fever, muscle aches, and headache can happen after influenza vaccine.  There may be a very small increased risk of Guillain-Barr Syndrome (GBS) after inactivated influenza vaccine (the flu shot). Young children who get the flu shot along with pneumococcal vaccine (PCV13), and/or DTaP vaccine at the same time might be slightly more likely to have a seizure caused by fever. Tell your health care provider if a child who is getting flu vaccine has ever had a seizure. People sometimes faint after medical procedures, including vaccination. Tell your provider if you feel dizzy or have vision changes or ringing in the ears. As with any medicine, there is a very remote chance of a vaccine causing a severe allergic reaction, other serious injury, or death. 5. What if there is a serious problem? An allergic reaction could occur after the vaccinated person leaves the clinic. If you see signs of a severe allergic reaction (hives, swelling of the face and throat, difficulty breathing, a fast heartbeat,  dizziness, or weakness), call 9-1-1 and get the person to the nearest hospital. For other signs that concern you, call your health care provider. Adverse reactions should be reported to the Vaccine Adverse Event Reporting System (VAERS). Your health care provider will usually file this report, or you can do it yourself. Visit the VAERS website at www.vaers.SamedayNews.es or call 2346827547.VAERS is only for reporting reactions, and VAERS staff do not give medical advice. 6. The National Vaccine Injury Compensation Program The [employer] Vaccine Injury Compensation Program (VICP) is a federal program that was created to compensate people who may have been injured by certain vaccines. Visit the VICP website at GoldCloset.com.ee or call 2201384094 to learn about the program and about filing a claim. There is a time limit to file a claim for compensation. 7. How can I learn more?  Ask your healthcare provider.  Call your local or state health department.  Contact the Centers for Disease Control and Prevention (CDC): ? Call (505)618-2106 (1-800-CDC-INFO) or ? Visit CDC's https://gibson.com/ Vaccine Information Statement (Interim) Inactivated Influenza Vaccine (11/10/2017) This information is not intended to replace advice given to you by your health care provider. Make sure you discuss any questions you have  with your health care provider. Document Released: 01/07/2006 Document Revised: 11/14/2017 Document Reviewed: 11/14/2017 Elsevier Interactive Patient Education  2019 [employer] Inc.  Pneumococcal vaccine: done today  Tdap vaccine: done 05/06/17 Shingles vaccine: Shingrix discussed. Please contact your pharmacy for coverage information.     Advanced directives: Advance directive discussed with you today. I have provided a copy for you to complete at home and have notarized. Once this is complete please bring a copy in to our office so we can scan it into your chart.  Conditions/risks  identified: Keep up the great work!  Next appointment: Please follow up in one year for your Medicare Annual Wellness visit.    Preventive Care 58 Years and Older, Male Preventive care refers to lifestyle choices and visits with your health care provider that can promote health and wellness. What does preventive care include?  A yearly physical exam. This is also called an annual well check.  Dental exams once or twice a year.  Routine eye exams. Ask your health care provider how often you should have your eyes checked.  Personal lifestyle choices, including:  Daily care of your teeth and gums.  Regular physical activity.  Eating a healthy diet.  Avoiding tobacco and drug use.  Limiting alcohol use.  Practicing safe sex.  Taking low doses of aspirin every day.  Taking vitamin and mineral supplements as recommended by your health care provider. What happens during an annual well check? The services and screenings done by your health care provider during your annual well check will depend on your age, overall health, lifestyle risk factors, and family history of disease. Counseling  Your health care provider may ask you questions about your:  Alcohol use.  Tobacco use.  Drug use.  Emotional well-being.  Home and relationship well-being.  Sexual activity.  Eating habits.  History of falls.  Memory and ability to understand (cognition).  Work and work Statistician. Screening  You may have the following tests or measurements:  Height, weight, and BMI.  Blood pressure.  Lipid and cholesterol levels. These may be checked every 5 years, or more frequently if you are over 30 years old.  Skin check.  Lung cancer screening. You may have this screening every year starting at age 46 if you have a 30-pack-year history of smoking and currently smoke or have quit within the past 15 years.  Fecal occult blood test (FOBT) of the stool. You may have this test every  year starting at age 68.  Flexible sigmoidoscopy or colonoscopy. You may have a sigmoidoscopy every 5 years or a colonoscopy every 10 years starting at age 47.  Prostate cancer screening. Recommendations will vary depending on your family history and other risks.  Hepatitis C blood test.  Hepatitis B blood test.  Sexually transmitted disease (STD) testing.  Diabetes screening. This is done by checking your blood sugar (glucose) after you have not eaten for a while (fasting). You may have this done every 1-3 years.  Abdominal aortic aneurysm (AAA) screening. You may need this if you are a current or former smoker.  Osteoporosis. You may be screened starting at age 27 if you are at high risk. Talk with your health care provider about your test results, treatment options, and if necessary, the need for more tests. Vaccines  Your health care provider may recommend certain vaccines, such as:  Influenza vaccine. This is recommended every year.  Tetanus, diphtheria, and acellular pertussis (Tdap, Td) vaccine. You may need a Td booster  every 10 years.  Zoster vaccine. You may need this after age 50.  Pneumococcal 13-valent conjugate (PCV13) vaccine. One dose is recommended after age 67.  Pneumococcal polysaccharide (PPSV23) vaccine. One dose is recommended after age 55. Talk to your health care provider about which screenings and vaccines you need and how often you need them. This information is not intended to replace advice given to you by your health care provider. Make sure you discuss any questions you have with your health care provider. Document Released: 04/11/2015 Document Revised: 12/03/2015 Document Reviewed: 01/14/2015 Elsevier Interactive Patient Education  2017 [place] Prevention in the Home Falls can cause injuries. They can happen to people of all ages. There are many things you can do to make your home safe and to help prevent falls. What can I do on the  outside of my home?  Regularly fix the edges of walkways and driveways and fix any cracks.  Remove anything that might make you trip as you walk through a door, such as a raised step or threshold.  Trim any bushes or trees on the path to your home.  Use bright outdoor lighting.  Clear any walking paths of anything that might make someone trip, such as rocks or tools.  Regularly check to see if handrails are loose or broken. Make sure that both sides of any steps have handrails.  Any raised decks and porches should have guardrails on the edges.  Have any leaves, snow, or ice cleared regularly.  Use sand or salt on walking paths during winter.  Clean up any spills in your garage right away. This includes oil or grease spills. What can I do in the bathroom?  Use night lights.  Install grab bars by the toilet and in the tub and shower. Do not use towel bars as grab bars.  Use non-skid mats or decals in the tub or shower.  If you need to sit down in the shower, use a plastic, non-slip stool.  Keep the floor dry. Clean up any water that spills on the floor as soon as it happens.  Remove soap buildup in the tub or shower regularly.  Attach bath mats securely with double-sided non-slip rug tape.  Do not have throw rugs and other things on the floor that can make you trip. What can I do in the bedroom?  Use night lights.  Make sure that you have a light by your bed that is easy to reach.  Do not use any sheets or blankets that are too big for your bed. They should not hang down onto the floor.  Have a firm chair that has side arms. You can use this for support while you get dressed.  Do not have throw rugs and other things on the floor that can make you trip. What can I do in the kitchen?  Clean up any spills right away.  Avoid walking on wet floors.  Keep items that you use a lot in easy-to-reach places.  If you need to reach something above you, use a strong step  stool that has a grab bar.  Keep electrical cords out of the way.  Do not use floor polish or wax that makes floors slippery. If you must use wax, use non-skid floor wax.  Do not have throw rugs and other things on the floor that can make you trip. What can I do with my stairs?  Do not leave any items on the stairs.  Make  sure that there are handrails on both sides of the stairs and use them. Fix handrails that are broken or loose. Make sure that handrails are as long as the stairways.  Check any carpeting to make sure that it is firmly attached to the stairs. Fix any carpet that is loose or worn.  Avoid having throw rugs at the top or bottom of the stairs. If you do have throw rugs, attach them to the floor with carpet tape.  Make sure that you have a light switch at the top of the stairs and the bottom of the stairs. If you do not have them, ask someone to add them for you. What else can I do to help prevent falls?  Wear shoes that:  Do not have high heels.  Have rubber bottoms.  Are comfortable and fit you well.  Are closed at the toe. Do not wear sandals.  If you use a stepladder:  Make sure that it is fully opened. Do not climb a closed stepladder.  Make sure that both sides of the stepladder are locked into place.  Ask someone to hold it for you, if possible.  Clearly mark and make sure that you can see:  Any grab bars or handrails.  First and last steps.  Where the edge of each step is.  Use tools that help you move around (mobility aids) if they are needed. These include:  Canes.  Walkers.  Scooters.  Crutches.  Turn on the lights when you go into a dark area. Replace any light bulbs as soon as they burn out.  Set up your furniture so you have a clear path. Avoid moving your furniture around.  If any of your floors are uneven, fix them.  If there are any pets around you, be aware of where they are.  Review your medicines with your doctor. Some  medicines can make you feel dizzy. This can increase your chance of falling. Ask your doctor what other things that you can do to help prevent falls. This information is not intended to replace advice given to you by your health care provider. Make sure you discuss any questions you have with your health care provider. Document Released: 01/09/2009 Document Revised: 08/21/2015 Document Reviewed: 04/19/2014 Elsevier Interactive Patient Education  2017 [employer].

## 2018-05-04 ENCOUNTER — Ambulatory Visit: Payer: PPO

## 2018-05-05 ENCOUNTER — Telehealth: Payer: Self-pay | Admitting: Gastroenterology

## 2018-05-05 NOTE — Telephone Encounter (Signed)
Pt is calling to r/s his procedure to a Wednesday Thursday or Friday to the first 2 weeks of March please

## 2018-05-05 NOTE — Telephone Encounter (Signed)
Patients colonoscopy has been rescheduled from 05/29/18 [place] Dr. Allen Norris to 06/08/18 [place] Dr. Candis Shine at [facility] has been informed.  Referral has been updated.  Thanks [employer]

## 2018-05-12 ENCOUNTER — Ambulatory Visit (INDEPENDENT_AMBULATORY_CARE_PROVIDER_SITE_OTHER): Payer: PPO | Admitting: Family Medicine

## 2018-05-12 ENCOUNTER — Encounter: Payer: Self-pay | Admitting: Family Medicine

## 2018-05-12 VITALS — BP 120/80 | HR 60 | Ht 71.0 in | Wt 187.0 lb

## 2018-05-12 DIAGNOSIS — I10 Essential (primary) hypertension: Secondary | ICD-10-CM | POA: Diagnosis not present

## 2018-05-12 DIAGNOSIS — F339 Major depressive disorder, recurrent, unspecified: Secondary | ICD-10-CM

## 2018-05-12 DIAGNOSIS — R351 Nocturia: Secondary | ICD-10-CM

## 2018-05-12 DIAGNOSIS — E119 Type 2 diabetes mellitus without complications: Secondary | ICD-10-CM

## 2018-05-12 MED ORDER — GLIPIZIDE ER 2.5 MG PO TB24: 2.5000 mg | ORAL_TABLET | Freq: Every day | ORAL | 1 refills | Status: DC

## 2018-05-12 MED ORDER — VENLAFAXINE HCL ER 150 MG PO CP24: 150.0000 mg | ORAL_CAPSULE | Freq: Every day | ORAL | 0 refills | Status: DC

## 2018-05-12 MED ORDER — AMLODIPINE BESYLATE 10 MG PO TABS: 10.0000 mg | ORAL_TABLET | Freq: Every day | ORAL | 1 refills | Status: DC

## 2018-05-12 MED ORDER — LISINOPRIL-HYDROCHLOROTHIAZIDE 20-12.5 MG PO TABS: 1.0000 | ORAL_TABLET | Freq: Every day | ORAL | 1 refills | Status: DC

## 2018-05-12 MED ORDER — METFORMIN HCL 1000 MG PO TABS: 1000.0000 mg | ORAL_TABLET | Freq: Two times a day (BID) | ORAL | 1 refills | Status: DC

## 2018-05-12 NOTE — Progress Notes (Signed)
Date:  05/12/2018   Name:  Andrew [name]   DOB:  Jul 26, 1950   MRN:  [id number]   Chief Complaint: Diabetes (needs diab foot exam); Hypertension; Depression (PHQ9=2); and Nocturia (wants psa)  Diabetes  He presents for his follow-up diabetic visit. He has type 2 diabetes mellitus. His disease course has been stable. There are no hypoglycemic associated symptoms. Pertinent negatives for hypoglycemia include no dizziness, headaches, nervousness/anxiousness or sweats. There are no diabetic associated symptoms. Pertinent negatives for diabetes include no blurred vision, no chest pain, no fatigue, no foot paresthesias, no foot ulcerations, no polydipsia, no polyphagia, no polyuria, no visual change, no weakness and no weight loss. There are no hypoglycemic complications. Symptoms are stable. There are no diabetic complications. Pertinent negatives for diabetic complications include no CVA, PVD or retinopathy. Risk factors for coronary artery disease include dyslipidemia, hypertension and male sex. Current diabetic treatment includes oral agent (dual therapy) and diet (glipizide/metformen). He is compliant with treatment all of the time. His weight is stable. He is following a generally healthy diet. Meal planning includes avoidance of concentrated sweets and carbohydrate counting. There is no change in his home blood glucose trend. An ACE inhibitor/angiotensin II receptor blocker is being taken. He does not see a podiatrist.Eye exam is current.  Hypertension  This is a chronic problem. The current episode started more than 1 year ago. The problem is unchanged. The problem is controlled. Pertinent negatives include no anxiety, blurred vision, chest pain, headaches, malaise/fatigue, neck pain, orthopnea, palpitations, peripheral edema, PND, shortness of breath or sweats. There are no known risk factors for coronary artery disease. Past treatments include ACE inhibitors and diuretics. There are no compliance  problems.  There is no history of angina, kidney disease, CAD/MI, CVA, heart failure, left ventricular hypertrophy, PVD or retinopathy. There is no history of chronic renal disease, a hypertension causing med or renovascular disease.  Depression         This is a chronic problem.  The current episode started more than 1 year ago.   The onset quality is gradual.   The problem has been gradually improving since onset.  Associated symptoms include no decreased concentration, no fatigue, no helplessness, no hopelessness, does not have insomnia, not irritable, no restlessness, no decreased interest, no appetite change, no body aches, no myalgias, no headaches, no indigestion, not sad and no suicidal ideas.  Past treatments include SNRIs - Serotonin and norepinephrine reuptake inhibitors.  Compliance with treatment is good.  Previous treatment provided moderate relief.   Pertinent negatives include no anxiety.   Review of Systems  Constitutional: Negative for appetite change, chills, fatigue, fever, malaise/fatigue and weight loss.  HENT: Negative for drooling, ear discharge, ear pain and sore throat.   Eyes: Negative for blurred vision.  Respiratory: Negative for cough, shortness of breath and wheezing.   Cardiovascular: Negative for chest pain, palpitations, orthopnea, leg swelling and PND.  Gastrointestinal: Negative for abdominal pain, blood in stool, constipation, diarrhea and nausea.  Endocrine: Negative for polydipsia, polyphagia and polyuria.  Genitourinary: Negative for dysuria, frequency, hematuria and urgency.       Nocturia2-3 times  Musculoskeletal: Negative for back pain, myalgias and neck pain.  Skin: Negative for rash.  Allergic/Immunologic: Negative for environmental allergies.  Neurological: Negative for dizziness, weakness and headaches.  Hematological: Does not bruise/bleed easily.  Psychiatric/Behavioral: Positive for depression. Negative for decreased concentration and suicidal  ideas. The patient is not nervous/anxious and does not have insomnia.  Patient Active Problem List   Diagnosis Date Noted  . Mixed hyperlipidemia 10/05/2016  . Familial multiple lipoprotein-type hyperlipidemia 08/02/2014  . Routine general medical examination at a health care facility 08/02/2014  . Recurrent major depressive episodes ([place]) 08/02/2014  . Essential (primary) hypertension 08/02/2014  . Overweight 08/02/2014  . Diabetes mellitus, type 2 ([place]) 08/02/2014    No Known Allergies  Past Surgical History:  Procedure Laterality Date  . NASAL SINUS SURGERY      Social History   Tobacco Use  . Smoking status: Never Smoker  . Smokeless tobacco: Never Used  . Tobacco comment: smoking cessation materials not required  Substance Use Topics  . Alcohol use: Yes    Alcohol/week: 0.0 standard drinks    Frequency: Never    Comment: occaisonal  . Drug use: No     Medication list has been reviewed and updated.  Current Meds  Medication Sig  . amLODipine (NORVASC) 10 MG tablet Take 1 tablet (10 mg total) by mouth daily.  Marland Kitchen glipiZIDE (GLUCOTROL XL) 2.5 MG 24 hr tablet Take 1 tablet (2.5 mg total) by mouth daily with breakfast.  . lisinopril-hydrochlorothiazide (PRINZIDE,ZESTORETIC) 20-12.5 MG tablet Take 1 tablet by mouth daily.  . metFORMIN (GLUCOPHAGE) 1000 MG tablet Take 1 tablet (1,000 mg total) by mouth 2 (two) times daily.  Marland Kitchen venlafaxine XR (EFFEXOR-XR) 150 MG 24 hr capsule TAKE 1 CAPSULE (150 MG TOTAL) BY MOUTH DAILY WITH BREAKFAST.    PHQ 2/9 Scores 05/12/2018 05/03/2018 03/31/2018 10/26/2017  PHQ - 2 Score 0 0 0 0  PHQ- 9 Score 2 1 1  0    Physical Exam Vitals signs and nursing note reviewed.  Constitutional:      General: He is not irritable. HENT:     Head: Normocephalic.     Right Ear: External ear normal.     Left Ear: External ear normal.     Nose: Nose normal.  Eyes:     General: No scleral icterus.       Right eye: No discharge.        Left eye: No  discharge.     Conjunctiva/sclera: Conjunctivae normal.     Pupils: Pupils are equal, round, and reactive to light.  Neck:     Musculoskeletal: Normal range of motion and neck supple.     Thyroid: No thyromegaly.     Vascular: No JVD.     Trachea: No tracheal deviation.  Cardiovascular:     Rate and Rhythm: Normal rate and regular rhythm.     Heart sounds: Normal heart sounds. No murmur. No friction rub. No gallop.   Pulmonary:     Effort: No respiratory distress.     Breath sounds: Normal breath sounds. No wheezing or rales.  Abdominal:     General: Bowel sounds are normal.     Palpations: Abdomen is soft. There is no mass.     Tenderness: There is no abdominal tenderness. There is no guarding or rebound.  Musculoskeletal: Normal range of motion.        General: No tenderness.     Right foot: Normal range of motion.     Left foot: Normal range of motion.  Feet:     Right foot:     Protective Sensation: 10 sites tested. 10 sites sensed.     Skin integrity: Dry skin present. No ulcer, blister, skin breakdown, erythema, warmth, callus or fissure.     Left foot:     Protective Sensation: 10 sites  tested. 10 sites sensed.     Skin integrity: Dry skin present. No ulcer, blister, skin breakdown, erythema, warmth, callus or fissure.  Lymphadenopathy:     Cervical: No cervical adenopathy.  Skin:    General: Skin is warm.     Findings: No rash.  Neurological:     Mental Status: He is alert and oriented to person, place, and time.     Cranial Nerves: No cranial nerve deficit.     Deep Tendon Reflexes: Reflexes are normal and symmetric.     BP 120/80   Pulse 60   Ht 5\' 11"  (1.803 m)   Wt 187 lb (84.8 kg)   BMI 26.08 kg/m   Assessment and Plan: 1. Type 2 diabetes mellitus without complication, without long-term current use of insulin (HCC) Chronic.  Controlled.  Continue metformin 1 g twice a day and glipizide 2.5 mg at breakfast.  Will recheck microalbuminuria and exam was  performed with no abnormalities. - Microalbumin, urine - metFORMIN (GLUCOPHAGE) 1000 MG tablet; Take 1 tablet (1,000 mg total) by mouth 2 (two) times daily.  Dispense: 180 tablet; Refill: 1 - glipiZIDE (GLUCOTROL XL) 2.5 MG 24 hr tablet; Take 1 tablet (2.5 mg total) by mouth daily with breakfast.  Dispense: 90 tablet; Refill: 1  2. Recurrent major depressive episodes (HCC) Chronic.  Controlled.  Patient does complain about some sedation however does not want a decrease in dosing at this time we will continue with venlafaxine 150 mg XR once a day. - venlafaxine XR (EFFEXOR-XR) 150 MG 24 hr capsule; Take 1 capsule (150 mg total) by mouth daily with breakfast.  Dispense: 90 capsule; Refill: 0  3. Essential (primary) hypertension Controlled.  Chronic.  Continue lisinopril hydrochlorothiazide 20-12 0.5 amlodipine 10 mg once a day and will recheck in 6 months. - lisinopril-hydrochlorothiazide (PRINZIDE,ZESTORETIC) 20-12.5 MG tablet; Take 1 tablet by mouth daily.  Dispense: 90 tablet; Refill: 1 - amLODipine (NORVASC) 10 MG tablet; Take 1 tablet (10 mg total) by mouth daily.  Dispense: 90 tablet; Refill: 1  4. Nocturia Has been having episodes of nocturia at least 2 times a night will check PSA.  ERG was performed and there was no abnormality. - PSA

## 2018-05-13 LAB — PSA: Prostate Specific Ag, Serum: 1.8 ng/mL (ref 0.0–4.0)

## 2018-05-13 LAB — MICROALBUMIN, URINE: Microalbumin, Urine: 809.9 ug/mL

## 2018-05-29 ENCOUNTER — Telehealth: Payer: Self-pay | Admitting: Gastroenterology

## 2018-05-29 NOTE — Telephone Encounter (Signed)
LVM for patient to let him know that his colonoscopy has been canceled with Dr. Allen Norris for 06/08/18 in [facility].   LVM with [facility] to inform them of patients cancellation.  Thanks [employer]

## 2018-05-29 NOTE — Telephone Encounter (Signed)
Pt left vm to r/s his procedure from 06/08/18 til sometime in April

## 2018-06-08 ENCOUNTER — Ambulatory Visit: Admit: 2018-06-08 | Payer: PPO | Admitting: Gastroenterology

## 2018-06-08 SURGERY — COLONOSCOPY WITH PROPOFOL
Anesthesia: General

## 2018-07-31 ENCOUNTER — Ambulatory Visit (INDEPENDENT_AMBULATORY_CARE_PROVIDER_SITE_OTHER): Payer: PPO | Admitting: Family Medicine

## 2018-07-31 ENCOUNTER — Other Ambulatory Visit: Payer: Self-pay

## 2018-07-31 ENCOUNTER — Encounter: Payer: Self-pay | Admitting: Family Medicine

## 2018-07-31 VITALS — BP 140/70 | HR 76 | Ht 71.0 in | Wt 178.0 lb

## 2018-07-31 DIAGNOSIS — E782 Mixed hyperlipidemia: Secondary | ICD-10-CM

## 2018-07-31 DIAGNOSIS — E119 Type 2 diabetes mellitus without complications: Secondary | ICD-10-CM | POA: Diagnosis not present

## 2018-07-31 DIAGNOSIS — F339 Major depressive disorder, recurrent, unspecified: Secondary | ICD-10-CM

## 2018-07-31 DIAGNOSIS — I10 Essential (primary) hypertension: Secondary | ICD-10-CM

## 2018-07-31 MED ORDER — VENLAFAXINE HCL ER 150 MG PO CP24: 150.0000 mg | ORAL_CAPSULE | Freq: Every day | ORAL | 0 refills | Status: DC

## 2018-07-31 MED ORDER — AMLODIPINE BESYLATE 10 MG PO TABS: 10.0000 mg | ORAL_TABLET | Freq: Every day | ORAL | 1 refills | Status: DC

## 2018-07-31 MED ORDER — METFORMIN HCL 1000 MG PO TABS: 1000.0000 mg | ORAL_TABLET | Freq: Two times a day (BID) | ORAL | 1 refills | Status: DC

## 2018-07-31 MED ORDER — GLIPIZIDE ER 2.5 MG PO TB24: 2.5000 mg | ORAL_TABLET | Freq: Every day | ORAL | 1 refills | Status: DC

## 2018-07-31 MED ORDER — LISINOPRIL-HYDROCHLOROTHIAZIDE 20-12.5 MG PO TABS: 1.0000 | ORAL_TABLET | Freq: Every day | ORAL | 1 refills | Status: DC

## 2018-07-31 NOTE — Progress Notes (Signed)
Date:  07/31/2018   Name:  Andrew [name]   DOB:  04/02/1950   MRN:  [id number]   Chief Complaint: Diabetes; Hypertension; and Depression  Diabetes  He presents for his follow-up diabetic visit. He has type 2 diabetes mellitus. His disease course has been stable. There are no hypoglycemic associated symptoms. Pertinent negatives for hypoglycemia include no dizziness, headaches, nervousness/anxiousness or sweats. Associated symptoms include weight loss. Pertinent negatives for diabetes include no blurred vision, no chest pain, no fatigue, no foot paresthesias, no foot ulcerations, no polydipsia, no polyphagia, no polyuria, no visual change and no weakness. There are no hypoglycemic complications. Symptoms are stable. There are no diabetic complications. Pertinent negatives for diabetic complications include no autonomic neuropathy, CVA, heart disease, impotence, nephropathy, peripheral neuropathy, PVD or retinopathy. There are no known risk factors for coronary artery disease. He is compliant with treatment most of the time. His weight is stable. He is following a generally healthy diet. Meal planning includes avoidance of concentrated sweets and carbohydrate counting. He participates in exercise daily. Home blood sugar record trend: does not monitor. An ACE inhibitor/angiotensin II receptor blocker is being taken. He does not see a podiatrist.Eye exam is current.  Hypertension  This is a chronic problem. The current episode started in the past 7 days. The problem is controlled. Pertinent negatives include no anxiety, blurred vision, chest pain, headaches, malaise/fatigue, neck pain, orthopnea, palpitations, peripheral edema, PND, shortness of breath or sweats. There are no associated agents to hypertension. There are no known risk factors for coronary artery disease. Past treatments include ACE inhibitors, diuretics and calcium channel blockers. The current treatment provides moderate improvement. There  are no compliance problems.  There is no history of angina, kidney disease, CAD/MI, CVA, heart failure, PVD or retinopathy. There is no history of chronic renal disease, a hypertension causing med or renovascular disease.  Depression         This is a chronic problem.  The current episode started more than 1 year ago.   The onset quality is gradual.   The problem has been gradually improving since onset.  Associated symptoms include no decreased concentration, no fatigue, no helplessness, no hopelessness, does not have insomnia, not irritable, no restlessness, no decreased interest, no appetite change, no body aches, no myalgias, no headaches, no indigestion, not sad and no suicidal ideas.     Exacerbated by: covid.  Past treatments include SNRIs - Serotonin and norepinephrine reuptake inhibitors.  Compliance with treatment is variable.  Previous treatment provided moderate relief.   Pertinent negatives include no hypothyroidism and no anxiety.Brain trauma: hyperlip.   Hyperlipidemia  This is a chronic problem. The current episode started more than 1 year ago. The problem is controlled. Recent lipid tests were reviewed and are normal. He has no history of chronic renal disease, diabetes, hypothyroidism, liver disease, obesity or nephrotic syndrome. Factors aggravating his hyperlipidemia include thiazides. Pertinent negatives include no chest pain, focal sensory loss, focal weakness, leg pain, myalgias or shortness of breath. Current antihyperlipidemic treatment includes diet change. The current treatment provides moderate improvement of lipids. There are no compliance problems.  Risk factors for coronary artery disease include dyslipidemia, diabetes mellitus, hypertension and post-menopausal.    Review of Systems  Constitutional: Positive for weight loss. Negative for appetite change, chills, fatigue, fever and malaise/fatigue.  HENT: Negative for drooling, ear discharge, ear pain and sore throat.   Eyes:  Negative for blurred vision.  Respiratory: Negative for cough, shortness of breath  and wheezing.   Cardiovascular: Negative for chest pain, palpitations, orthopnea, leg swelling and PND.  Gastrointestinal: Negative for abdominal pain, blood in stool, constipation, diarrhea and nausea.  Endocrine: Negative for polydipsia, polyphagia and polyuria.  Genitourinary: Negative for dysuria, frequency, hematuria, impotence and urgency.  Musculoskeletal: Negative for back pain, myalgias and neck pain.  Skin: Negative for rash.  Allergic/Immunologic: Negative for environmental allergies.  Neurological: Negative for dizziness, focal weakness, weakness and headaches.  Hematological: Does not bruise/bleed easily.  Psychiatric/Behavioral: Positive for depression. Negative for decreased concentration and suicidal ideas. The patient is not nervous/anxious and does not have insomnia.     Patient Active Problem List   Diagnosis Date Noted  . Mixed hyperlipidemia 10/05/2016  . Familial multiple lipoprotein-type hyperlipidemia 08/02/2014  . Routine general medical examination at a health care facility 08/02/2014  . Recurrent major depressive episodes ([place]) 08/02/2014  . Essential (primary) hypertension 08/02/2014  . Overweight 08/02/2014  . Diabetes mellitus, type 2 ([place]) 08/02/2014    No Known Allergies  Past Surgical History:  Procedure Laterality Date  . NASAL SINUS SURGERY      Social History   Tobacco Use  . Smoking status: Never Smoker  . Smokeless tobacco: Never Used  . Tobacco comment: smoking cessation materials not required  Substance Use Topics  . Alcohol use: Yes    Alcohol/week: 0.0 standard drinks    Frequency: Never    Comment: occaisonal  . Drug use: No     Medication list has been reviewed and updated.  Current Meds  Medication Sig  . amLODipine (NORVASC) 10 MG tablet Take 1 tablet (10 mg total) by mouth daily.  Marland Kitchen glipiZIDE (GLUCOTROL XL) 2.5 MG 24 hr tablet Take 1  tablet (2.5 mg total) by mouth daily with breakfast.  . lisinopril-hydrochlorothiazide (PRINZIDE,ZESTORETIC) 20-12.5 MG tablet Take 1 tablet by mouth daily.  . metFORMIN (GLUCOPHAGE) 1000 MG tablet Take 1 tablet (1,000 mg total) by mouth 2 (two) times daily.  Marland Kitchen venlafaxine XR (EFFEXOR-XR) 150 MG 24 hr capsule Take 1 capsule (150 mg total) by mouth daily with breakfast.    PHQ 2/9 Scores 07/31/2018 05/12/2018 05/03/2018 03/31/2018  PHQ - 2 Score 0 0 0 0  PHQ- 9 Score 0 2 1 1     BP Readings from Last 3 Encounters:  07/31/18 140/70  05/12/18 120/80  05/03/18 122/82    Physical Exam Vitals signs and nursing note reviewed.  Constitutional:      General: He is not irritable. HENT:     Head: Normocephalic.     Right Ear: Tympanic membrane, ear canal and external ear normal.     Left Ear: Tympanic membrane, ear canal and external ear normal.     Nose: Nose normal. No congestion or rhinorrhea.     Mouth/Throat:     Mouth: Mucous membranes are moist.  Eyes:     General: No scleral icterus.       Right eye: No discharge.        Left eye: No discharge.     Conjunctiva/sclera: Conjunctivae normal.     Pupils: Pupils are equal, round, and reactive to light.  Neck:     Musculoskeletal: Normal range of motion and neck supple.     Thyroid: No thyromegaly.     Vascular: No JVD.     Trachea: No tracheal deviation.  Cardiovascular:     Rate and Rhythm: Normal rate and regular rhythm.     Heart sounds: Normal heart sounds. No murmur. No friction  rub. No gallop.   Pulmonary:     Effort: No respiratory distress.     Breath sounds: Normal breath sounds. No wheezing or rales.  Abdominal:     General: Bowel sounds are normal.     Palpations: Abdomen is soft. There is no mass.     Tenderness: There is no abdominal tenderness. There is no guarding or rebound.  Musculoskeletal: Normal range of motion.        General: No tenderness.  Lymphadenopathy:     Cervical: No cervical adenopathy.  Skin:     General: Skin is warm.     Findings: No rash.  Neurological:     Mental Status: He is alert and oriented to person, place, and time.     Cranial Nerves: No cranial nerve deficit.     Deep Tendon Reflexes: Reflexes are normal and symmetric.     Wt Readings from Last 3 Encounters:  07/31/18 178 lb (80.7 kg)  05/12/18 187 lb (84.8 kg)  05/03/18 185 lb 12.8 oz (84.3 kg)    BP 140/70   Pulse 76   Ht 5\' 11"  (1.803 m)   Wt 178 lb (80.7 kg)   BMI 24.83 kg/m   Assessment and Plan: 1. Essential (primary) hypertension Chronic.  Controlled.  Continue amlodipine 10 mg once a day and lisinopril hydrochlorothiazide 20-12 0.5 once a day.  Will check renal function panel. - amLODipine (NORVASC) 10 MG tablet; Take 1 tablet (10 mg total) by mouth daily.  Dispense: 90 tablet; Refill: 1 - lisinopril-hydrochlorothiazide (ZESTORETIC) 20-12.5 MG tablet; Take 1 tablet by mouth daily.  Dispense: 90 tablet; Refill: 1 - Renal Function Panel  2. Recurrent major depressive episodes (HCC) Depression is controlled on venlafaxine 150 mg XL once a day.  PHQ 9 currently is 0. - venlafaxine XR (EFFEXOR-XR) 150 MG 24 hr capsule; Take 1 capsule (150 mg total) by mouth daily with breakfast.  Dispense: 90 capsule; Refill: 0  3. Type 2 diabetes mellitus without complication, without long-term current use of insulin (HCC) Chronic.  Controlled.  Continue glipizide 2.5 once a day and metformin 1 g twice a day.  Will check A1c and renal function panel. - glipiZIDE (GLUCOTROL XL) 2.5 MG 24 hr tablet; Take 1 tablet (2.5 mg total) by mouth daily with breakfast.  Dispense: 90 tablet; Refill: 1 - metFORMIN (GLUCOPHAGE) 1000 MG tablet; Take 1 tablet (1,000 mg total) by mouth 2 (two) times daily.  Dispense: 180 tablet; Refill: 1 - Hemoglobin A1c - Renal Function Panel  4. Mixed hyperlipidemia Patient with history of mixed hyperlipidemia has been elevated in the past however patient was unable to take statin due to cramps.   Will check lipid panel.  If LDL is elevated will consider starting Zetia. - Lipid panel

## 2018-08-01 ENCOUNTER — Other Ambulatory Visit: Payer: Self-pay

## 2018-08-01 DIAGNOSIS — E119 Type 2 diabetes mellitus without complications: Secondary | ICD-10-CM

## 2018-08-01 LAB — RENAL FUNCTION PANEL
Albumin: 4.5 g/dL (ref 3.8–4.8)
BUN/Creatinine Ratio: 23 (ref 10–24)
BUN: 23 mg/dL (ref 8–27)
CO2: 24 mmol/L (ref 20–29)
Calcium: 9.7 mg/dL (ref 8.6–10.2)
Chloride: 101 mmol/L (ref 96–106)
Creatinine, Ser: 0.99 mg/dL (ref 0.76–1.27)
GFR calc Af Amer: 91 mL/min/{1.73_m2} (ref >59)
GFR calc non Af Amer: 78 mL/min/{1.73_m2} (ref >59)
Glucose: 105 mg/dL — ABNORMAL HIGH (ref 65–99)
Phosphorus: 3.6 mg/dL (ref 2.8–4.1)
Potassium: 4.3 mmol/L (ref 3.5–5.2)
Sodium: 139 mmol/L (ref 134–144)

## 2018-08-01 LAB — LIPID PANEL
Chol/HDL Ratio: 3.3 ratio (ref 0.0–5.0)
Cholesterol, Total: 170 mg/dL (ref 100–199)
HDL: 51 mg/dL (ref >39)
LDL Calculated: 101 mg/dL — ABNORMAL HIGH (ref 0–99)
Triglycerides: 91 mg/dL (ref 0–149)
VLDL Cholesterol Cal: 18 mg/dL (ref 5–40)

## 2018-08-01 LAB — HEMOGLOBIN A1C
Est. average glucose Bld gHb Est-mCnc: 111 mg/dL
Hgb A1c MFr Bld: 5.5 % (ref 4.8–5.6)

## 2018-08-01 MED ORDER — GLUCOSE BLOOD VI STRP: ORAL_STRIP | 12 refills | Status: DC

## 2018-08-01 NOTE — Progress Notes (Unsigned)
Sent in strips and lancets

## 2018-12-21 ENCOUNTER — Other Ambulatory Visit: Payer: Self-pay

## 2018-12-21 ENCOUNTER — Encounter: Payer: Self-pay | Admitting: Emergency Medicine

## 2018-12-21 DIAGNOSIS — R079 Chest pain, unspecified: Secondary | ICD-10-CM | POA: Diagnosis not present

## 2018-12-21 DIAGNOSIS — Z20828 Contact with and (suspected) exposure to other viral communicable diseases: Secondary | ICD-10-CM | POA: Insufficient documentation

## 2018-12-21 DIAGNOSIS — I722 Aneurysm of renal artery: Secondary | ICD-10-CM | POA: Diagnosis not present

## 2018-12-21 DIAGNOSIS — E119 Type 2 diabetes mellitus without complications: Secondary | ICD-10-CM | POA: Insufficient documentation

## 2018-12-21 DIAGNOSIS — I7101 Dissection of thoracic aorta: Secondary | ICD-10-CM | POA: Insufficient documentation

## 2018-12-21 DIAGNOSIS — Z7984 Long term (current) use of oral hypoglycemic drugs: Secondary | ICD-10-CM | POA: Diagnosis not present

## 2018-12-21 DIAGNOSIS — I10 Essential (primary) hypertension: Secondary | ICD-10-CM | POA: Diagnosis not present

## 2018-12-21 DIAGNOSIS — Z79899 Other long term (current) drug therapy: Secondary | ICD-10-CM | POA: Diagnosis not present

## 2018-12-21 DIAGNOSIS — M542 Cervicalgia: Secondary | ICD-10-CM | POA: Diagnosis not present

## 2018-12-21 DIAGNOSIS — R42 Dizziness and giddiness: Secondary | ICD-10-CM | POA: Diagnosis present

## 2018-12-21 NOTE — ED Triage Notes (Addendum)
Pt in via POV, reports nausea and light headededness, states, "seeing stars" around 1830 tonight, then developing a left side neck pain, states, "It feels like a pressure."  Ambulatory to triage, no neuro deficits at this time, denies any recent injury.  Vitals WDL.

## 2018-12-22 ENCOUNTER — Emergency Department
Admission: EM | Admit: 2018-12-22 | Discharge: 2018-12-22 | Disposition: A | Discharge status: Other Acute Inpatient Hospital | Payer: PPO | Attending: Emergency Medicine | Admitting: Emergency Medicine

## 2018-12-22 ENCOUNTER — Encounter: Payer: Self-pay | Admitting: Radiology

## 2018-12-22 ENCOUNTER — Emergency Department: Payer: PPO

## 2018-12-22 DIAGNOSIS — R0902 Hypoxemia: Secondary | ICD-10-CM | POA: Diagnosis not present

## 2018-12-22 DIAGNOSIS — Z743 Need for continuous supervision: Secondary | ICD-10-CM | POA: Diagnosis not present

## 2018-12-22 DIAGNOSIS — D72829 Elevated white blood cell count, unspecified: Secondary | ICD-10-CM | POA: Diagnosis not present

## 2018-12-22 DIAGNOSIS — R079 Chest pain, unspecified: Secondary | ICD-10-CM | POA: Diagnosis not present

## 2018-12-22 DIAGNOSIS — Z452 Encounter for adjustment and management of vascular access device: Secondary | ICD-10-CM | POA: Diagnosis not present

## 2018-12-22 DIAGNOSIS — R0689 Other abnormalities of breathing: Secondary | ICD-10-CM | POA: Diagnosis not present

## 2018-12-22 DIAGNOSIS — I10 Essential (primary) hypertension: Secondary | ICD-10-CM | POA: Diagnosis not present

## 2018-12-22 DIAGNOSIS — I7101 Dissection of thoracic aorta: Secondary | ICD-10-CM

## 2018-12-22 DIAGNOSIS — Z9911 Dependence on respirator [ventilator] status: Secondary | ICD-10-CM | POA: Diagnosis not present

## 2018-12-22 DIAGNOSIS — J90 Pleural effusion, not elsewhere classified: Secondary | ICD-10-CM | POA: Diagnosis not present

## 2018-12-22 DIAGNOSIS — Z4682 Encounter for fitting and adjustment of non-vascular catheter: Secondary | ICD-10-CM | POA: Diagnosis not present

## 2018-12-22 DIAGNOSIS — I340 Nonrheumatic mitral (valve) insufficiency: Secondary | ICD-10-CM | POA: Diagnosis not present

## 2018-12-22 DIAGNOSIS — R279 Unspecified lack of coordination: Secondary | ICD-10-CM | POA: Diagnosis not present

## 2018-12-22 DIAGNOSIS — I7100 Dissection of unspecified site of aorta: Secondary | ICD-10-CM | POA: Diagnosis not present

## 2018-12-22 DIAGNOSIS — M542 Cervicalgia: Secondary | ICD-10-CM | POA: Diagnosis not present

## 2018-12-22 DIAGNOSIS — E1165 Type 2 diabetes mellitus with hyperglycemia: Secondary | ICD-10-CM | POA: Diagnosis not present

## 2018-12-22 DIAGNOSIS — I722 Aneurysm of renal artery: Secondary | ICD-10-CM | POA: Diagnosis not present

## 2018-12-22 DIAGNOSIS — D62 Acute posthemorrhagic anemia: Secondary | ICD-10-CM | POA: Diagnosis not present

## 2018-12-22 DIAGNOSIS — R633 Feeding difficulties: Secondary | ICD-10-CM | POA: Diagnosis not present

## 2018-12-22 DIAGNOSIS — I4949 Other premature depolarization: Secondary | ICD-10-CM | POA: Diagnosis not present

## 2018-12-22 DIAGNOSIS — J986 Disorders of diaphragm: Secondary | ICD-10-CM | POA: Diagnosis not present

## 2018-12-22 DIAGNOSIS — J9811 Atelectasis: Secondary | ICD-10-CM | POA: Diagnosis not present

## 2018-12-22 DIAGNOSIS — Z7984 Long term (current) use of oral hypoglycemic drugs: Secondary | ICD-10-CM | POA: Diagnosis not present

## 2018-12-22 DIAGNOSIS — I71019 Dissection of thoracic aorta, unspecified: Secondary | ICD-10-CM

## 2018-12-22 DIAGNOSIS — I712 Thoracic aortic aneurysm, without rupture: Secondary | ICD-10-CM | POA: Diagnosis not present

## 2018-12-22 DIAGNOSIS — J939 Pneumothorax, unspecified: Secondary | ICD-10-CM | POA: Diagnosis not present

## 2018-12-22 DIAGNOSIS — R5381 Other malaise: Secondary | ICD-10-CM | POA: Diagnosis not present

## 2018-12-22 DIAGNOSIS — J951 Acute pulmonary insufficiency following thoracic surgery: Secondary | ICD-10-CM | POA: Diagnosis not present

## 2018-12-22 DIAGNOSIS — G8918 Other acute postprocedural pain: Secondary | ICD-10-CM | POA: Diagnosis not present

## 2018-12-22 DIAGNOSIS — R2689 Other abnormalities of gait and mobility: Secondary | ICD-10-CM | POA: Diagnosis not present

## 2018-12-22 LAB — TROPONIN I (HIGH SENSITIVITY): Troponin I (High Sensitivity): 5 ng/L (ref <18)

## 2018-12-22 LAB — BASIC METABOLIC PANEL
Anion gap: 13 (ref 5–15)
BUN: 30 mg/dL — ABNORMAL HIGH (ref 8–23)
CO2: 26 mmol/L (ref 22–32)
Calcium: 9.3 mg/dL (ref 8.9–10.3)
Chloride: 106 mmol/L (ref 98–111)
Creatinine, Ser: 1.14 mg/dL (ref 0.61–1.24)
GFR calc Af Amer: >60 mL/min (ref >60)
GFR calc non Af Amer: >60 mL/min (ref >60)
Glucose, Bld: 118 mg/dL — ABNORMAL HIGH (ref 70–99)
Potassium: 4.4 mmol/L (ref 3.5–5.1)
Sodium: 145 mmol/L (ref 135–145)

## 2018-12-22 LAB — CBC
HCT: 43.6 % (ref 39.0–52.0)
Hemoglobin: 14.9 g/dL (ref 13.0–17.0)
MCH: 33 pg (ref 26.0–34.0)
MCHC: 34.2 g/dL (ref 30.0–36.0)
MCV: 96.7 fL (ref 80.0–100.0)
Platelets: 293 10*3/uL (ref 150–400)
RBC: 4.51 MIL/uL (ref 4.22–5.81)
RDW: 13.2 % (ref 11.5–15.5)
WBC: 10.8 10*3/uL — ABNORMAL HIGH (ref 4.0–10.5)
nRBC: 0 % (ref 0.0–0.2)

## 2018-12-22 LAB — SARS CORONAVIRUS 2 BY RT PCR (HOSPITAL ORDER, PERFORMED IN ~~LOC~~ HOSPITAL LAB): SARS Coronavirus 2: NEGATIVE

## 2018-12-22 MED ORDER — IOHEXOL 350 MG/ML SOLN
100.0000 mL | Freq: Once | INTRAVENOUS | Status: AC | PRN
  Administered 2018-12-22: 100 mL via INTRAVENOUS

## 2018-12-22 MED ORDER — IOHEXOL 350 MG/ML SOLN
75.0000 mL | Freq: Once | INTRAVENOUS | Status: AC | PRN
  Administered 2018-12-22: 75 mL via INTRAVENOUS

## 2018-12-22 NOTE — ED Notes (Signed)
Pt presentation discussed with EDP, Owens Shark; see new orders.

## 2018-12-22 NOTE — ED Notes (Signed)
[facility] and Medical Necessity documentation reviewed at this time and found to be complete per policy. 

## 2018-12-22 NOTE — ED Provider Notes (Signed)
[facility] Emergency Department Provider Note _______________   First MD Initiated Contact with Patient 12/22/18 [phone]     (approximate)  I have reviewed the triage vital signs and the nursing notes.   HISTORY  Chief Complaint Dizziness and Neck Pain   HPI Andrew [name] is a 69 y.o. male with below list of previous medical conditions including hypertension diabetes hyperlipidemia presents to the emergency department secondary to acute onset of left sided chest pain with radiation to the left anterior neck which began at 6:30 PM tonight.  Patient describes it as a tight sensation.  Patient states that upon going to bed tonight he had difficulty breathing as well.  Patient denies any abdominal discomfort.  Patient denies any lower extremity pain or weakness.        Past Medical History:  Diagnosis Date   Depression    Diabetes mellitus without complication ([place])    Hyperlipidemia    Hypertension     Patient Active Problem List   Diagnosis Date Noted   Mixed hyperlipidemia 10/05/2016   Familial multiple lipoprotein-type hyperlipidemia 08/02/2014   Routine general medical examination at a health care facility 08/02/2014   Recurrent major depressive episodes ([place]) 08/02/2014   Essential (primary) hypertension 08/02/2014   Overweight 08/02/2014   Diabetes mellitus, type 2 ([place]) 08/02/2014    Past Surgical History:  Procedure Laterality Date   NASAL SINUS SURGERY      Prior to Admission medications   Medication Sig Start Date End Date Taking? Authorizing Provider  amLODipine (NORVASC) 10 MG tablet Take 1 tablet (10 mg total) by mouth daily. 07/31/18   Juline Patch, MD  glipiZIDE (GLUCOTROL XL) 2.5 MG 24 hr tablet Take 1 tablet (2.5 mg total) by mouth daily with breakfast. 07/31/18   [name] C, MD  glucose blood test strip Use as instructed 08/01/18   Juline Patch, MD  lisinopril-hydrochlorothiazide (ZESTORETIC) 20-12.5 MG  tablet Take 1 tablet by mouth daily. 07/31/18   Juline Patch, MD  metFORMIN (GLUCOPHAGE) 1000 MG tablet Take 1 tablet (1,000 mg total) by mouth 2 (two) times daily. 07/31/18   Juline Patch, MD  venlafaxine XR (EFFEXOR-XR) 150 MG 24 hr capsule Take 1 capsule (150 mg total) by mouth daily with breakfast. 07/31/18   Juline Patch, MD    Allergies Patient has no known allergies.  Family History  Problem Relation Age of Onset   Heart disease Mother    Healthy Father     Social History Social History   Tobacco Use   Smoking status: Never Smoker   Smokeless tobacco: Never Used   Tobacco comment: smoking cessation materials not required  Substance Use Topics   Alcohol use: Yes    Alcohol/week: 0.0 standard drinks    Frequency: Never   Drug use: No    Review of Systems Constitutional: No fever/chills Eyes: No visual changes. ENT: No sore throat. Cardiovascular: Positive for chest pain. Respiratory: Denies shortness of breath. Gastrointestinal: No abdominal pain.  No nausea, no vomiting.  No diarrhea.  No constipation. Genitourinary: Negative for dysuria. Musculoskeletal: Positive for neck pain.  Negative for back pain. Integumentary: Negative for rash. Neurological: Negative for headaches, focal weakness or numbness.  ____________________________________________   PHYSICAL EXAM:  VITAL SIGNS: ED Triage Vitals  Enc Vitals Group     BP 12/21/18 2355 132/70     Pulse Rate 12/21/18 2355 (!) 57     Resp 12/21/18 2355 16  Temp 12/21/18 2355 98.2 F (36.8 C)     Temp Source 12/21/18 2355 Oral     SpO2 12/21/18 2355 97 %     Weight 12/21/18 2356 81.6 kg (180 lb)     Height 12/21/18 2356 1.854 m (6\' 1" )     Head Circumference --      Peak Flow --      Pain Score 12/21/18 2356 4     Pain Loc --      Pain Edu? --      Excl. in [place]? --     Constitutional: Alert and oriented.  Eyes: Conjunctivae are normal.  Mouth/Throat: Mucous membranes are moist. Neck: No  stridor.  No meningeal signs.   Cardiovascular: Normal rate, regular rhythm. Good peripheral circulation. Grossly normal heart sounds. Respiratory: Normal respiratory effort.  No retractions. Gastrointestinal: Soft and nontender. No distention.  Musculoskeletal: No lower extremity tenderness nor edema. No gross deformities of extremities. Neurologic:  Normal speech and language. No gross focal neurologic deficits are appreciated.  Skin:  Skin is warm, dry and intact. Psychiatric: Mood and affect are normal. Speech and behavior are normal.  ____________________________________________   LABS (all labs ordered are listed, but only abnormal results are displayed)  Labs Reviewed  BASIC METABOLIC PANEL - Abnormal; Notable for the following components:      Result Value   Glucose, Bld 118 (*)    BUN 30 (*)    All other components within normal limits  CBC - Abnormal; Notable for the following components:   WBC 10.8 (*)    All other components within normal limits  SARS CORONAVIRUS 2 (HOSPITAL ORDER, [place] [facility])  URINALYSIS, COMPLETE (UACMP) WITH MICROSCOPIC  CBG MONITORING, ED  TROPONIN I (HIGH SENSITIVITY)  TROPONIN I (HIGH SENSITIVITY)   ____________________________________________  EKG  ED ECG REPORT I, [place] Letita Prentiss, the attending physician, personally viewed and interpreted this ECG.   Date: 12/21/2018  EKG Time: 11:58 PM  Rate: 57  Rhythm: Normal sinus rhythm  Axis: Normal  Intervals: Normal  ST&T Change: None  ____________________________________________  RADIOLOGY I, [place] Symphony Demuro, personally viewed and evaluated these images (plain radiographs) as part of my medical decision making, as well as reviewing the written report by the radiologist.  ED MD interpretation: Type aortic dissection  Official radiology report(s): Ct Angio Chest Aorta W And/or Wo Contrast  Result Date: 12/22/2018 CLINICAL DATA:  68 year old male with chest  pain. EXAM: CT ANGIOGRAPHY CHEST, ABDOMEN AND PELVIS TECHNIQUE: Multidetector CT imaging through the chest, abdomen and pelvis was performed using the standard protocol during bolus administration of intravenous contrast. Multiplanar reconstructed images and MIPs were obtained and reviewed to evaluate the vascular anatomy. CONTRAST:  69mL OMNIPAQUE IOHEXOL 350 MG/ML SOLN, 172mL OMNIPAQUE IOHEXOL 350 MG/ML SOLN COMPARISON:  Chest CT dated 04/22/2008 FINDINGS: CTA CHEST FINDINGS Cardiovascular: There is no cardiomegaly or pericardial effusion. There is coronary vascular calcification primarily involving the LAD and with lesser involvement of the RCA. There is dilatation of the ascending aorta measuring up to approximately 4 5 cm. There is dissection of the ascending aorta which appears new since the prior CT. This is of indeterminate chronicity. Clinical correlation is recommended. No periaortic hematoma or inflammatory changes. No extravasation of contrast noted outside of the confines of the aortic wall. There is a 2.3 cm penetrating ulcer of the medial wall of the aortic isthmus. There is mild atherosclerotic calcification of the thoracic aorta. The origins of the great vessels  of the aortic arch appear patent. The central pulmonary arteries are unremarkable as visualized. Mediastinum/Nodes: There is no hilar or mediastinal adenopathy. The esophagus and the thyroid gland are grossly unremarkable. No mediastinal fluid collection. Lungs/Pleura: There is mild eventration of the right hemidiaphragm with right lung base subsegmental atelectasis. Pneumonia is not excluded. The left lung is clear. There is no pleural effusion pneumothorax. The central airways are patent. Musculoskeletal: No chest wall abnormality. No acute or significant osseous findings. Review of the MIP images confirms the above findings. CTA ABDOMEN AND PELVIS FINDINGS VASCULAR Aorta: Moderate atherosclerotic plaque. There is irregularity of the  aortic wall. Aneurysmal dilatation of the abdominal aorta at the level of the renal arteries measuring up to 3.3 cm. Ectatic infrarenal aorta measures up to 2.6 cm. No dissection. Celiac: Patent without evidence of aneurysm, dissection, vasculitis or significant stenosis. SMA: Patent without evidence of aneurysm, dissection, vasculitis or significant stenosis. Renals: The renal arteries are patent. 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 IMA: Patent without evidence of aneurysm, dissection, vasculitis or significant stenosis. Inflow: Patent without evidence of aneurysm, dissection, vasculitis or significant stenosis. Veins: No obvious venous abnormality within the limitations of this arterial phase study. Review of the MIP images confirms the above findings. NON-VASCULAR No intra-abdominal free air. Diffuse upper abdominal inflammatory changes and edema. Hepatobiliary: There is fatty infiltration of the liver. No intrahepatic biliary ductal dilatation. No calcified gallstone. Pancreas: There is inflammatory changes of the head and uncinate process of the pancreas as well as diffuse inflammatory changes of the upper mesentery most consistent with acute pancreatitis. Correlation with pancreatic enzymes recommended. No drainable fluid collection/abscess. Spleen: Normal in size without focal abnormality. Adrenals/Urinary Tract: The adrenal glands are unremarkable. There is no hydronephrosis on either side. There is symmetric enhancement and excretion of contrast by both kidneys. A 1 cm left renal hypodense lesion is not well characterized but likely represents a cyst. There is trabeculated appearance of the urinary bladder, likely related to chronic bladder outlet obstruction. Stomach/Bowel: There is extensive sigmoid diverticulosis and scattered colonic diverticula without active inflammatory changes. There is no bowel obstruction. The appendix is normal. There is inflammatory changes of the duodenal C-loop  likely secondary to inflammatory changes of the pancreas. Duodenal gist as the primary cause of the inflammation of the upper abdomen is favored less likely. Lymphatic: Top-normal peripancreatic lymph nodes, reactive. Reproductive: Enlarged prostate gland. The seminal vesicles are symmetric. Other: None Musculoskeletal: Degenerative changes of the spine. No acute osseous pathology. Review of the MIP images confirms the above findings. IMPRESSION: 1. Type A aortic dissection. Thoracic or vascular surgery consult is advised. 2. 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 There is ectasias of the infrarenal abdominal aorta. 3. Inflammatory changes of the head and uncinate process of pancreas as well as duodenal C-loop. Findings likely represent pancreatitis and less likely duodenitis. Correlation with pancreatic enzymes recommended. No drainable fluid collection/abscess. 4. A 2.3 cm penetrating ulcer of the medial wall of the aortic isthmus. 5. Extensive colonic diverticulosis. No bowel obstruction. Normal appendix. These results were called by telephone at the time of interpretation on 12/22/2018 at 2:26 am to provider [facility] , who verbally acknowledged these results. Electronically Signed   By: Anner Crete M.D.   On: 12/22/2018 02:35   Ct Angio Abd/pel W And/or Wo Contrast  Result Date: 12/22/2018 CLINICAL DATA:  68 year old male with chest pain. EXAM: CT ANGIOGRAPHY CHEST,  ABDOMEN AND PELVIS TECHNIQUE: Multidetector CT imaging through the chest, abdomen and pelvis was performed using the standard protocol during bolus administration of intravenous contrast. Multiplanar reconstructed images and MIPs were obtained and reviewed to evaluate the vascular anatomy. CONTRAST:  45mL OMNIPAQUE IOHEXOL 350 MG/ML SOLN, 157mL OMNIPAQUE IOHEXOL 350 MG/ML SOLN COMPARISON:  Chest CT dated 04/22/2008 FINDINGS: CTA CHEST FINDINGS Cardiovascular: There  is no cardiomegaly or pericardial effusion. There is coronary vascular calcification primarily involving the LAD and with lesser involvement of the RCA. There is dilatation of the ascending aorta measuring up to approximately 4 5 cm. There is dissection of the ascending aorta which appears new since the prior CT. This is of indeterminate chronicity. Clinical correlation is recommended. No periaortic hematoma or inflammatory changes. No extravasation of contrast noted outside of the confines of the aortic wall. There is a 2.3 cm penetrating ulcer of the medial wall of the aortic isthmus. There is mild atherosclerotic calcification of the thoracic aorta. The origins of the great vessels of the aortic arch appear patent. The central pulmonary arteries are unremarkable as visualized. Mediastinum/Nodes: There is no hilar or mediastinal adenopathy. The esophagus and the thyroid gland are grossly unremarkable. No mediastinal fluid collection. Lungs/Pleura: There is mild eventration of the right hemidiaphragm with right lung base subsegmental atelectasis. Pneumonia is not excluded. The left lung is clear. There is no pleural effusion pneumothorax. The central airways are patent. Musculoskeletal: No chest wall abnormality. No acute or significant osseous findings. Review of the MIP images confirms the above findings. CTA ABDOMEN AND PELVIS FINDINGS VASCULAR Aorta: Moderate atherosclerotic plaque. There is irregularity of the aortic wall. Aneurysmal dilatation of the abdominal aorta at the level of the renal arteries measuring up to 3.3 cm. Ectatic infrarenal aorta measures up to 2.6 cm. No dissection. Celiac: Patent without evidence of aneurysm, dissection, vasculitis or significant stenosis. SMA: Patent without evidence of aneurysm, dissection, vasculitis or significant stenosis. Renals: The renal arteries are patent. 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 IMA: Patent without evidence of aneurysm, dissection,  vasculitis or significant stenosis. Inflow: Patent without evidence of aneurysm, dissection, vasculitis or significant stenosis. Veins: No obvious venous abnormality within the limitations of this arterial phase study. Review of the MIP images confirms the above findings. NON-VASCULAR No intra-abdominal free air. Diffuse upper abdominal inflammatory changes and edema. Hepatobiliary: There is fatty infiltration of the liver. No intrahepatic biliary ductal dilatation. No calcified gallstone. Pancreas: There is inflammatory changes of the head and uncinate process of the pancreas as well as diffuse inflammatory changes of the upper mesentery most consistent with acute pancreatitis. Correlation with pancreatic enzymes recommended. No drainable fluid collection/abscess. Spleen: Normal in size without focal abnormality. Adrenals/Urinary Tract: The adrenal glands are unremarkable. There is no hydronephrosis on either side. There is symmetric enhancement and excretion of contrast by both kidneys. A 1 cm left renal hypodense lesion is not well characterized but likely represents a cyst. There is trabeculated appearance of the urinary bladder, likely related to chronic bladder outlet obstruction. Stomach/Bowel: There is extensive sigmoid diverticulosis and scattered colonic diverticula without active inflammatory changes. There is no bowel obstruction. The appendix is normal. There is inflammatory changes of the duodenal C-loop likely secondary to inflammatory changes of the pancreas. Duodenal gist as the primary cause of the inflammation of the upper abdomen is favored less likely. Lymphatic: Top-normal peripancreatic lymph nodes, reactive. Reproductive: Enlarged prostate gland. The seminal vesicles are symmetric. Other: None Musculoskeletal: Degenerative changes of the spine. No acute  osseous pathology. Review of the MIP images confirms the above findings. IMPRESSION: 1. Type A aortic dissection. Thoracic or vascular  surgery consult is advised. 2. 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 There is ectasias of the infrarenal abdominal aorta. 3. Inflammatory changes of the head and uncinate process of pancreas as well as duodenal C-loop. Findings likely represent pancreatitis and less likely duodenitis. Correlation with pancreatic enzymes recommended. No drainable fluid collection/abscess. 4. A 2.3 cm penetrating ulcer of the medial wall of the aortic isthmus. 5. Extensive colonic diverticulosis. No bowel obstruction. Normal appendix. These results were called by telephone at the time of interpretation on 12/22/2018 at 2:26 am to provider [facility] , who verbally acknowledged these results. Electronically Signed   By: Anner Crete M.D.   On: 12/22/2018 02:35    ____________________________________________   PROCEDURES   .Critical Care Performed by: Gregor Hams, MD Authorized by: Gregor Hams, MD   Critical care provider statement:    Critical care time (minutes):  60   Critical care time was exclusive of:  Separately billable procedures and treating other patients (Type A Dissection)   Critical care was necessary to treat or prevent imminent or life-threatening deterioration of the following conditions:  Circulatory failure   Critical care was time spent personally by me on the following activities:  Development of treatment plan with patient or surrogate, discussions with consultants, evaluation of patient's response to treatment, examination of patient, obtaining history from patient or surrogate, ordering and performing treatments and interventions, ordering and review of laboratory studies, ordering and review of radiographic studies, pulse oximetry, re-evaluation of patient's condition and review of old charts     ____________________________________________   INITIAL IMPRESSION / MDM / [place] / ED  COURSE  As part of my medical decision making, I reviewed the following data within the electronic MEDICAL RECORD NUMBER   69 year old male presenting with above-stated history and physical exam concerning for possible aortic dissection and as such patient was taken immediately to CAT scan where CT angiogram was performed which revealed a type a aortic dissection.  Patient's blood pressure and heart rate currently 132/70 with a heart rate of 57.  Patient discussed with Dr. Danise Mina CT surgery at [facility] who accepted the patient in transfer.  I spoke with the patient and his wife at length and informed him of all clinical findings. ____________________________________________  FINAL CLINICAL IMPRESSION(S) / ED DIAGNOSES  Final diagnoses:  Thoracic aortic dissection ([place])     MEDICATIONS GIVEN DURING THIS VISIT:  Medications  iohexol (OMNIPAQUE) 350 MG/ML injection 75 mL (75 mLs Intravenous Contrast Given 12/22/18 0100)  iohexol (OMNIPAQUE) 350 MG/ML injection 100 mL (100 mLs Intravenous Contrast Given 12/22/18 0123)     ED Discharge Orders    None      *Please note:  LAWERNCE DU was evaluated in Emergency Department on 12/22/2018 for the symptoms described in the history of present illness. He was evaluated in the context of the global COVID-19 pandemic, which necessitated consideration that the patient might be at risk for infection with the SARS-CoV-2 virus that causes COVID-19. Institutional protocols and algorithms that pertain to the evaluation of patients at risk for COVID-19 are in a state of rapid change based on information released by regulatory bodies including the CDC and federal and state organizations. These policies and algorithms were followed during the patient's care in the ED.  Some  ED evaluations and interventions may be delayed as a result of limited staffing during the pandemic.*  Note:  This document was prepared using Dragon voice recognition software and may include  unintentional dictation errors.   Gregor Hams, MD 12/22/18 [phone]

## 2018-12-22 NOTE — ED Notes (Signed)
Patient transported to CT 

## 2018-12-22 NOTE — ED Notes (Signed)
Dr. Owens Shark on phone with pt wife updating on pt transfer.

## 2018-12-23 DIAGNOSIS — Z4682 Encounter for fitting and adjustment of non-vascular catheter: Secondary | ICD-10-CM | POA: Diagnosis not present

## 2018-12-23 DIAGNOSIS — I10 Essential (primary) hypertension: Secondary | ICD-10-CM | POA: Diagnosis not present

## 2018-12-23 DIAGNOSIS — I7101 Dissection of thoracic aorta: Secondary | ICD-10-CM | POA: Insufficient documentation

## 2018-12-23 DIAGNOSIS — I71011 Dissection of aortic arch: Secondary | ICD-10-CM | POA: Insufficient documentation

## 2018-12-23 DIAGNOSIS — R0689 Other abnormalities of breathing: Secondary | ICD-10-CM | POA: Diagnosis not present

## 2018-12-23 DIAGNOSIS — R0902 Hypoxemia: Secondary | ICD-10-CM | POA: Diagnosis not present

## 2018-12-23 DIAGNOSIS — I4949 Other premature depolarization: Secondary | ICD-10-CM | POA: Diagnosis not present

## 2018-12-23 DIAGNOSIS — D62 Acute posthemorrhagic anemia: Secondary | ICD-10-CM | POA: Diagnosis not present

## 2018-12-24 DIAGNOSIS — Z452 Encounter for adjustment and management of vascular access device: Secondary | ICD-10-CM | POA: Diagnosis not present

## 2018-12-24 DIAGNOSIS — I7101 Dissection of thoracic aorta: Secondary | ICD-10-CM | POA: Diagnosis not present

## 2018-12-24 DIAGNOSIS — I10 Essential (primary) hypertension: Secondary | ICD-10-CM | POA: Diagnosis not present

## 2018-12-24 DIAGNOSIS — R0902 Hypoxemia: Secondary | ICD-10-CM | POA: Diagnosis not present

## 2018-12-24 DIAGNOSIS — R0689 Other abnormalities of breathing: Secondary | ICD-10-CM | POA: Diagnosis not present

## 2018-12-24 DIAGNOSIS — D62 Acute posthemorrhagic anemia: Secondary | ICD-10-CM | POA: Diagnosis not present

## 2018-12-25 DIAGNOSIS — Z452 Encounter for adjustment and management of vascular access device: Secondary | ICD-10-CM | POA: Diagnosis not present

## 2018-12-25 DIAGNOSIS — I10 Essential (primary) hypertension: Secondary | ICD-10-CM | POA: Diagnosis not present

## 2018-12-25 DIAGNOSIS — R0689 Other abnormalities of breathing: Secondary | ICD-10-CM | POA: Diagnosis not present

## 2018-12-25 DIAGNOSIS — R0902 Hypoxemia: Secondary | ICD-10-CM | POA: Diagnosis not present

## 2018-12-25 DIAGNOSIS — I7101 Dissection of thoracic aorta: Secondary | ICD-10-CM | POA: Diagnosis not present

## 2018-12-25 DIAGNOSIS — D62 Acute posthemorrhagic anemia: Secondary | ICD-10-CM | POA: Diagnosis not present

## 2018-12-26 DIAGNOSIS — J90 Pleural effusion, not elsewhere classified: Secondary | ICD-10-CM | POA: Diagnosis not present

## 2018-12-26 DIAGNOSIS — J986 Disorders of diaphragm: Secondary | ICD-10-CM | POA: Diagnosis not present

## 2018-12-26 DIAGNOSIS — E1165 Type 2 diabetes mellitus with hyperglycemia: Secondary | ICD-10-CM | POA: Diagnosis not present

## 2018-12-26 DIAGNOSIS — J9811 Atelectasis: Secondary | ICD-10-CM | POA: Diagnosis not present

## 2018-12-26 DIAGNOSIS — R633 Feeding difficulties: Secondary | ICD-10-CM | POA: Diagnosis not present

## 2018-12-26 DIAGNOSIS — Z4682 Encounter for fitting and adjustment of non-vascular catheter: Secondary | ICD-10-CM | POA: Diagnosis not present

## 2018-12-26 DIAGNOSIS — R2689 Other abnormalities of gait and mobility: Secondary | ICD-10-CM | POA: Diagnosis not present

## 2018-12-26 DIAGNOSIS — J939 Pneumothorax, unspecified: Secondary | ICD-10-CM | POA: Diagnosis not present

## 2018-12-26 DIAGNOSIS — I7101 Dissection of thoracic aorta: Secondary | ICD-10-CM | POA: Diagnosis not present

## 2018-12-27 DIAGNOSIS — E1165 Type 2 diabetes mellitus with hyperglycemia: Secondary | ICD-10-CM | POA: Diagnosis not present

## 2018-12-27 DIAGNOSIS — R633 Feeding difficulties: Secondary | ICD-10-CM | POA: Diagnosis not present

## 2018-12-27 MED ORDER — AMLODIPINE BESYLATE 10 MG PO TABS
10.00 | ORAL_TABLET | ORAL | Status: DC
Start: 2018-12-28 — End: 2018-12-27

## 2018-12-27 MED ORDER — BRONDELATE 300-150 MG/15ML OR ELIX
150.00 | ORAL_SOLUTION | ORAL | Status: DC
Start: 2018-12-28 — End: 2018-12-27

## 2018-12-27 MED ORDER — COMPOUND W FREEZE OFF EX AERO
0.00 | INHALATION_SPRAY | CUTANEOUS | Status: DC
Start: 2018-12-27 — End: 2018-12-27

## 2018-12-27 MED ORDER — CVS KIDPANT BOYS X-LARGE MISC
40.00 | Status: DC
Start: 2018-12-28 — End: 2018-12-27

## 2018-12-27 MED ORDER — Medication
5.00 | Status: DC
Start: ? — End: 2018-12-27

## 2018-12-27 MED ORDER — VICON FORTE PO CAPS
17.00 | ORAL_CAPSULE | ORAL | Status: DC
Start: 2018-12-27 — End: 2018-12-27

## 2018-12-27 MED ORDER — OLANZAPINE-FLUOXETINE HCL 6-50 MG PO CAPS
3.00 | ORAL_CAPSULE | ORAL | Status: DC
Start: 2018-12-27 — End: 2018-12-27

## 2018-12-27 MED ORDER — CELLULOSE SODIUM PHOSPHATE VI
5000.00 | Status: DC
Start: 2018-12-27 — End: 2018-12-27

## 2018-12-27 MED ORDER — LABETALOL HCL 5 MG/ML IV SOLN
20.00 | INTRAVENOUS | Status: DC
Start: ? — End: 2018-12-27

## 2018-12-27 MED ORDER — DINO-LIFE PO
50.00 | ORAL | Status: DC
Start: 2018-12-27 — End: 2018-12-27

## 2018-12-27 MED ORDER — GLUCOSAMINE-CHONDROIT-COLLAGEN PO
100.00 | ORAL | Status: DC
Start: 2018-12-27 — End: 2018-12-27

## 2018-12-27 MED ORDER — EQUATE NICOTINE 4 MG MT GUM
4.00 | CHEWING_GUM | OROMUCOSAL | Status: DC
Start: ? — End: 2018-12-27

## 2018-12-27 MED ORDER — Medication
10.00 | Status: DC
Start: ? — End: 2018-12-27

## 2018-12-27 MED ORDER — PHENYLEPHRINE-GUAIFENESIN 20-375 MG PO CP12
20.00 | ORAL_CAPSULE | ORAL | Status: DC
Start: ? — End: 2018-12-27

## 2018-12-27 MED ORDER — NEXTERONE IV
81.00 | INTRAVENOUS | Status: DC
Start: 2018-12-28 — End: 2018-12-27

## 2018-12-27 MED ORDER — QUINERVA 260 MG PO TABS
650.00 | ORAL_TABLET | ORAL | Status: DC
Start: ? — End: 2018-12-27

## 2018-12-27 MED ORDER — BENICAR 20 MG PO TABS
12.50 | ORAL_TABLET | ORAL | Status: DC
Start: ? — End: 2018-12-27

## 2018-12-27 MED ORDER — CALCIUM-MAGNESIUM-VITAMIN D PO
500.00 | ORAL | Status: DC
Start: ? — End: 2018-12-27

## 2018-12-27 MED ORDER — FUROSEMIDE 40 MG PO TABS
40.00 | ORAL_TABLET | ORAL | Status: DC
Start: 2018-12-28 — End: 2018-12-27

## 2018-12-28 HISTORY — PX: REPAIR OF ACUTE ASCENDING THORACIC AORTIC DISSECTION: SHX6323

## 2018-12-29 ENCOUNTER — Telehealth: Payer: Self-pay

## 2018-12-29 NOTE — Telephone Encounter (Signed)
Attempted to reach patient for TCM call and schedule hosp f/u appt. Left msg for pt to contact me directly at [phone] or contact the office.

## 2019-01-10 DIAGNOSIS — Z95828 Presence of other vascular implants and grafts: Secondary | ICD-10-CM | POA: Diagnosis not present

## 2019-01-10 DIAGNOSIS — R0602 Shortness of breath: Secondary | ICD-10-CM | POA: Diagnosis not present

## 2019-01-10 DIAGNOSIS — Z7982 Long term (current) use of aspirin: Secondary | ICD-10-CM | POA: Diagnosis not present

## 2019-01-10 DIAGNOSIS — I7100 Dissection of unspecified site of aorta: Secondary | ICD-10-CM | POA: Diagnosis not present

## 2019-01-10 DIAGNOSIS — I7101 Dissection of thoracic aorta: Secondary | ICD-10-CM | POA: Diagnosis not present

## 2019-01-20 ENCOUNTER — Other Ambulatory Visit: Payer: Self-pay | Admitting: Family Medicine

## 2019-01-20 DIAGNOSIS — F339 Major depressive disorder, recurrent, unspecified: Secondary | ICD-10-CM

## 2019-01-31 ENCOUNTER — Encounter: Payer: Self-pay | Admitting: Family Medicine

## 2019-01-31 ENCOUNTER — Ambulatory Visit (INDEPENDENT_AMBULATORY_CARE_PROVIDER_SITE_OTHER): Payer: PPO | Admitting: Family Medicine

## 2019-01-31 ENCOUNTER — Other Ambulatory Visit: Payer: Self-pay

## 2019-01-31 VITALS — BP 130/76 | HR 76 | Ht 73.0 in | Wt 176.0 lb

## 2019-01-31 DIAGNOSIS — E119 Type 2 diabetes mellitus without complications: Secondary | ICD-10-CM | POA: Diagnosis not present

## 2019-01-31 DIAGNOSIS — F339 Major depressive disorder, recurrent, unspecified: Secondary | ICD-10-CM | POA: Diagnosis not present

## 2019-01-31 DIAGNOSIS — I10 Essential (primary) hypertension: Secondary | ICD-10-CM | POA: Diagnosis not present

## 2019-01-31 DIAGNOSIS — E7801 Familial hypercholesterolemia: Secondary | ICD-10-CM

## 2019-01-31 MED ORDER — METFORMIN HCL 1000 MG PO TABS: 1000.0000 mg | ORAL_TABLET | Freq: Two times a day (BID) | ORAL | 1 refills | Status: DC

## 2019-01-31 MED ORDER — AMLODIPINE BESYLATE 10 MG PO TABS: 10.0000 mg | ORAL_TABLET | Freq: Every day | ORAL | 1 refills | Status: DC

## 2019-01-31 MED ORDER — ATORVASTATIN CALCIUM 40 MG PO TABS: 40.0000 mg | ORAL_TABLET | Freq: Every day | ORAL | 1 refills | Status: DC

## 2019-01-31 MED ORDER — LISINOPRIL-HYDROCHLOROTHIAZIDE 20-12.5 MG PO TABS: 1.0000 | ORAL_TABLET | Freq: Every day | ORAL | 1 refills | Status: DC

## 2019-01-31 MED ORDER — GLIPIZIDE ER 2.5 MG PO TB24: 2.5000 mg | ORAL_TABLET | Freq: Every day | ORAL | 1 refills | Status: DC

## 2019-01-31 MED ORDER — METOPROLOL TARTRATE 50 MG PO TABS: 50.0000 mg | ORAL_TABLET | Freq: Two times a day (BID) | ORAL | 1 refills | Status: DC

## 2019-01-31 MED ORDER — VENLAFAXINE HCL ER 150 MG PO CP24: 150.0000 mg | ORAL_CAPSULE | Freq: Every day | ORAL | 1 refills | Status: DC

## 2019-01-31 NOTE — Progress Notes (Signed)
Date:  01/31/2019   Name:  Andrew [name]   DOB:  Apr 12, 1950   MRN:  [id number]   Chief Complaint: Depression (PHQ9=1), Diabetes, Hypertension, and Hyperlipidemia  Depression        This is a chronic problem.  The current episode started more than 1 year ago.   The onset quality is gradual.   The problem has been gradually improving since onset.  Associated symptoms include appetite change.  Associated symptoms include no decreased concentration, no fatigue, no helplessness, no hopelessness, does not have insomnia, not irritable, no restlessness, no decreased interest, no body aches, no myalgias, no headaches, no indigestion, not sad and no suicidal ideas.     The symptoms are aggravated by nothing.  Past treatments include SSRIs - Selective serotonin reuptake inhibitors.  Compliance with treatment is good.  Previous treatment provided moderate relief.   Pertinent negatives include no hypothyroidism. Diabetes He presents for his follow-up diabetic visit. He has type 2 diabetes mellitus. His disease course has been stable. Pertinent negatives for hypoglycemia include no dizziness, headaches, nervousness/anxiousness or sweats. There are no diabetic associated symptoms. Pertinent negatives for diabetes include no blurred vision, no chest pain, no fatigue and no polydipsia. There are no hypoglycemic complications. Symptoms are stable. There are no diabetic complications. Pertinent negatives for diabetic complications include no autonomic neuropathy, CVA, heart disease, impotence, nephropathy, peripheral neuropathy, PVD or retinopathy. Risk factors for coronary artery disease include diabetes mellitus, dyslipidemia and hypertension. Current diabetic treatment includes oral agent (dual therapy). He is following a generally healthy diet. Meal planning includes avoidance of concentrated sweets and carbohydrate counting. An ACE inhibitor/angiotensin II receptor blocker is being taken.  Hypertension This is a  chronic problem. The current episode started more than 1 year ago. The problem has been gradually improving since onset. The problem is controlled. Pertinent negatives include no blurred vision, chest pain, headaches, neck pain, orthopnea, palpitations, shortness of breath or sweats. There are no associated agents to hypertension. Risk factors for coronary artery disease include dyslipidemia and diabetes mellitus. Past treatments include beta blockers, ACE inhibitors and diuretics. The current treatment provides moderate improvement. There are no compliance problems.  There is no history of angina, kidney disease, CAD/MI, CVA, heart failure, left ventricular hypertrophy, PVD or retinopathy. There is no history of chronic renal disease, a hypertension causing med or renovascular disease.  Hyperlipidemia This is a chronic problem. The current episode started more than 1 year ago. The problem is controlled. Recent lipid tests were reviewed and are normal. He has no history of chronic renal disease, diabetes, hypothyroidism, liver disease, obesity or nephrotic syndrome. Factors aggravating his hyperlipidemia include thiazides. Pertinent negatives include no chest pain, myalgias or shortness of breath. Current antihyperlipidemic treatment includes statins. The current treatment provides moderate improvement of lipids. There are no compliance problems.  Risk factors for coronary artery disease include dyslipidemia.    Review of Systems  Constitutional: Positive for appetite change. Negative for chills, fatigue and fever.  HENT: Negative for drooling, ear discharge, ear pain and sore throat.   Eyes: Negative for blurred vision.  Respiratory: Negative for cough, shortness of breath and wheezing.   Cardiovascular: Negative for chest pain, palpitations, orthopnea and leg swelling.  Gastrointestinal: Negative for abdominal pain, blood in stool, constipation, diarrhea and nausea.  Endocrine: Negative for polydipsia.   Genitourinary: Negative for dysuria, frequency, hematuria, impotence and urgency.  Musculoskeletal: Negative for back pain, myalgias and neck pain.  Skin: Negative for rash.  Allergic/Immunologic:  Negative for environmental allergies.  Neurological: Negative for dizziness and headaches.  Hematological: Does not bruise/bleed easily.  Psychiatric/Behavioral: Positive for depression. Negative for decreased concentration and suicidal ideas. The patient is not nervous/anxious and does not have insomnia.     Patient Active Problem List   Diagnosis Date Noted  . Aortic dissection proximal to innominate ([place]) 12/23/2018  . Mixed hyperlipidemia 10/05/2016  . Familial multiple lipoprotein-type hyperlipidemia 08/02/2014  . Routine general medical examination at a health care facility 08/02/2014  . Recurrent major depressive episodes ([place]) 08/02/2014  . Essential (primary) hypertension 08/02/2014  . Overweight 08/02/2014  . Diabetes mellitus, type 2 ([place]) 08/02/2014    No Known Allergies  Past Surgical History:  Procedure Laterality Date  . NASAL SINUS SURGERY      Social History   Tobacco Use  . Smoking status: Never Smoker  . Smokeless tobacco: Never Used  . Tobacco comment: smoking cessation materials not required  Substance Use Topics  . Alcohol use: Yes    Alcohol/week: 0.0 standard drinks    Frequency: Never  . Drug use: No     Medication list has been reviewed and updated.  Current Meds  Medication Sig  . amLODipine (NORVASC) 10 MG tablet Take 1 tablet (10 mg total) by mouth daily.  Marland Kitchen atorvastatin (LIPITOR) 40 MG tablet Take 1 tablet by mouth daily.  Marland Kitchen glipiZIDE (GLUCOTROL XL) 2.5 MG 24 hr tablet Take 1 tablet (2.5 mg total) by mouth daily with breakfast.  . glucose blood test strip Use as instructed  . lisinopril-hydrochlorothiazide (ZESTORETIC) 20-12.5 MG tablet Take 1 tablet by mouth daily.  . metFORMIN (GLUCOPHAGE) 1000 MG tablet Take 1 tablet (1,000 mg total) by  mouth 2 (two) times daily.  . metoprolol tartrate (LOPRESSOR) 50 MG tablet Take 1 tablet by mouth 2 (two) times daily.  Marland Kitchen venlafaxine XR (EFFEXOR-XR) 150 MG 24 hr capsule TAKE 1 CAPSULE (150 MG TOTAL) BY MOUTH DAILY WITH BREAKFAST.    PHQ 2/9 Scores 01/31/2019 07/31/2018 05/12/2018 05/03/2018  PHQ - 2 Score 0 0 0 0  PHQ- 9 Score 1 0 2 1    BP Readings from Last 3 Encounters:  01/31/19 130/76  12/22/18 (!) 147/88  07/31/18 140/70    Physical Exam Vitals signs and nursing note reviewed.  Constitutional:      General: He is not irritable. HENT:     Head: Normocephalic.     Right Ear: Tympanic membrane and external ear normal.     Left Ear: Tympanic membrane and external ear normal.     Nose: Nose normal.  Eyes:     General: No scleral icterus.       Right eye: No discharge.        Left eye: No discharge.     Conjunctiva/sclera: Conjunctivae normal.     Pupils: Pupils are equal, round, and reactive to light.  Neck:     Musculoskeletal: Normal range of motion and neck supple.     Thyroid: No thyromegaly.     Vascular: No JVD.     Trachea: No tracheal deviation.  Cardiovascular:     Rate and Rhythm: Normal rate and regular rhythm.     Pulses: Normal pulses.     Heart sounds: Normal heart sounds, S1 normal and S2 normal. No murmur. No systolic murmur. No diastolic murmur. No friction rub. No gallop. No S3 or S4 sounds.   Pulmonary:     Effort: No respiratory distress.     Breath sounds: Normal  breath sounds. No wheezing or rales.  Abdominal:     General: Bowel sounds are normal.     Palpations: Abdomen is soft. There is no mass.     Tenderness: There is no abdominal tenderness. There is no guarding or rebound.  Musculoskeletal: Normal range of motion.        General: No tenderness.     Right lower leg: No edema.     Left lower leg: No edema.  Lymphadenopathy:     Cervical: No cervical adenopathy.  Skin:    General: Skin is warm.     Findings: No rash.  Neurological:      Mental Status: He is alert and oriented to person, place, and time.     Cranial Nerves: No cranial nerve deficit.     Deep Tendon Reflexes: Reflexes are normal and symmetric.     Wt Readings from Last 3 Encounters:  01/31/19 176 lb (79.8 kg)  12/21/18 180 lb (81.6 kg)  07/31/18 178 lb (80.7 kg)    BP 130/76   Pulse 76   Ht 6\' 1"  (1.854 m)   Wt 176 lb (79.8 kg)   BMI 23.22 kg/m   Assessment and Plan: 1. Essential (primary) hypertension Chronic.  Controlled.  Presently stable.  Patient is status post repair of aneurysm and is tolerating postsurgical circumstances well.  We will continue lisinopril hydrochlorothiazide 20-12 0.5 amlodipine 10 mg once a day and metoprolol 50 mg 1 tablet twice a day.  Will check renal function panel. - Renal Function Panel - lisinopril-hydrochlorothiazide (ZESTORETIC) 20-12.5 MG tablet; Take 1 tablet by mouth daily.  Dispense: 90 tablet; Refill: 1 - amLODipine (NORVASC) 10 MG tablet; Take 1 tablet (10 mg total) by mouth daily.  Dispense: 90 tablet; Refill: 1 - metoprolol tartrate (LOPRESSOR) 50 MG tablet; Take 1 tablet (50 mg total) by mouth 2 (two) times daily.  Dispense: 90 tablet; Refill: 1  2. Recurrent major depressive episodes (HCC) Chronic.  Controlled.  PHQ score 1 with gad score 0 which is excellent concerning his recent hospitalization.  We will continue venlafaxine XR 150 mg once a day.  Will recheck in 6 months - venlafaxine XR (EFFEXOR-XR) 150 MG 24 hr capsule; Take 1 capsule (150 mg total) by mouth daily with breakfast.  Dispense: 90 capsule; Refill: 1  3. Type 2 diabetes mellitus without complication, without long-term current use of insulin (HCC) Chronic.  Controlled.  Stable.  Will continue glipizide XL 2.51 tablet a day and Metformin 1 g twice a day.  Will check A1c.  We will recheck patient in 6 months. - Hemoglobin A1c - glipiZIDE (GLUCOTROL XL) 2.5 MG 24 hr tablet; Take 1 tablet (2.5 mg total) by mouth daily with breakfast.  Dispense:  90 tablet; Refill: 1 - metFORMIN (GLUCOPHAGE) 1000 MG tablet; Take 1 tablet (1,000 mg total) by mouth 2 (two) times daily.  Dispense: 180 tablet; Refill: 1  4. Familial hypercholesterolemia Patient recently started on atorvastatin 40 mg once a day.  Will check lipid panel to determine level of LDL. - Lipid Panel With LDL/HDL Ratio - atorvastatin (LIPITOR) 40 MG tablet; Take 1 tablet (40 mg total) by mouth daily.  Dispense: 90 tablet; Refill: 1

## 2019-02-01 LAB — RENAL FUNCTION PANEL
Albumin: 4.7 g/dL (ref 3.8–4.8)
BUN/Creatinine Ratio: 24 (ref 10–24)
BUN: 23 mg/dL (ref 8–27)
CO2: 25 mmol/L (ref 20–29)
Calcium: 10.4 mg/dL — ABNORMAL HIGH (ref 8.6–10.2)
Chloride: 99 mmol/L (ref 96–106)
Creatinine, Ser: 0.94 mg/dL (ref 0.76–1.27)
GFR calc Af Amer: 97 mL/min/{1.73_m2} (ref >59)
GFR calc non Af Amer: 84 mL/min/{1.73_m2} (ref >59)
Glucose: 100 mg/dL — ABNORMAL HIGH (ref 65–99)
Phosphorus: 4 mg/dL (ref 2.8–4.1)
Potassium: 5.1 mmol/L (ref 3.5–5.2)
Sodium: 143 mmol/L (ref 134–144)

## 2019-02-01 LAB — HEMOGLOBIN A1C
Est. average glucose Bld gHb Est-mCnc: 111 mg/dL
Hgb A1c MFr Bld: 5.5 % (ref 4.8–5.6)

## 2019-02-01 LAB — LIPID PANEL WITH LDL/HDL RATIO
Cholesterol, Total: 121 mg/dL (ref 100–199)
HDL: 48 mg/dL (ref >39)
LDL Chol Calc (NIH): 59 mg/dL (ref 0–99)
LDL/HDL Ratio: 1.2 ratio (ref 0.0–3.6)
Triglycerides: 70 mg/dL (ref 0–149)
VLDL Cholesterol Cal: 14 mg/dL (ref 5–40)

## 2019-02-08 DIAGNOSIS — I10 Essential (primary) hypertension: Secondary | ICD-10-CM | POA: Diagnosis not present

## 2019-02-08 DIAGNOSIS — K4020 Bilateral inguinal hernia, without obstruction or gangrene, not specified as recurrent: Secondary | ICD-10-CM | POA: Diagnosis not present

## 2019-02-08 DIAGNOSIS — Z952 Presence of prosthetic heart valve: Secondary | ICD-10-CM | POA: Diagnosis not present

## 2019-02-08 DIAGNOSIS — N200 Calculus of kidney: Secondary | ICD-10-CM | POA: Diagnosis not present

## 2019-02-08 DIAGNOSIS — N400 Enlarged prostate without lower urinary tract symptoms: Secondary | ICD-10-CM | POA: Diagnosis not present

## 2019-02-08 DIAGNOSIS — K449 Diaphragmatic hernia without obstruction or gangrene: Secondary | ICD-10-CM | POA: Diagnosis not present

## 2019-02-08 DIAGNOSIS — K5730 Diverticulosis of large intestine without perforation or abscess without bleeding: Secondary | ICD-10-CM | POA: Diagnosis not present

## 2019-02-08 DIAGNOSIS — J9811 Atelectasis: Secondary | ICD-10-CM | POA: Diagnosis not present

## 2019-02-08 DIAGNOSIS — I7101 Dissection of thoracic aorta: Secondary | ICD-10-CM | POA: Diagnosis not present

## 2019-02-08 DIAGNOSIS — I712 Thoracic aortic aneurysm, without rupture: Secondary | ICD-10-CM | POA: Diagnosis not present

## 2019-04-14 IMAGING — CR DG SHOULDER 2+V*L*
3 series · 3 of 3 positions shown · non-contrast
Comparison: None.

CLINICAL DATA: Fell yesterday landing on LEFT shoulder. Pain and
decreased range of motion.

EXAM:
LEFT SHOULDER - 2+ VIEW

[shoulder grashey]
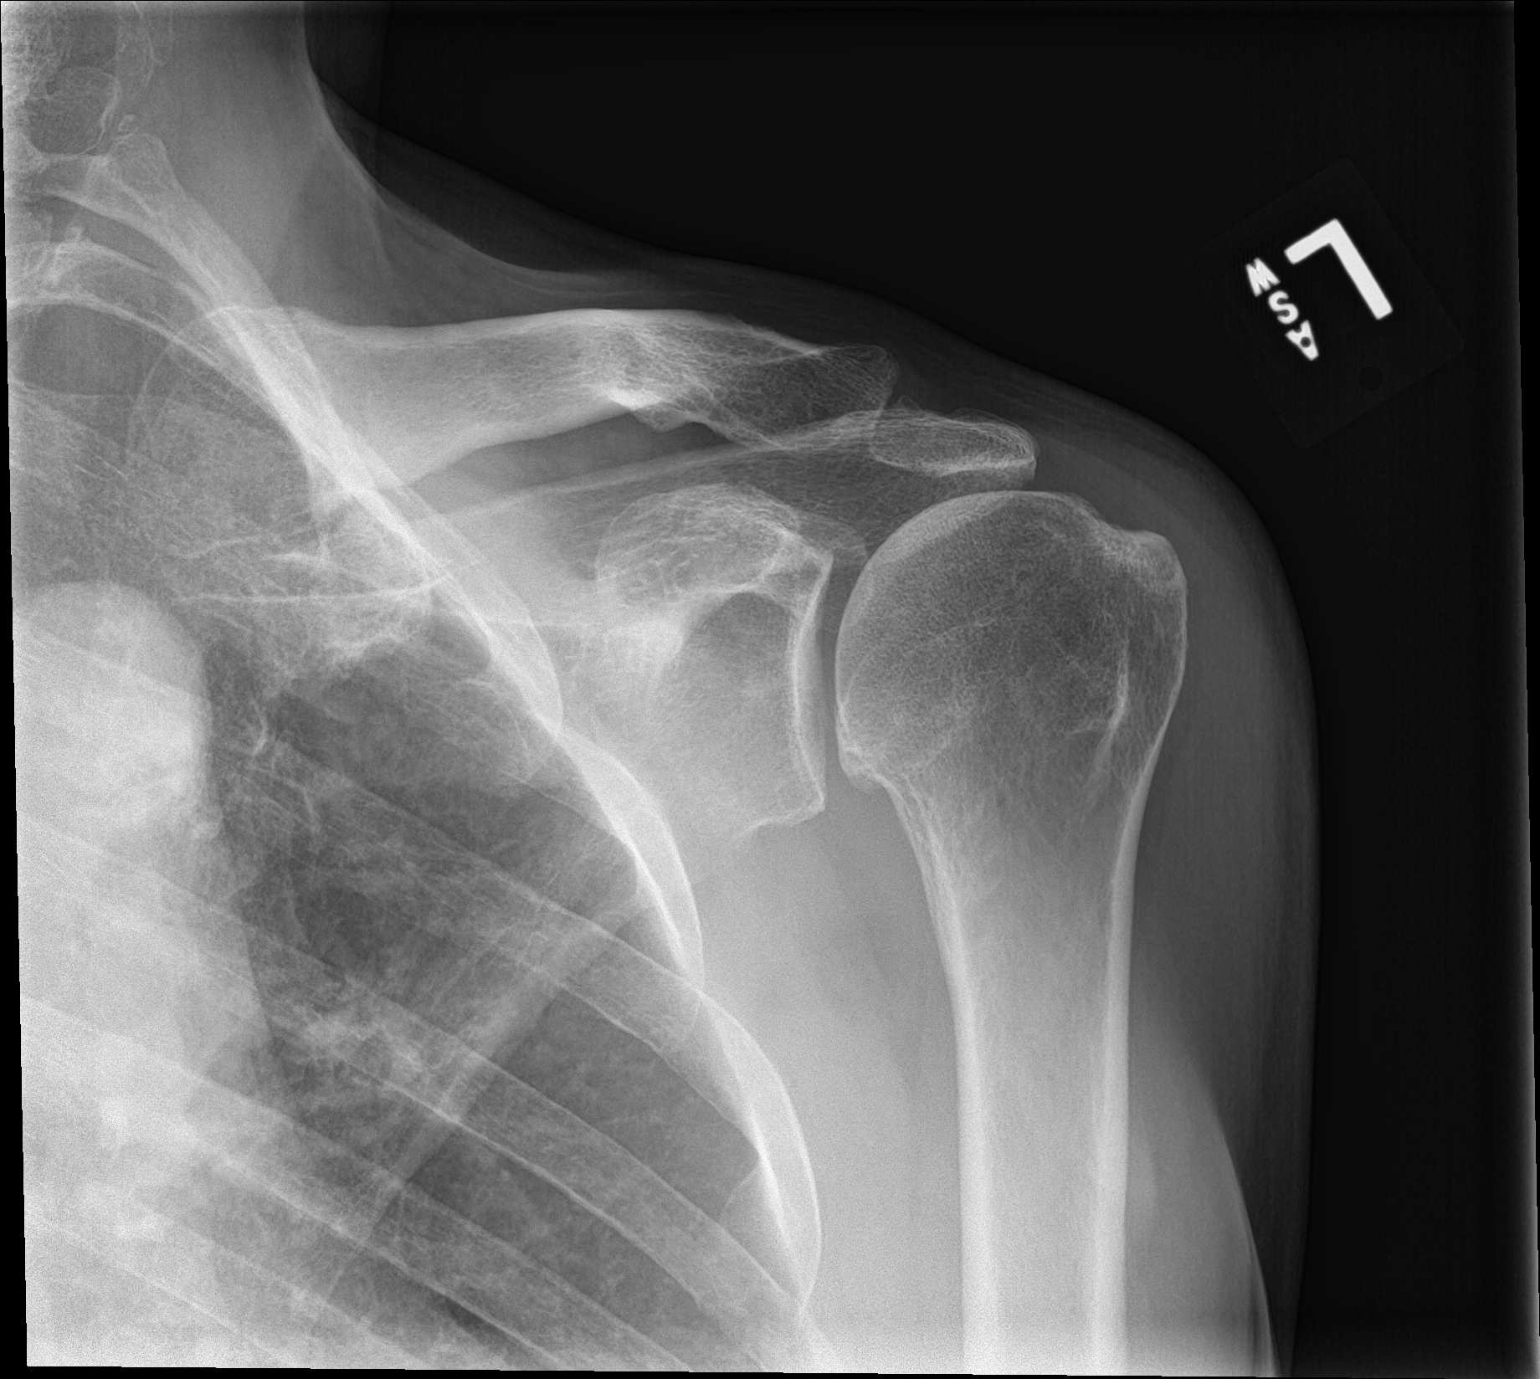

[shoulder y view]
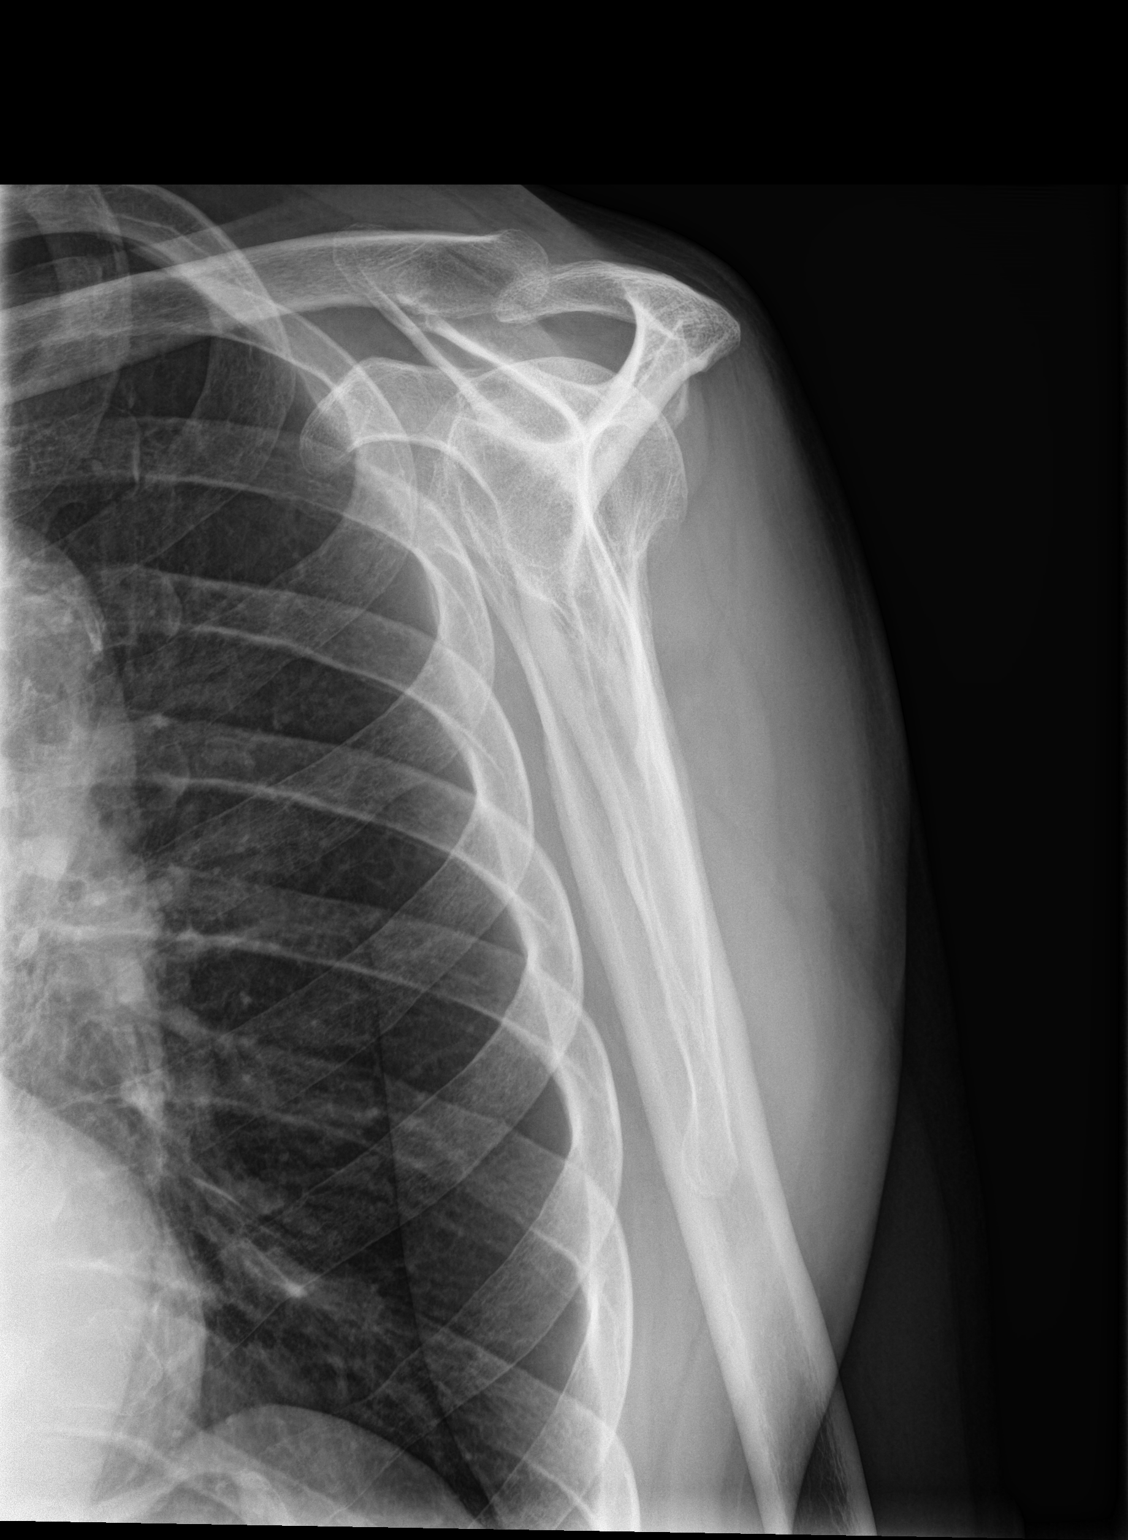

[shoulder axial]
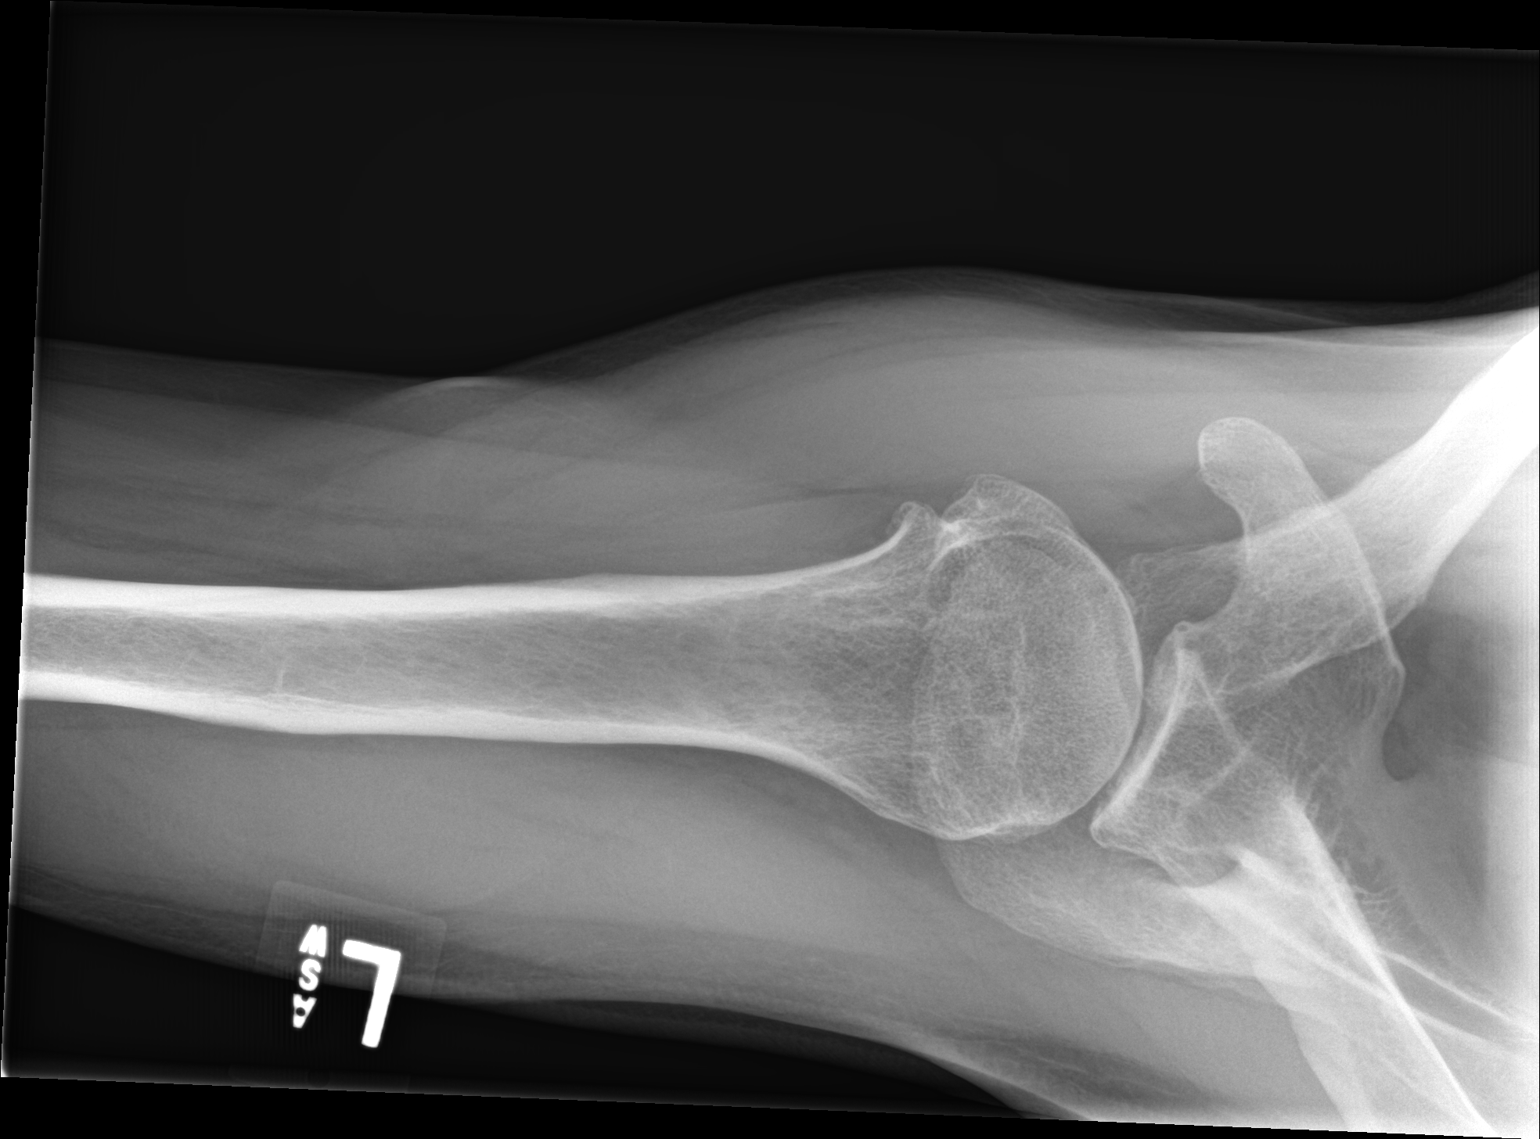

[3 of 3 positions shown; findings below may reference images not displayed]

FINDINGS: The humeral head is well-formed and located. The subacromial,
glenohumeral and acromioclavicular joint spaces are intact. Mildly
no humeral undersurface spurring. No destructive bony lesions. Soft
tissue planes are non-suspicious.
IMPRESSION: No fracture deformity or dislocation.

Mild degenerative change.

## 2019-05-05 ENCOUNTER — Other Ambulatory Visit: Payer: Self-pay | Admitting: Family Medicine

## 2019-05-05 DIAGNOSIS — I10 Essential (primary) hypertension: Secondary | ICD-10-CM

## 2019-05-07 ENCOUNTER — Ambulatory Visit (INDEPENDENT_AMBULATORY_CARE_PROVIDER_SITE_OTHER): Payer: PPO

## 2019-05-07 VITALS — BP 139/77 | Temp 97.5°F | Ht 73.0 in | Wt 176.0 lb

## 2019-05-07 DIAGNOSIS — Z0000 Encounter for general adult medical examination without abnormal findings: Secondary | ICD-10-CM

## 2019-05-07 DIAGNOSIS — Z1211 Encounter for screening for malignant neoplasm of colon: Secondary | ICD-10-CM

## 2019-05-07 NOTE — Progress Notes (Signed)
Subjective:   Andrew [name] is a 69 y.o. male who presents for Medicare Annual/Subsequent preventive examination.  Virtual Visit via Telephone Note  I connected with Andrew [name] on 05/07/19 at  9:20 AM EST by telephone and verified that I am speaking with the correct person using two identifiers.  Medicare Annual Wellness visit completed telephonically due to Covid-19 pandemic.   Location: Patient: home Provider: office   I discussed the limitations, risks, security and privacy concerns of performing an evaluation and management service by telephone and the availability of in person appointments. The patient expressed understanding and agreed to proceed.  Some vital signs may be absent or patient reported.   [name], LPN    Review of Systems:   Cardiac Risk Factors include: advanced age (>73men, >73 women);diabetes mellitus;dyslipidemia;male gender;hypertension     Objective:    Vitals: BP 139/77   Temp (!) 97.5 F (36.4 C)   Ht 6\' 1"  (1.854 m)   Wt 176 lb (79.8 kg)   SpO2 94%   BMI 23.22 kg/m   Body mass index is 23.22 kg/m.  Advanced Directives 05/07/2019 12/21/2018 05/03/2018 04/28/2017  Does Patient Have a Medical Advance Directive? Yes No No No  Type of Paramedic of [place];Living will - - -  Copy of [place] in Chart? No - copy requested - - -  Would patient like information on creating a medical advance directive? - No - Patient declined Yes (MAU/Ambulatory/Procedural Areas - Information given) Yes (MAU/Ambulatory/Procedural Areas - Information given)    Tobacco Social History   Tobacco Use  Smoking Status Never Smoker  Smokeless Tobacco Never Used  Tobacco Comment   smoking cessation materials not required     Counseling given: Not Answered Comment: smoking cessation materials not required   Clinical Intake:  Pre-visit preparation completed: Yes  Pain : No/denies pain     BMI - recorded:  23.22 Nutritional Status: BMI of 19-24  Normal Nutritional Risks: None Diabetes: Yes CBG done?: No Did pt. bring in CBG monitor from home?: No   Nutrition Risk Assessment:  Has the patient had any N/V/D within the last 2 months?  No  Does the patient have any non-healing wounds?  No  Has the patient had any unintentional weight loss or weight gain?  No   Diabetes:  Is the patient diabetic?  Yes  If diabetic, was a CBG obtained today?  No  Did the patient bring in their glucometer from home?  No  How often do you monitor your CBG's? 2-3 times per week.   Financial Strains and Diabetes Management:  Are you having any financial strains with the device, your supplies or your medication? No .  Does the patient want to be seen by Chronic Care Management for management of their diabetes?  No  Would the patient like to be referred to a Nutritionist or for Diabetic Management?  No   Diabetic Exams:  Diabetic Eye Exam: Completed per patient. [facility].   Diabetic Foot Exam: Completed 07/31/18.   How often do you need to have someone help you when you read instructions, pamphlets, or other written materials from your doctor or pharmacy?: 1 - Never  Interpreter Needed?: No  Information entered by :: [name] LPN  Past Medical History:  Diagnosis Date  . Depression   . Diabetes mellitus without complication ([place])   . Hyperlipidemia   . Hypertension    Past  Surgical History:  Procedure Laterality Date  . NASAL SINUS SURGERY    . REPAIR OF ACUTE ASCENDING THORACIC AORTIC DISSECTION  12/2018   Family History  Problem Relation Age of Onset  . Heart disease Mother   . Healthy Father    Social History   Socioeconomic History  . Marital status: Married    Spouse name: Not on file  . Number of children: 1  . Years of education: Not on file  . Highest education level: Associate degree: academic program  Occupational History  . Occupation:  Retired  Tobacco Use  . Smoking status: Never Smoker  . Smokeless tobacco: Never Used  . Tobacco comment: smoking cessation materials not required  Substance and Sexual Activity  . Alcohol use: Yes    Alcohol/week: 1.0 standard drinks    Types: 1 Glasses of wine per week    Comment: weekly  . Drug use: No  . Sexual activity: Not Currently  Other Topics Concern  . Not on file  Social History Narrative  . Not on file   Social Determinants of Health   Financial Resource Strain: Low Risk   . Difficulty of Paying Living Expenses: Not hard at all  Food Insecurity: No Food Insecurity  . Worried About Charity fundraiser in the Last Year: Never true  . Ran Out of Food in the Last Year: Never true  Transportation Needs: No Transportation Needs  . Lack of Transportation (Medical): No  . Lack of Transportation (Non-Medical): No  Physical Activity: Sufficiently Active  . Days of Exercise per Week: 7 days  . Minutes of Exercise per Session: 40 min  Stress: No Stress Concern Present  . Feeling of Stress : Only a little  Social Connections: Somewhat Isolated  . Frequency of Communication with Friends and Family: More than three times a week  . Frequency of Social Gatherings with Friends and Family: Twice a week  . Attends Religious Services: Never  . Active Member of Clubs or Organizations: No  . Attends Archivist Meetings: Never  . Marital Status: Married    Outpatient Encounter Medications as of 05/07/2019  Medication Sig  . amLODipine (NORVASC) 10 MG tablet Take 1 tablet (10 mg total) by mouth daily.  Marland Kitchen aspirin 81 MG chewable tablet Chew 1 tablet by mouth daily.  Marland Kitchen atorvastatin (LIPITOR) 40 MG tablet Take 1 tablet (40 mg total) by mouth daily.  Marland Kitchen glipiZIDE (GLUCOTROL XL) 2.5 MG 24 hr tablet Take 1 tablet (2.5 mg total) by mouth daily with breakfast.  . glucose blood test strip Use as instructed  . lisinopril-hydrochlorothiazide (ZESTORETIC) 20-12.5 MG tablet Take 1 tablet  by mouth daily.  . metFORMIN (GLUCOPHAGE) 1000 MG tablet Take 1 tablet (1,000 mg total) by mouth 2 (two) times daily.  . metoprolol tartrate (LOPRESSOR) 50 MG tablet TAKE 1 TABLET BY MOUTH TWICE A DAY  . venlafaxine XR (EFFEXOR-XR) 150 MG 24 hr capsule Take 1 capsule (150 mg total) by mouth daily with breakfast.   No facility-administered encounter medications on file as of 05/07/2019.    Activities of Daily Living In your present state of health, do you have any difficulty performing the following activities: 05/07/2019 05/07/2019  Hearing? N N  Comment declines hearing aids -  Vision? N -  Difficulty concentrating or making decisions? N -  Walking or climbing stairs? N -  Dressing or bathing? N -  Doing errands, shopping? N -  Preparing Food and eating ? N -  Using the Toilet? N -  In the past six months, have you accidently leaked urine? N -  Do you have problems with loss of bowel control? N -  Managing your Medications? N -  Managing your Finances? N -  Housekeeping or managing your Housekeeping? N -  Some recent data might be hidden    Patient Care Team: Juline Patch, MD as PCP - General (Family Medicine)   Assessment:   This is a routine wellness examination for [place].  Exercise Activities and Dietary recommendations Current Exercise Habits: Home exercise routine, Type of exercise: walking, Time (Minutes): 40, Frequency (Times/Week): 7, Weekly Exercise (Minutes/Week): 280, Intensity: Moderate, Exercise limited by: None identified  Goals    . DIET - INCREASE WATER INTAKE     Recommend to drink at least 6-8 8oz glasses of water per day.    . Patient Stated     Patient states he would like to maintain health with physical activity and healthy eating.        Fall Risk Fall Risk  05/07/2019 05/03/2018 04/28/2017 10/05/2016 08/07/2015  Falls in the past year? 1 1 No No No  Number falls in past yr: 1 1 - - -  Comment fell off step ladder in july, no injury - - - -  Injury  with Fall? 0 0 - - -  Risk for fall due to : No Fall Risks - - - -  Follow up Falls prevention discussed Falls prevention discussed - - -   FALL RISK PREVENTION PERTAINING TO THE HOME:  Any stairs in or around the home? Yes  If so, do they handrails? Yes   Home free of loose throw rugs in walkways, pet beds, electrical cords, etc? Yes  Adequate lighting in your home to reduce risk of falls? Yes   ASSISTIVE DEVICES UTILIZED TO PREVENT FALLS:  Life alert? No  Use of a cane, walker or w/c? No  Grab bars in the bathroom? Yes  Shower chair or bench in shower? Yes Elevated toilet seat or a handicapped toilet? Yes   DME ORDERS:  DME order needed?  No   TIMED UP AND GO:  Was the test performed? No . Telephonic visit. No  Education: Fall risk prevention has been discussed.  Intervention(s) required? No   Depression Screen PHQ 2/9 Scores 05/07/2019 01/31/2019 07/31/2018 05/12/2018  PHQ - 2 Score 0 0 0 0  PHQ- 9 Score - 1 0 2    Cognitive Function     6CIT Screen 05/07/2019 05/03/2018 04/28/2017  What Year? 0 points 0 points 0 points  What month? 0 points 0 points 0 points  What time? 0 points 0 points 0 points  Count back from 20 0 points 0 points 0 points  Months in reverse 0 points 0 points 0 points  Repeat phrase 2 points 0 points 0 points  Total Score 2 0 0    Immunization History  Administered Date(s) Administered  . Influenza, High Dose Seasonal PF 04/28/2017, 05/03/2018  . Influenza,inj,Quad PF,6+ Mos 02/17/2015, 12/27/2018  . Influenza-Unspecified 02/17/2015  . Pneumococcal Conjugate-13 04/28/2017  . Pneumococcal Polysaccharide-23 05/03/2018  . Tdap 05/06/2017    Qualifies for Shingles Vaccine? Yes . Due for Shingrix. Education has been provided regarding the importance of this vaccine. Pt has been advised to call insurance company to determine out of pocket expense. Advised may also receive vaccine at local pharmacy or Health Dept. Verbalized acceptance and  understanding.  Tdap: Up to date  Flu Vaccine: Up to date  Pneumococcal Vaccine: Up to date   Screening Tests Health Maintenance  Topic Date Due  . COLONOSCOPY  02/20/2019  . Hepatitis C Screening  05/13/2019 (Originally October 11, 1950)  . FOOT EXAM  07/31/2019  . HEMOGLOBIN A1C  07/31/2019  . TETANUS/TDAP  05/07/2027  . INFLUENZA VACCINE  Completed  . PNA vac Low Risk Adult  Completed  . OPHTHALMOLOGY EXAM  Discontinued   Cancer Screenings:  Colorectal Screening: Completed 01/2009. Repeat every 10 years. Referral to GI placed today. Pt aware the office will call re: appt.  Lung Cancer Screening: (Low Dose CT Chest recommended if Age 5-80 years, 30 pack-year currently smoking OR have quit w/in 15years.) does not qualify.   Additional Screening:  Hepatitis C Screening: does qualify; due  Vision Screening: Recommended annual ophthalmology exams for early detection of glaucoma and other disorders of the eye. Is the patient up to date with their annual eye exam?  Yes  Who is the provider or what is the name of the office in which the pt attends annual eye exams? [place] Screening: Recommended annual dental exams for proper oral hygiene  Community Resource Referral:  CRR required this visit?  No       Plan:    I have personally reviewed and addressed the Medicare Annual Wellness questionnaire and have noted the following in the patient's chart:  A. Medical and social history B. Use of alcohol, tobacco or illicit drugs  C. Current medications and supplements D. Functional ability and status E.  Nutritional status F.  Physical activity G. Advance directives H. List of other physicians I.  Hospitalizations, surgeries, and ER visits in previous 12 months J.  [place] such as hearing and vision if needed, cognitive and depression L. Referrals and appointments   In addition, I have reviewed and discussed with patient certain preventive  protocols, quality metrics, and best practice recommendations. A written personalized care plan for preventive services as well as general preventive health recommendations were provided to patient.   Signed,  [name], LPN Nurse Health Advisor   Nurse Notes: pt states his glucometer is not working properly and less than a year old. Advised to contact 800# with meter for troubleshooting assistance or replacement request.

## 2019-05-07 NOTE — Patient Instructions (Signed)
Andrew [name] , Thank you for taking time to come for your Medicare Wellness Visit. I appreciate your ongoing commitment to your health goals. Please review the following plan we discussed and let me know if I can assist you in the future.   Screening recommendations/referrals: Colonoscopy: Referral sent to GI today for repeat screening colonoscopy.  Recommended yearly ophthalmology/optometry visit for glaucoma screening and checkup Recommended yearly dental visit for hygiene and checkup  Vaccinations: Influenza vaccine: done 12/27/18 Pneumococcal vaccine: done 05/03/18 Tdap vaccine: done 05/06/17 Shingles vaccine: Shingrix discussed. Please contact your pharmacy for coverage information.   Advanced directives: Please bring a copy of your health care power of attorney and living will to the office at your convenience.  Conditions/risks identified: Keep up the great work!  Next appointment: Please follow up in one year for your Medicare Annual Wellness visit.    Preventive Care 68 Years and Older, Male Preventive care refers to lifestyle choices and visits with your health care provider that can promote health and wellness. What does preventive care include?  A yearly physical exam. This is also called an annual well check.  Dental exams once or twice a year.  Routine eye exams. Ask your health care provider how often you should have your eyes checked.  Personal lifestyle choices, including:  Daily care of your teeth and gums.  Regular physical activity.  Eating a healthy diet.  Avoiding tobacco and drug use.  Limiting alcohol use.  Practicing safe sex.  Taking low doses of aspirin every day.  Taking vitamin and mineral supplements as recommended by your health care provider. What happens during an annual well check? The services and screenings done by your health care provider during your annual well check will depend on your age, overall health, lifestyle risk factors, and  family history of disease. Counseling  Your health care provider may ask you questions about your:  Alcohol use.  Tobacco use.  Drug use.  Emotional well-being.  Home and relationship well-being.  Sexual activity.  Eating habits.  History of falls.  Memory and ability to understand (cognition).  Work and work Statistician. Screening  You may have the following tests or measurements:  Height, weight, and BMI.  Blood pressure.  Lipid and cholesterol levels. These may be checked every 5 years, or more frequently if you are over 38 years old.  Skin check.  Lung cancer screening. You may have this screening every year starting at age 68 if you have a 30-pack-year history of smoking and currently smoke or have quit within the past 15 years.  Fecal occult blood test (FOBT) of the stool. You may have this test every year starting at age 68.  Flexible sigmoidoscopy or colonoscopy. You may have a sigmoidoscopy every 5 years or a colonoscopy every 10 years starting at age 76.  Prostate cancer screening. Recommendations will vary depending on your family history and other risks.  Hepatitis C blood test.  Hepatitis B blood test.  Sexually transmitted disease (STD) testing.  Diabetes screening. This is done by checking your blood sugar (glucose) after you have not eaten for a while (fasting). You may have this done every 1-3 years.  Abdominal aortic aneurysm (AAA) screening. You may need this if you are a current or former smoker.  Osteoporosis. You may be screened starting at age 68 if you are at high risk. Talk with your health care provider about your test results, treatment options, and if necessary, the need for more tests. Vaccines  Your health care provider may recommend certain vaccines, such as:  Influenza vaccine. This is recommended every year.  Tetanus, diphtheria, and acellular pertussis (Tdap, Td) vaccine. You may need a Td booster every 10 years.  Zoster  vaccine. You may need this after age 68.  Pneumococcal 13-valent conjugate (PCV13) vaccine. One dose is recommended after age 68.  Pneumococcal polysaccharide (PPSV23) vaccine. One dose is recommended after age 68. Talk to your health care provider about which screenings and vaccines you need and how often you need them. This information is not intended to replace advice given to you by your health care provider. Make sure you discuss any questions you have with your health care provider. Document Released: 04/11/2015 Document Revised: 12/03/2015 Document Reviewed: 01/14/2015 Elsevier Interactive Patient Education  2017 [place] Prevention in the Home Falls can cause injuries. They can happen to people of all ages. There are many things you can do to make your home safe and to help prevent falls. What can I do on the outside of my home?  Regularly fix the edges of walkways and driveways and fix any cracks.  Remove anything that might make you trip as you walk through a door, such as a raised step or threshold.  Trim any bushes or trees on the path to your home.  Use bright outdoor lighting.  Clear any walking paths of anything that might make someone trip, such as rocks or tools.  Regularly check to see if handrails are loose or broken. Make sure that both sides of any steps have handrails.  Any raised decks and porches should have guardrails on the edges.  Have any leaves, snow, or ice cleared regularly.  Use sand or salt on walking paths during winter.  Clean up any spills in your garage right away. This includes oil or grease spills. What can I do in the bathroom?  Use night lights.  Install grab bars by the toilet and in the tub and shower. Do not use towel bars as grab bars.  Use non-skid mats or decals in the tub or shower.  If you need to sit down in the shower, use a plastic, non-slip stool.  Keep the floor dry. Clean up any water that spills on the  floor as soon as it happens.  Remove soap buildup in the tub or shower regularly.  Attach bath mats securely with double-sided non-slip rug tape.  Do not have throw rugs and other things on the floor that can make you trip. What can I do in the bedroom?  Use night lights.  Make sure that you have a light by your bed that is easy to reach.  Do not use any sheets or blankets that are too big for your bed. They should not hang down onto the floor.  Have a firm chair that has side arms. You can use this for support while you get dressed.  Do not have throw rugs and other things on the floor that can make you trip. What can I do in the kitchen?  Clean up any spills right away.  Avoid walking on wet floors.  Keep items that you use a lot in easy-to-reach places.  If you need to reach something above you, use a strong step stool that has a grab bar.  Keep electrical cords out of the way.  Do not use floor polish or wax that makes floors slippery. If you must use wax, use non-skid floor wax.  Do  not have throw rugs and other things on the floor that can make you trip. What can I do with my stairs?  Do not leave any items on the stairs.  Make sure that there are handrails on both sides of the stairs and use them. Fix handrails that are broken or loose. Make sure that handrails are as long as the stairways.  Check any carpeting to make sure that it is firmly attached to the stairs. Fix any carpet that is loose or worn.  Avoid having throw rugs at the top or bottom of the stairs. If you do have throw rugs, attach them to the floor with carpet tape.  Make sure that you have a light switch at the top of the stairs and the bottom of the stairs. If you do not have them, ask someone to add them for you. What else can I do to help prevent falls?  Wear shoes that:  Do not have high heels.  Have rubber bottoms.  Are comfortable and fit you well.  Are closed at the toe. Do not wear  sandals.  If you use a stepladder:  Make sure that it is fully opened. Do not climb a closed stepladder.  Make sure that both sides of the stepladder are locked into place.  Ask someone to hold it for you, if possible.  Clearly mark and make sure that you can see:  Any grab bars or handrails.  First and last steps.  Where the edge of each step is.  Use tools that help you move around (mobility aids) if they are needed. These include:  Canes.  Walkers.  Scooters.  Crutches.  Turn on the lights when you go into a dark area. Replace any light bulbs as soon as they burn out.  Set up your furniture so you have a clear path. Avoid moving your furniture around.  If any of your floors are uneven, fix them.  If there are any pets around you, be aware of where they are.  Review your medicines with your doctor. Some medicines can make you feel dizzy. This can increase your chance of falling. Ask your doctor what other things that you can do to help prevent falls. This information is not intended to replace advice given to you by your health care provider. Make sure you discuss any questions you have with your health care provider. Document Released: 01/09/2009 Document Revised: 08/21/2015 Document Reviewed: 04/19/2014 Elsevier Interactive Patient Education  2017 [employer].

## 2019-05-14 ENCOUNTER — Telehealth: Payer: Self-pay

## 2019-05-14 ENCOUNTER — Other Ambulatory Visit: Payer: Self-pay

## 2019-05-14 DIAGNOSIS — Z1211 Encounter for screening for malignant neoplasm of colon: Secondary | ICD-10-CM

## 2019-05-14 MED ORDER — NA SULFATE-K SULFATE-MG SULF 17.5-3.13-1.6 GM/177ML PO SOLN: 354.0000 mL | Freq: Once | ORAL | 0 refills | Status: AC

## 2019-05-14 NOTE — Telephone Encounter (Signed)
Gastroenterology Pre-Procedure Review   PATIENT REVIEW QUESTIONS: The patient responded to the following health history questions as indicated:    1. Are you having any GI issues? no 2. Do you have a personal history of Polyps? no 3. Do you have a family history of Colon Cancer or Polyps? no 4. Diabetes Mellitus? Yes 5. Joint replacements in the past 12 months?no 6. Major health problems in the past 3 months?no 7. Any artificial heart valves, MVP, or defibrillator?no    MEDICATIONS & ALLERGIES:    Patient reports the following regarding taking any anticoagulation/antiplatelet therapy:   Plavix, Coumadin, Eliquis, Xarelto, Lovenox, Pradaxa, Brilinta, or Effient? no Aspirin? Yes  Patient confirms/reports the following medications:  Current Outpatient Medications  Medication Sig Dispense Refill  . amLODipine (NORVASC) 10 MG tablet Take 1 tablet (10 mg total) by mouth daily. 90 tablet 1  . aspirin 81 MG chewable tablet Chew 1 tablet by mouth daily.    Marland Kitchen atorvastatin (LIPITOR) 40 MG tablet Take 1 tablet (40 mg total) by mouth daily. 90 tablet 1  . glipiZIDE (GLUCOTROL XL) 2.5 MG 24 hr tablet Take 1 tablet (2.5 mg total) by mouth daily with breakfast. 90 tablet 1  . glucose blood test strip Use as instructed 100 each 12  . lisinopril-hydrochlorothiazide (ZESTORETIC) 20-12.5 MG tablet Take 1 tablet by mouth daily. 90 tablet 1  . metFORMIN (GLUCOPHAGE) 1000 MG tablet Take 1 tablet (1,000 mg total) by mouth 2 (two) times daily. 180 tablet 1  . metoprolol tartrate (LOPRESSOR) 50 MG tablet TAKE 1 TABLET BY MOUTH TWICE A DAY 180 tablet 0  . venlafaxine XR (EFFEXOR-XR) 150 MG 24 hr capsule Take 1 capsule (150 mg total) by mouth daily with breakfast. 90 capsule 1   No current facility-administered medications for this visit.    Patient confirms/reports the following allergies:  No Known Allergies  No orders of the defined types were placed in this encounter.   AUTHORIZATION  INFORMATION Primary Insurance: 1D#: Group #:  Secondary Insurance: 1D#: Group #:  SCHEDULE INFORMATION: Date: 07/13/2019 Time: Location: [place]

## 2019-05-19 ENCOUNTER — Ambulatory Visit: Payer: PPO | Attending: Internal Medicine

## 2019-05-19 DIAGNOSIS — Z23 Encounter for immunization: Secondary | ICD-10-CM

## 2019-05-19 NOTE — Progress Notes (Signed)
   Covid-19 Vaccination Clinic  Name:  Andrew [name]    MRN: [id number] DOB: 1951-03-16  05/19/2019  [name] was observed post Covid-19 immunization for 15 minutes without incidence. He was provided with Vaccine Information Sheet and instruction to access the V-Safe system.   [name] was instructed to call 911 with any severe reactions post vaccine: Marland Kitchen Difficulty breathing  . Swelling of your face and throat  . A fast heartbeat  . A bad rash all over your body  . Dizziness and weakness    Immunizations Administered    Name Date Dose VIS Date Route   Pfizer COVID-19 Vaccine 05/19/2019 12:58 PM 0.3 mL 03/09/2019 Intramuscular   Manufacturer: [place]   Lot: [id number]   [place]: [id number]

## 2019-06-09 ENCOUNTER — Ambulatory Visit: Payer: PPO | Attending: Internal Medicine

## 2019-06-09 DIAGNOSIS — Z23 Encounter for immunization: Secondary | ICD-10-CM

## 2019-06-09 NOTE — Progress Notes (Signed)
   Covid-19 Vaccination Clinic  Name:  Andrew [name]    MRN: [id number] DOB: 1951-03-19  06/09/2019  Andrew [name] was observed post Covid-19 immunization for 15 minutes without incident. He was provided with Vaccine Information Sheet and instruction to access the V-Safe system.   Andrew [name] was instructed to call 911 with any severe reactions post vaccine: Marland Kitchen Difficulty breathing  . Swelling of face and throat  . A fast heartbeat  . A bad rash all over body  . Dizziness and weakness   Immunizations Administered    Name Date Dose VIS Date Route   Pfizer COVID-19 Vaccine 06/09/2019  8:08 AM 0.3 mL 03/09/2019 Intramuscular   Manufacturer: [place]   Lot: [id number]   [place]: [id number]

## 2019-06-12 ENCOUNTER — Ambulatory Visit: Payer: PPO

## 2019-06-26 ENCOUNTER — Telehealth: Payer: Self-pay

## 2019-06-26 NOTE — Telephone Encounter (Signed)
LVM for pt to return my call.

## 2019-06-26 NOTE — Telephone Encounter (Signed)
Has medication questions concerning his upcoming procedures. Please call

## 2019-06-27 ENCOUNTER — Other Ambulatory Visit: Payer: Self-pay

## 2019-06-27 MED ORDER — SUPREP BOWEL PREP KIT 17.5-3.13-1.6 GM/177ML PO SOLN: 1.0000 | Freq: Every day | ORAL | 0 refills | Status: DC

## 2019-06-27 NOTE — Telephone Encounter (Signed)
Pt requested suprep be resent to his pharmacy.

## 2019-06-28 ENCOUNTER — Ambulatory Visit (INDEPENDENT_AMBULATORY_CARE_PROVIDER_SITE_OTHER): Payer: PPO | Admitting: Family Medicine

## 2019-06-28 ENCOUNTER — Other Ambulatory Visit: Payer: Self-pay

## 2019-06-28 ENCOUNTER — Encounter: Payer: Self-pay | Admitting: Family Medicine

## 2019-06-28 VITALS — BP 138/78 | HR 84 | Ht 73.0 in | Wt 184.0 lb

## 2019-06-28 DIAGNOSIS — I10 Essential (primary) hypertension: Secondary | ICD-10-CM | POA: Diagnosis not present

## 2019-06-28 DIAGNOSIS — E119 Type 2 diabetes mellitus without complications: Secondary | ICD-10-CM | POA: Diagnosis not present

## 2019-06-28 DIAGNOSIS — F339 Major depressive disorder, recurrent, unspecified: Secondary | ICD-10-CM | POA: Diagnosis not present

## 2019-06-28 DIAGNOSIS — E7801 Familial hypercholesterolemia: Secondary | ICD-10-CM

## 2019-06-28 DIAGNOSIS — N401 Enlarged prostate with lower urinary tract symptoms: Secondary | ICD-10-CM

## 2019-06-28 DIAGNOSIS — R351 Nocturia: Secondary | ICD-10-CM

## 2019-06-28 MED ORDER — GLIPIZIDE ER 2.5 MG PO TB24: 2.5000 mg | ORAL_TABLET | Freq: Every day | ORAL | 1 refills | Status: DC

## 2019-06-28 MED ORDER — VENLAFAXINE HCL ER 150 MG PO CP24: 150.0000 mg | ORAL_CAPSULE | Freq: Every day | ORAL | 1 refills | Status: DC

## 2019-06-28 MED ORDER — AMLODIPINE BESYLATE 10 MG PO TABS: 10.0000 mg | ORAL_TABLET | Freq: Every day | ORAL | 1 refills | Status: DC

## 2019-06-28 MED ORDER — ATORVASTATIN CALCIUM 40 MG PO TABS: 40.0000 mg | ORAL_TABLET | Freq: Every day | ORAL | 1 refills | Status: DC

## 2019-06-28 MED ORDER — LISINOPRIL-HYDROCHLOROTHIAZIDE 20-12.5 MG PO TABS: 1.0000 | ORAL_TABLET | Freq: Every day | ORAL | 1 refills | Status: DC

## 2019-06-28 MED ORDER — METFORMIN HCL 1000 MG PO TABS: 1000.0000 mg | ORAL_TABLET | Freq: Two times a day (BID) | ORAL | 1 refills | Status: DC

## 2019-06-28 MED ORDER — METOPROLOL TARTRATE 50 MG PO TABS: 50.0000 mg | ORAL_TABLET | Freq: Two times a day (BID) | ORAL | 1 refills | Status: DC

## 2019-06-28 NOTE — Progress Notes (Signed)
Date:  06/28/2019   Name:  Andrew [name]   DOB:  1950-12-09   MRN:  [id number]   Chief Complaint: Diabetes, Depression (PHQ9=1 and GAD=0), Hypertension, and Hyperlipidemia  Diabetes He presents for his follow-up diabetic visit. He has type 2 diabetes mellitus. His disease course has been stable. There are no hypoglycemic associated symptoms. Pertinent negatives for hypoglycemia include no dizziness, headaches, nervousness/anxiousness or sweats. There are no diabetic associated symptoms. Pertinent negatives for diabetes include no blurred vision, no chest pain, no fatigue, no foot paresthesias, no foot ulcerations, no polydipsia, no polyphagia, no polyuria, no visual change, no weakness and no weight loss. There are no hypoglycemic complications. Symptoms are stable. There are no diabetic complications. Pertinent negatives for diabetic complications include no CVA, PVD or retinopathy. Risk factors for coronary artery disease include dyslipidemia, hypertension and male sex. Current diabetic treatment includes oral agent (dual therapy). He is compliant with treatment all of the time. His weight is stable. He is following a generally healthy diet. Meal planning includes avoidance of concentrated sweets and carbohydrate counting. There is no change in his home blood glucose trend. His breakfast blood glucose is taken between 7-8 am. His breakfast blood glucose range is generally 110-130 mg/dl. An ACE inhibitor/angiotensin II receptor blocker is being taken. He does not see a podiatrist.Eye exam is not current.  Depression        This is a chronic problem.  The current episode started more than 1 year ago.   The onset quality is gradual.   The problem has been gradually improving since onset.  Associated symptoms include insomnia.  Associated symptoms include no decreased concentration, no fatigue, no helplessness, no hopelessness, not irritable, no restlessness, no decreased interest, no appetite change, no  body aches, no myalgias, no headaches, no indigestion, not sad and no suicidal ideas.     The symptoms are aggravated by medication.  Past treatments include SNRIs - Serotonin and norepinephrine reuptake inhibitors.  Compliance with treatment is variable.   Pertinent negatives include no hypothyroidism and no anxiety. Hypertension This is a chronic problem. The current episode started more than 1 year ago. The problem is controlled. Pertinent negatives include no anxiety, blurred vision, chest pain, headaches, malaise/fatigue, neck pain, orthopnea, palpitations, peripheral edema, PND, shortness of breath or sweats. Risk factors for coronary artery disease include diabetes mellitus and dyslipidemia. Past treatments include ACE inhibitors, calcium channel blockers, beta blockers and diuretics. The current treatment provides mild improvement. There are no compliance problems.  There is no history of angina, kidney disease, CAD/MI, CVA, heart failure, left ventricular hypertrophy, PVD or retinopathy. There is no history of chronic renal disease, a hypertension causing med or renovascular disease.  Hyperlipidemia This is a chronic problem. The current episode started more than 1 year ago. The problem is controlled. Recent lipid tests were reviewed and are normal. He has no history of chronic renal disease, diabetes, hypothyroidism, liver disease, obesity or nephrotic syndrome. Pertinent negatives include no chest pain, myalgias or shortness of breath.    Lab Results  Component Value Date   CREATININE 0.94 01/31/2019   BUN 23 01/31/2019   NA 143 01/31/2019   K 5.1 01/31/2019   CL 99 01/31/2019   CO2 25 01/31/2019   Lab Results  Component Value Date   CHOL 121 01/31/2019   HDL 48 01/31/2019   LDLCALC 59 01/31/2019   TRIG 70 01/31/2019   CHOLHDL 3.3 07/31/2018   No results found for: TSH Lab  Results  Component Value Date   HGBA1C 5.5 01/31/2019   Lab Results  Component Value Date   WBC 10.8  (H) 12/22/2018   HGB 14.9 12/22/2018   HCT 43.6 12/22/2018   MCV 96.7 12/22/2018   PLT 293 12/22/2018   No results found for: ALT, AST, GGT, ALKPHOS, BILITOT   Review of Systems  Constitutional: Negative for appetite change, chills, fatigue, fever, malaise/fatigue and weight loss.  HENT: Negative for drooling, ear discharge, ear pain and sore throat.   Eyes: Negative for blurred vision.  Respiratory: Negative for cough, shortness of breath and wheezing.   Cardiovascular: Negative for chest pain, palpitations, orthopnea, leg swelling and PND.  Gastrointestinal: Negative for abdominal pain, blood in stool, constipation, diarrhea and nausea.  Endocrine: Negative for polydipsia, polyphagia and polyuria.  Genitourinary: Negative for dysuria, frequency, hematuria and urgency.  Musculoskeletal: Negative for back pain, myalgias and neck pain.  Skin: Negative for rash.  Allergic/Immunologic: Negative for environmental allergies.  Neurological: Negative for dizziness, weakness and headaches.  Hematological: Does not bruise/bleed easily.  Psychiatric/Behavioral: Positive for depression. Negative for decreased concentration and suicidal ideas. The patient has insomnia. The patient is not nervous/anxious.     Patient Active Problem List   Diagnosis Date Noted  . Aortic dissection proximal to innominate ([place]) 12/23/2018  . Mixed hyperlipidemia 10/05/2016  . Familial multiple lipoprotein-type hyperlipidemia 08/02/2014  . Routine general medical examination at a health care facility 08/02/2014  . Recurrent major depressive episodes ([place]) 08/02/2014  . Essential (primary) hypertension 08/02/2014  . Overweight 08/02/2014  . Diabetes mellitus, type 2 ([place]) 08/02/2014    No Known Allergies  Past Surgical History:  Procedure Laterality Date  . NASAL SINUS SURGERY    . REPAIR OF ACUTE ASCENDING THORACIC AORTIC DISSECTION  12/2018    Social History   Tobacco Use  . Smoking status: Never  Smoker  . Smokeless tobacco: Never Used  . Tobacco comment: smoking cessation materials not required  Substance Use Topics  . Alcohol use: Yes    Alcohol/week: 1.0 standard drinks    Types: 1 Glasses of wine per week    Comment: weekly  . Drug use: No     Medication list has been reviewed and updated.  Current Meds  Medication Sig  . amLODipine (NORVASC) 10 MG tablet Take 1 tablet (10 mg total) by mouth daily.  Marland Kitchen aspirin 81 MG chewable tablet Chew 1 tablet by mouth daily.  Marland Kitchen atorvastatin (LIPITOR) 40 MG tablet Take 1 tablet (40 mg total) by mouth daily.  Marland Kitchen glipiZIDE (GLUCOTROL XL) 2.5 MG 24 hr tablet Take 1 tablet (2.5 mg total) by mouth daily with breakfast.  . glucose blood test strip Use as instructed  . lisinopril-hydrochlorothiazide (ZESTORETIC) 20-12.5 MG tablet Take 1 tablet by mouth daily.  . metFORMIN (GLUCOPHAGE) 1000 MG tablet Take 1 tablet (1,000 mg total) by mouth 2 (two) times daily.  . metoprolol tartrate (LOPRESSOR) 50 MG tablet TAKE 1 TABLET BY MOUTH TWICE A DAY  . venlafaxine XR (EFFEXOR-XR) 150 MG 24 hr capsule Take 1 capsule (150 mg total) by mouth daily with breakfast.    PHQ 2/9 Scores 06/28/2019 05/07/2019 01/31/2019 07/31/2018  PHQ - 2 Score 0 0 0 0  PHQ- 9 Score 1 - 1 0    BP Readings from Last 3 Encounters:  06/28/19 138/78  05/07/19 139/77  01/31/19 130/76    Physical Exam Vitals and nursing note reviewed.  Constitutional:      General: He is not  irritable. HENT:     Head: Normocephalic.     Right Ear: Tympanic membrane, ear canal and external ear normal.     Left Ear: Tympanic membrane, ear canal and external ear normal.     Nose: Nose normal. No congestion or rhinorrhea.     Mouth/Throat:     Mouth: Mucous membranes are moist.  Eyes:     General: No scleral icterus.       Right eye: No discharge.        Left eye: No discharge.     Conjunctiva/sclera: Conjunctivae normal.     Pupils: Pupils are equal, round, and reactive to light.  Neck:      Thyroid: No thyromegaly.     Vascular: No carotid bruit or JVD.     Trachea: No tracheal deviation.  Cardiovascular:     Rate and Rhythm: Normal rate and regular rhythm.     Heart sounds: Normal heart sounds. No murmur. No friction rub. No gallop.   Pulmonary:     Effort: No respiratory distress.     Breath sounds: Normal breath sounds. No wheezing, rhonchi or rales.  Abdominal:     General: Bowel sounds are normal.     Palpations: Abdomen is soft. There is no mass.     Tenderness: There is no abdominal tenderness. There is no guarding or rebound.  Genitourinary:    Prostate: Enlarged. Not tender and no nodules present.     Rectum: Normal. Guaiac result negative. No mass.  Musculoskeletal:        General: No tenderness. Normal range of motion.     Cervical back: Normal range of motion and neck supple.  Lymphadenopathy:     Cervical: No cervical adenopathy.  Skin:    General: Skin is warm.     Findings: No rash.  Neurological:     Mental Status: He is alert and oriented to person, place, and time.     Cranial Nerves: No cranial nerve deficit.     Deep Tendon Reflexes: Reflexes are normal and symmetric.     Wt Readings from Last 3 Encounters:  06/28/19 184 lb (83.5 kg)  05/07/19 176 lb (79.8 kg)  01/31/19 176 lb (79.8 kg)    BP 138/78   Pulse 84   Ht 6\' 1"  (1.854 m)   Wt 184 lb (83.5 kg)   BMI 24.28 kg/m   Assessment and Plan: 1. Essential (primary) hypertension Chronic.  Controlled.  Stable.  Uncomplicated we will continue metoprolol 50 mg 1 twice a day and lisinopril hydrochlorothiazide 20-12 0.5 once a day, as well as amlodipine 10 mg once a day.  Will check renal function panel to assess GFR. - metoprolol tartrate (LOPRESSOR) 50 MG tablet; Take 1 tablet (50 mg total) by mouth 2 (two) times daily.  Dispense: 180 tablet; Refill: 1 - lisinopril-hydrochlorothiazide (ZESTORETIC) 20-12.5 MG tablet; Take 1 tablet by mouth daily.  Dispense: 90 tablet; Refill: 1 -  amLODipine (NORVASC) 10 MG tablet; Take 1 tablet (10 mg total) by mouth daily.  Dispense: 90 tablet; Refill: 1 - Renal Function Panel  - atorvastatin (LIPITOR) 40 MG tab 2. Familial hypercholesterolemia .  Controlled.  Stable.  Continue  of atorvastatin 40 mg once a daylet; Take 1 tablet (40 mg total) by mouth daily.  Will check lipid panel to assess current level.  Dispense: 90 tablet; Refill: 1 - Lipid Panel With LDL/HDL Ratio  3. Recurrent major depressive episodes ([place]) .  Controlled.  Stable.  PHQ was  1 with insomnia.  Patient will continue with Effexor Exar 150 mg 1 a day. - venlafaxine XR (EFFEXOR-XR) 150 MG 24 hr capsule; Take 1 capsule (150 mg total) by mouth daily with breakfast.  Dispense: 90 capsule; Refill: 1  4. Type 2 diabetes mellitus without complication, without long-term current use of insulin (HCC) Chronic.  Controlled.  Stable.  Continuance of Metformin 1 g twice a day, and glipizide XL 2.5 mg once a day will check renal function panel.  As well as A1c and microalbuminuria. - Microalbumin, urine - Hemoglobin A1c - metFORMIN (GLUCOPHAGE) 1000 MG tablet; Take 1 tablet (1,000 mg total) by mouth 2 (two) times daily.  Dispense: 180 tablet; Refill: 1 - glipiZIDE (GLUCOTROL XL) 2.5 MG 24 hr tablet; Take 1 tablet (2.5 mg total) by mouth daily with breakfast.  Dispense: 90 tablet; Refill: 1 - Renal Function Panel  5. Benign prostatic hyperplasia with nocturia Patient used to see Dr. Eliberto Ivory but it has been 3 years.  DRE notes to have an enlarged prostate which is somewhat soft without tenderness as well as no nodularity.  This is consistent with BPH patient was offered what sounds like tamsulosin but preferred not to start it at this time but with continuance of nocturia we will reassess his PSA and if elevated decide whether we will do urology consult or medication approach. - PSA

## 2019-06-29 LAB — RENAL FUNCTION PANEL
Albumin: 4.9 g/dL — ABNORMAL HIGH (ref 3.8–4.8)
BUN/Creatinine Ratio: 24 (ref 10–24)
BUN: 22 mg/dL (ref 8–27)
CO2: 26 mmol/L (ref 20–29)
Calcium: 10.3 mg/dL — ABNORMAL HIGH (ref 8.6–10.2)
Chloride: 101 mmol/L (ref 96–106)
Creatinine, Ser: 0.9 mg/dL (ref 0.76–1.27)
GFR calc Af Amer: 101 mL/min/{1.73_m2} (ref >59)
GFR calc non Af Amer: 87 mL/min/{1.73_m2} (ref >59)
Glucose: 95 mg/dL (ref 65–99)
Phosphorus: 3.8 mg/dL (ref 2.8–4.1)
Potassium: 4.1 mmol/L (ref 3.5–5.2)
Sodium: 142 mmol/L (ref 134–144)

## 2019-06-29 LAB — PSA: Prostate Specific Ag, Serum: 1.8 ng/mL (ref 0.0–4.0)

## 2019-06-29 LAB — LIPID PANEL WITH LDL/HDL RATIO
Cholesterol, Total: 144 mg/dL (ref 100–199)
HDL: 57 mg/dL (ref >39)
LDL Chol Calc (NIH): 66 mg/dL (ref 0–99)
LDL/HDL Ratio: 1.2 ratio (ref 0.0–3.6)
Triglycerides: 116 mg/dL (ref 0–149)
VLDL Cholesterol Cal: 21 mg/dL (ref 5–40)

## 2019-06-29 LAB — HEMOGLOBIN A1C
Est. average glucose Bld gHb Est-mCnc: 123 mg/dL
Hgb A1c MFr Bld: 5.9 % — ABNORMAL HIGH (ref 4.8–5.6)

## 2019-06-29 LAB — MICROALBUMIN, URINE: Microalbumin, Urine: 721.6 ug/mL

## 2019-07-11 ENCOUNTER — Other Ambulatory Visit
Admission: RE | Admit: 2019-07-11 | Discharge: 2019-07-11 | Disposition: A | Discharge status: Home / Self Care | Payer: PPO | Source: Ambulatory Visit | Attending: Gastroenterology | Admitting: Gastroenterology

## 2019-07-11 ENCOUNTER — Other Ambulatory Visit: Payer: Self-pay

## 2019-07-11 DIAGNOSIS — Z20822 Contact with and (suspected) exposure to covid-19: Secondary | ICD-10-CM | POA: Insufficient documentation

## 2019-07-11 DIAGNOSIS — Z01812 Encounter for preprocedural laboratory examination: Secondary | ICD-10-CM | POA: Insufficient documentation

## 2019-07-11 LAB — SARS CORONAVIRUS 2 (TAT 6-24 HRS): SARS Coronavirus 2: NEGATIVE

## 2019-07-13 ENCOUNTER — Ambulatory Visit: Payer: PPO | Admitting: Certified Registered Nurse Anesthetist

## 2019-07-13 ENCOUNTER — Ambulatory Visit
Admission: RE | Admit: 2019-07-13 | Discharge: 2019-07-13 | Disposition: A | Discharge status: Home / Self Care | Payer: PPO | Attending: Gastroenterology | Admitting: Gastroenterology

## 2019-07-13 ENCOUNTER — Encounter: Payer: Self-pay | Admitting: Gastroenterology

## 2019-07-13 ENCOUNTER — Encounter
Admission: RE | Disposition: A | Discharge status: Home / Self Care | Payer: Self-pay | Source: Home / Self Care | Attending: Gastroenterology

## 2019-07-13 ENCOUNTER — Other Ambulatory Visit: Payer: Self-pay

## 2019-07-13 DIAGNOSIS — K5730 Diverticulosis of large intestine without perforation or abscess without bleeding: Secondary | ICD-10-CM | POA: Diagnosis not present

## 2019-07-13 DIAGNOSIS — E119 Type 2 diabetes mellitus without complications: Secondary | ICD-10-CM | POA: Diagnosis not present

## 2019-07-13 DIAGNOSIS — Z7982 Long term (current) use of aspirin: Secondary | ICD-10-CM | POA: Insufficient documentation

## 2019-07-13 DIAGNOSIS — Z87442 Personal history of urinary calculi: Secondary | ICD-10-CM | POA: Diagnosis not present

## 2019-07-13 DIAGNOSIS — I10 Essential (primary) hypertension: Secondary | ICD-10-CM | POA: Diagnosis not present

## 2019-07-13 DIAGNOSIS — Z1211 Encounter for screening for malignant neoplasm of colon: Secondary | ICD-10-CM

## 2019-07-13 DIAGNOSIS — F329 Major depressive disorder, single episode, unspecified: Secondary | ICD-10-CM | POA: Diagnosis not present

## 2019-07-13 DIAGNOSIS — Z7984 Long term (current) use of oral hypoglycemic drugs: Secondary | ICD-10-CM | POA: Insufficient documentation

## 2019-07-13 DIAGNOSIS — Z79899 Other long term (current) drug therapy: Secondary | ICD-10-CM | POA: Diagnosis not present

## 2019-07-13 DIAGNOSIS — E785 Hyperlipidemia, unspecified: Secondary | ICD-10-CM | POA: Diagnosis not present

## 2019-07-13 DIAGNOSIS — K635 Polyp of colon: Secondary | ICD-10-CM | POA: Diagnosis not present

## 2019-07-13 DIAGNOSIS — K641 Second degree hemorrhoids: Secondary | ICD-10-CM | POA: Diagnosis not present

## 2019-07-13 DIAGNOSIS — E782 Mixed hyperlipidemia: Secondary | ICD-10-CM | POA: Diagnosis not present

## 2019-07-13 DIAGNOSIS — K5790 Diverticulosis of intestine, part unspecified, without perforation or abscess without bleeding: Secondary | ICD-10-CM | POA: Diagnosis not present

## 2019-07-13 HISTORY — PX: COLONOSCOPY WITH PROPOFOL: SHX5780

## 2019-07-13 LAB — GLUCOSE, CAPILLARY: Glucose-Capillary: 116 mg/dL — ABNORMAL HIGH (ref 70–99)

## 2019-07-13 SURGERY — COLONOSCOPY WITH PROPOFOL
Anesthesia: General

## 2019-07-13 MED ORDER — PROPOFOL 500 MG/50ML IV EMUL
INTRAVENOUS | Status: DC | PRN
  Administered 2019-07-13: 150 ug/kg/min via INTRAVENOUS

## 2019-07-13 MED ORDER — SODIUM CHLORIDE 0.9 % IV SOLN: INTRAVENOUS | Status: DC

## 2019-07-13 MED ORDER — PROPOFOL 500 MG/50ML IV EMUL
INTRAVENOUS | Status: AC
  Filled 2019-07-13: qty 50

## 2019-07-13 MED ORDER — PROPOFOL 10 MG/ML IV BOLUS
INTRAVENOUS | Status: DC | PRN
  Administered 2019-07-13 (×2): 50 mg via INTRAVENOUS

## 2019-07-13 MED ORDER — LIDOCAINE HCL (PF) 2 % IJ SOLN
INTRAMUSCULAR | Status: AC
  Filled 2019-07-13: qty 5

## 2019-07-13 MED ORDER — LIDOCAINE HCL (CARDIAC) PF 100 MG/5ML IV SOSY
PREFILLED_SYRINGE | INTRAVENOUS | Status: DC | PRN
  Administered 2019-07-13: 50 mg via INTRAVENOUS

## 2019-07-13 NOTE — Anesthesia Postprocedure Evaluation (Signed)
Anesthesia Post Note  Patient: Andrew [name]  Procedure(s) Performed: COLONOSCOPY WITH PROPOFOL (N/A )  Patient location during evaluation: Endoscopy Anesthesia Type: General Level of consciousness: awake and alert Pain management: pain level controlled Vital Signs Assessment: post-procedure vital signs reviewed and stable Respiratory status: spontaneous breathing and respiratory function stable Cardiovascular status: stable Anesthetic complications: no     Last Vitals:  Vitals:   07/13/19 0828 07/13/19 0838  BP: (!) 125/99 (!) 151/91  Pulse: 65 (!) 50  Resp: 16 (!) 24  Temp:    SpO2: 99% 97%    Last Pain:  Vitals:   07/13/19 0838  TempSrc:   PainSc: 0-No pain                 Meghann Landing K

## 2019-07-13 NOTE — Anesthesia Preprocedure Evaluation (Signed)
Anesthesia Evaluation  Patient identified by MRN, date of birth, ID band Patient awake    Reviewed: Allergy & Precautions, NPO status , Patient's Chart, lab work & pertinent test results  History of Anesthesia Complications Negative for: history of anesthetic complications  Airway Mallampati: II       Dental   Pulmonary neg sleep apnea, neg COPD, Not current smoker,           Cardiovascular hypertension, Pt. on medications + Valvular Problems/Murmurs (s/p aortic valve repair)      Neuro/Psych neg Seizures Depression    GI/Hepatic Neg liver ROS, neg GERD  ,  Endo/Other  diabetes, Type 2, Oral Hypoglycemic Agents  Renal/GU Renal disease (stones)     Musculoskeletal   Abdominal   Peds  Hematology   Anesthesia Other Findings   Reproductive/Obstetrics                             Anesthesia Physical Anesthesia Plan  ASA: III  Anesthesia Plan: General   Post-op Pain Management:    Induction: Intravenous  PONV Risk Score and Plan: 2 and Propofol infusion and TIVA  Airway Management Planned: Nasal Cannula  Additional Equipment:   Intra-op Plan:   Post-operative Plan:   Informed Consent: I have reviewed the patients History and Physical, chart, labs and discussed the procedure including the risks, benefits and alternatives for the proposed anesthesia with the patient or authorized representative who has indicated his/her understanding and acceptance.       Plan Discussed with:   Anesthesia Plan Comments:         Anesthesia Quick Evaluation

## 2019-07-13 NOTE — H&P (Signed)
Andrew Lame, MD [facility] [address] [place], [place] [address] Phone: [phone] Fax : [phone]  Primary Care Physician:  Juline Patch, MD Primary Gastroenterologist:  Dr. Allen Norris  Pre-Procedure History & Physical: HPI:  Andrew [name] is a 69 y.o. male is here for a screening colonoscopy.   Past Medical History:  Diagnosis Date  . Depression   . Diabetes mellitus without complication ([place])   . Hyperlipidemia   . Hypertension     Past Surgical History:  Procedure Laterality Date  . NASAL SINUS SURGERY    . REPAIR OF ACUTE ASCENDING THORACIC AORTIC DISSECTION  12/2018    Prior to Admission medications   Medication Sig Start Date End Date Taking? Authorizing Provider  amLODipine (NORVASC) 10 MG tablet Take 1 tablet (10 mg total) by mouth daily. 06/28/19  Yes Juline Patch, MD  aspirin 81 MG chewable tablet Chew 1 tablet by mouth daily. 12/28/18  Yes [provider]  atorvastatin (LIPITOR) 40 MG tablet Take 1 tablet (40 mg total) by mouth daily. 06/28/19  Yes Juline Patch, MD  glipiZIDE (GLUCOTROL XL) 2.5 MG 24 hr tablet Take 1 tablet (2.5 mg total) by mouth daily with breakfast. 06/28/19  Yes [name] C, MD  glucose blood test strip Use as instructed 08/01/18  Yes Juline Patch, MD  lisinopril-hydrochlorothiazide (ZESTORETIC) 20-12.5 MG tablet Take 1 tablet by mouth daily. 06/28/19  Yes Juline Patch, MD  metFORMIN (GLUCOPHAGE) 1000 MG tablet Take 1 tablet (1,000 mg total) by mouth 2 (two) times daily. 06/28/19  Yes Juline Patch, MD  metoprolol tartrate (LOPRESSOR) 50 MG tablet Take 1 tablet (50 mg total) by mouth 2 (two) times daily. 06/28/19  Yes Juline Patch, MD  venlafaxine XR (EFFEXOR-XR) 150 MG 24 hr capsule Take 1 capsule (150 mg total) by mouth daily with breakfast. 06/28/19  Yes Juline Patch, MD  SUPREP BOWEL PREP KIT 17.5-3.13-1.6 GM/177ML SOLN Take 1 kit by mouth daily. Patient not taking: Reported on 06/28/2019 06/27/19   Andrew Lame, MD     Allergies as of 05/14/2019  . (No Known Allergies)    Family History  Problem Relation Age of Onset  . Heart disease Mother   . Healthy Father     Social History   Socioeconomic History  . Marital status: Married    Spouse name: Not on file  . Number of children: 1  . Years of education: Not on file  . Highest education level: Associate degree: academic program  Occupational History  . Occupation: Retired  Tobacco Use  . Smoking status: Never Smoker  . Smokeless tobacco: Never Used  . Tobacco comment: smoking cessation materials not required  Substance and Sexual Activity  . Alcohol use: Yes    Alcohol/week: 5.0 standard drinks    Types: 2 Glasses of wine, 3 Cans of beer per week    Comment: weekly  . Drug use: No  . Sexual activity: Not Currently  Other Topics Concern  . Not on file  Social History Narrative  . Not on file   Social Determinants of Health   Financial Resource Strain: Low Risk   . Difficulty of Paying Living Expenses: Not hard at all  Food Insecurity: No Food Insecurity  . Worried About Charity fundraiser in the Last Year: Never true  . Ran Out of Food in the Last Year: Never true  Transportation Needs: No Transportation Needs  . Lack of Transportation (Medical): No  .  Lack of Transportation (Non-Medical): No  Physical Activity: Sufficiently Active  . Days of Exercise per Week: 7 days  . Minutes of Exercise per Session: 40 min  Stress: No Stress Concern Present  . Feeling of Stress : Only a little  Social Connections: Somewhat Isolated  . Frequency of Communication with Friends and Family: More than three times a week  . Frequency of Social Gatherings with Friends and Family: Twice a week  . Attends Religious Services: Never  . Active Member of Clubs or Organizations: No  . Attends Archivist Meetings: Never  . Marital Status: Married  Human resources officer Violence: Not At Risk  . Fear of Current or Ex-Partner: No  . Emotionally  Abused: No  . Physically Abused: No  . Sexually Abused: No    Review of Systems: See HPI, otherwise negative ROS  Physical Exam: BP (!) 128/95   Temp (!) 97 F (36.1 C) (Temporal)   Resp 16   Ht '6\' 1"'$  (1.854 m)   Wt 82.6 kg   SpO2 98%   BMI 24.01 kg/m  General:   Alert,  pleasant and cooperative in NAD Head:  Normocephalic and atraumatic. Neck:  Supple; no masses or thyromegaly. Lungs:  Clear throughout to auscultation.    Heart:  Regular rate and rhythm. Abdomen:  Soft, nontender and nondistended. Normal bowel sounds, without guarding, and without rebound.   Neurologic:  Alert and  oriented x4;  grossly normal neurologically.  Impression/Plan: Andrew [name] is now here to undergo a screening colonoscopy.  Risks, benefits, and alternatives regarding colonoscopy have been reviewed with the patient.  Questions have been answered.  All parties agreeable.

## 2019-07-13 NOTE — Transfer of Care (Signed)
Immediate Anesthesia Transfer of Care Note  Patient: Andrew [name]  Procedure(s) Performed: COLONOSCOPY WITH PROPOFOL (N/A )  Patient Location: PACU  Anesthesia Type:General  Level of Consciousness: awake, alert  and oriented  Airway & Oxygen Therapy: Patient Spontanous Breathing  Post-op Assessment: Report given to RN and Post -op Vital signs reviewed and stable  Post vital signs: Reviewed and stable  Last Vitals:  Vitals Value Taken Time  BP 130/87 07/13/19 0820  Temp    Pulse 77 07/13/19 0820  Resp 16 07/13/19 0820  SpO2 97 % 07/13/19 0820  Vitals shown include unvalidated device data.  Last Pain:  Vitals:   07/13/19 0716  TempSrc: Temporal  PainSc: 0-No pain         Complications: No apparent anesthesia complications

## 2019-07-13 NOTE — Op Note (Signed)
[facility] Patient Name: Andrew [name] Procedure Date: 07/13/2019 7:44 AM MRN: [id number] Account #: [id number] Date of Birth: [birth year] Admit Type: Outpatient Age: 69 Room: [facility] ENDO ROOM 4 Gender: Male Note Status: Finalized Procedure:             Colonoscopy Indications:           Screening for colorectal malignant neoplasm Providers:             [name] MD, MD Referring MD:          Juline Patch, MD (Referring MD) Medicines:             Propofol per Anesthesia Complications:         No immediate complications. Procedure:             Pre-Anesthesia Assessment:                        - Prior to the procedure, a History and Physical was                         performed, and patient medications and allergies were                         reviewed. The patient's tolerance of previous                         anesthesia was also reviewed. The risks and benefits                         of the procedure and the sedation options and risks                         were discussed with the patient. All questions were                         answered, and informed consent was obtained. Prior                         Anticoagulants: The patient has taken no previous                         anticoagulant or antiplatelet agents. ASA Grade                         Assessment: II - A patient with mild systemic disease.                         After reviewing the risks and benefits, the patient                         was deemed in satisfactory condition to undergo the                         procedure.                        After obtaining informed consent, the colonoscope was  passed under direct vision. Throughout the procedure,                         the patient's blood pressure, pulse, and oxygen                         saturations were monitored continuously. The                         Colonoscope was introduced through the  anus and                         advanced to the the cecum, identified by appendiceal                         orifice and ileocecal valve. The colonoscopy was                         performed without difficulty. The patient tolerated                         the procedure well. The quality of the bowel                         preparation was good. Findings:      The perianal and digital rectal examinations were normal.      Thickened IC valve, Biopsies were taken with a cold forceps for       histology.      Multiple small-mouthed diverticula were found in the entire colon.      Non-bleeding internal hemorrhoids were found during retroflexion. The       hemorrhoids were Grade II (internal hemorrhoids that prolapse but reduce       spontaneously). Impression:            - Diverticulosis in the entire examined colon.                        - Non-bleeding internal hemorrhoids. Recommendation:        - Discharge patient to home.                        - Resume previous diet.                        - Continue present medications.                        - Await pathology results.                        - If the IC valve adenomatous then consider removal. Procedure Code(s):     --- Professional ---                        [id number], Colonoscopy, flexible; with biopsy, single or                         multiple Diagnosis Code(s):     --- Professional ---  Z12.11, Encounter for screening for malignant neoplasm                         of colon CPT copyright 2019 [employer]. All rights reserved. The codes documented in this report are preliminary and upon coder review may  be revised to meet current compliance requirements. [name] MD, MD 07/13/2019 8:17:02 AM This report has been signed electronically. Number of Addenda: 0 Note Initiated On: 07/13/2019 7:44 AM Scope Withdrawal Time: 0 hours 12 minutes 57 seconds  Total Procedure Duration: 0 hours 23  minutes 19 seconds  Estimated Blood Loss:  Estimated blood loss: none.      [facility]

## 2019-07-13 NOTE — Anesthesia Procedure Notes (Signed)
Date/Time: 07/13/2019 7:49 AM Performed by: Johnna Acosta, CRNA Pre-anesthesia Checklist: Patient identified, Emergency Drugs available, Suction available, Patient being monitored and Timeout performed Patient Re-evaluated:Patient Re-evaluated prior to induction Oxygen Delivery Method: Nasal cannula Preoxygenation: Pre-oxygenation with 100% oxygen Induction Type: IV induction

## 2019-07-16 ENCOUNTER — Encounter: Payer: Self-pay | Admitting: *Deleted

## 2019-07-16 LAB — SURGICAL PATHOLOGY

## 2019-07-17 ENCOUNTER — Encounter: Payer: Self-pay | Admitting: Gastroenterology

## 2019-09-20 DIAGNOSIS — E119 Type 2 diabetes mellitus without complications: Secondary | ICD-10-CM | POA: Diagnosis not present

## 2019-09-20 DIAGNOSIS — I10 Essential (primary) hypertension: Secondary | ICD-10-CM | POA: Diagnosis not present

## 2019-09-20 DIAGNOSIS — E785 Hyperlipidemia, unspecified: Secondary | ICD-10-CM | POA: Diagnosis not present

## 2019-09-20 DIAGNOSIS — J9811 Atelectasis: Secondary | ICD-10-CM | POA: Diagnosis not present

## 2019-09-20 DIAGNOSIS — K429 Umbilical hernia without obstruction or gangrene: Secondary | ICD-10-CM | POA: Diagnosis not present

## 2019-09-20 DIAGNOSIS — I7101 Dissection of thoracic aorta: Secondary | ICD-10-CM | POA: Diagnosis not present

## 2019-09-20 DIAGNOSIS — I77811 Abdominal aortic ectasia: Secondary | ICD-10-CM | POA: Diagnosis not present

## 2019-10-29 ENCOUNTER — Other Ambulatory Visit: Payer: Self-pay

## 2019-10-29 ENCOUNTER — Ambulatory Visit (INDEPENDENT_AMBULATORY_CARE_PROVIDER_SITE_OTHER): Payer: PPO | Admitting: Family Medicine

## 2019-10-29 ENCOUNTER — Encounter: Payer: Self-pay | Admitting: Family Medicine

## 2019-10-29 VITALS — BP 130/78 | HR 76 | Ht 73.0 in | Wt 182.0 lb

## 2019-10-29 DIAGNOSIS — K432 Incisional hernia without obstruction or gangrene: Secondary | ICD-10-CM | POA: Diagnosis not present

## 2019-10-29 DIAGNOSIS — I10 Essential (primary) hypertension: Secondary | ICD-10-CM

## 2019-10-29 DIAGNOSIS — F339 Major depressive disorder, recurrent, unspecified: Secondary | ICD-10-CM

## 2019-10-29 DIAGNOSIS — E119 Type 2 diabetes mellitus without complications: Secondary | ICD-10-CM

## 2019-10-29 DIAGNOSIS — E7801 Familial hypercholesterolemia: Secondary | ICD-10-CM

## 2019-10-29 MED ORDER — VENLAFAXINE HCL ER 150 MG PO CP24: 150.0000 mg | ORAL_CAPSULE | Freq: Every day | ORAL | 1 refills | Status: DC

## 2019-10-29 MED ORDER — METOPROLOL TARTRATE 50 MG PO TABS: 50.0000 mg | ORAL_TABLET | Freq: Two times a day (BID) | ORAL | 1 refills | Status: DC

## 2019-10-29 MED ORDER — GLIPIZIDE ER 2.5 MG PO TB24: 2.5000 mg | ORAL_TABLET | Freq: Every day | ORAL | 1 refills | Status: DC

## 2019-10-29 MED ORDER — ATORVASTATIN CALCIUM 40 MG PO TABS: 40.0000 mg | ORAL_TABLET | Freq: Every day | ORAL | 1 refills | Status: DC

## 2019-10-29 MED ORDER — LISINOPRIL-HYDROCHLOROTHIAZIDE 20-12.5 MG PO TABS: 1.0000 | ORAL_TABLET | Freq: Every day | ORAL | 1 refills | Status: DC

## 2019-10-29 MED ORDER — METFORMIN HCL 1000 MG PO TABS: 1000.0000 mg | ORAL_TABLET | Freq: Two times a day (BID) | ORAL | 1 refills | Status: DC

## 2019-10-29 MED ORDER — AMLODIPINE BESYLATE 10 MG PO TABS: 10.0000 mg | ORAL_TABLET | Freq: Every day | ORAL | 1 refills | Status: DC

## 2019-10-29 NOTE — Progress Notes (Signed)
Date:  10/29/2019   Name:  Andrew [name]   DOB:  12-Dec-1950   MRN:  [id number]   Chief Complaint: Hypertension (follow up ), Diabetes (foot exam ), and Depression  Hypertension This is a chronic problem. The current episode started more than 1 year ago. The problem has been gradually improving since onset. The problem is controlled. Pertinent negatives include no anxiety, blurred vision, chest pain, headaches, malaise/fatigue, neck pain, orthopnea, palpitations, peripheral edema, PND, shortness of breath or sweats. There are no associated agents to hypertension. There are no known risk factors for coronary artery disease. Past treatments include ACE inhibitors, calcium channel blockers, beta blockers and diuretics. The current treatment provides moderate improvement. There is no history of angina, kidney disease, CAD/MI, CVA, heart failure, left ventricular hypertrophy, PVD or retinopathy. There is no history of chronic renal disease, a hypertension causing med or renovascular disease.  Diabetes He presents for his follow-up diabetic visit. He has type 2 diabetes mellitus. His disease course has been stable. There are no hypoglycemic associated symptoms. Pertinent negatives for hypoglycemia include no dizziness, headaches, nervousness/anxiousness or sweats. Pertinent negatives for diabetes include no blurred vision, no chest pain, no fatigue, no foot paresthesias, no foot ulcerations, no polydipsia, no polyphagia, no polyuria, no visual change, no weakness and no weight loss. There are no hypoglycemic complications. Symptoms are stable. There are no diabetic complications. Pertinent negatives for diabetic complications include no autonomic neuropathy, CVA, heart disease, impotence, nephropathy, peripheral neuropathy, PVD or retinopathy. Current diabetic treatment includes oral agent (dual therapy). He is compliant with treatment all of the time. His weight is decreasing steadily. He is following a  generally healthy diet. Meal planning includes avoidance of concentrated sweets and calorie counting. He participates in exercise intermittently. His home blood glucose trend is fluctuating minimally. An ACE inhibitor/angiotensin II receptor blocker is being taken. Eye exam is current.  Depression        This is a chronic problem.  The current episode started more than 1 year ago.   The onset quality is gradual.   The problem occurs intermittently.  The problem has been gradually improving since onset.  Associated symptoms include no decreased concentration, no fatigue, no helplessness, no hopelessness, does not have insomnia, not irritable, no restlessness, no decreased interest, no appetite change, no body aches, no myalgias, no headaches, no indigestion, not sad and no suicidal ideas.     The symptoms are aggravated by medication.  Past treatments include SSRIs - Selective serotonin reuptake inhibitors.   Pertinent negatives include no anxiety.   Lab Results  Component Value Date   CREATININE 0.90 06/28/2019   BUN 22 06/28/2019   NA 142 06/28/2019   K 4.1 06/28/2019   CL 101 06/28/2019   CO2 26 06/28/2019   Lab Results  Component Value Date   CHOL 144 06/28/2019   HDL 57 06/28/2019   LDLCALC 66 06/28/2019   TRIG 116 06/28/2019   CHOLHDL 3.3 07/31/2018   No results found for: TSH Lab Results  Component Value Date   HGBA1C 5.9 (H) 06/28/2019   Lab Results  Component Value Date   WBC 10.8 (H) 12/22/2018   HGB 14.9 12/22/2018   HCT 43.6 12/22/2018   MCV 96.7 12/22/2018   PLT 293 12/22/2018   No results found for: ALT, AST, GGT, ALKPHOS, BILITOT   Review of Systems  Constitutional: Negative for appetite change, chills, fatigue, fever, malaise/fatigue and weight loss.  HENT: Negative for drooling, ear discharge,  ear pain and sore throat.   Eyes: Negative for blurred vision.  Respiratory: Negative for cough, shortness of breath and wheezing.   Cardiovascular: Negative for chest  pain, palpitations, orthopnea, leg swelling and PND.  Gastrointestinal: Negative for abdominal pain, blood in stool, constipation, diarrhea and nausea.  Endocrine: Negative for polydipsia, polyphagia and polyuria.  Genitourinary: Negative for dysuria, frequency, hematuria, impotence and urgency.  Musculoskeletal: Negative for back pain, myalgias and neck pain.  Skin: Negative for rash.  Allergic/Immunologic: Negative for environmental allergies.  Neurological: Negative for dizziness, weakness and headaches.  Hematological: Does not bruise/bleed easily.  Psychiatric/Behavioral: Positive for depression. Negative for decreased concentration and suicidal ideas. The patient is not nervous/anxious and does not have insomnia.     Patient Active Problem List   Diagnosis Date Noted  . Screening for colon cancer   . Aortic dissection proximal to innominate ([place]) 12/23/2018  . Mixed hyperlipidemia 10/05/2016  . Familial multiple lipoprotein-type hyperlipidemia 08/02/2014  . Routine general medical examination at a health care facility 08/02/2014  . Recurrent major depressive episodes ([place]) 08/02/2014  . Essential (primary) hypertension 08/02/2014  . Overweight 08/02/2014  . Diabetes mellitus, type 2 ([place]) 08/02/2014    No Known Allergies  Past Surgical History:  Procedure Laterality Date  . COLONOSCOPY WITH PROPOFOL N/A 07/13/2019   Procedure: COLONOSCOPY WITH PROPOFOL;  Surgeon: [name], MD;  Location: [facility] ENDOSCOPY;  Service: Endoscopy;  Laterality: N/A;  . NASAL SINUS SURGERY    . REPAIR OF ACUTE ASCENDING THORACIC AORTIC DISSECTION  12/2018    Social History   Tobacco Use  . Smoking status: Never Smoker  . Smokeless tobacco: Never Used  . Tobacco comment: smoking cessation materials not required  Vaping Use  . Vaping Use: Never used  Substance Use Topics  . Alcohol use: Yes    Alcohol/week: 5.0 standard drinks    Types: 2 Glasses of wine, 3 Cans of beer per week     Comment: weekly  . Drug use: No     Medication list has been reviewed and updated.  Current Meds  Medication Sig  . amLODipine (NORVASC) 10 MG tablet Take 1 tablet (10 mg total) by mouth daily.  Marland Kitchen aspirin 81 MG chewable tablet Chew 1 tablet by mouth daily.  Marland Kitchen atorvastatin (LIPITOR) 40 MG tablet Take 1 tablet (40 mg total) by mouth daily.  Marland Kitchen glipiZIDE (GLUCOTROL XL) 2.5 MG 24 hr tablet Take 1 tablet (2.5 mg total) by mouth daily with breakfast.  . glucose blood test strip Use as instructed  . lisinopril-hydrochlorothiazide (ZESTORETIC) 20-12.5 MG tablet Take 1 tablet by mouth daily.  . metFORMIN (GLUCOPHAGE) 1000 MG tablet Take 1 tablet (1,000 mg total) by mouth 2 (two) times daily.  . metoprolol tartrate (LOPRESSOR) 50 MG tablet Take 1 tablet (50 mg total) by mouth 2 (two) times daily.  Marland Kitchen venlafaxine XR (EFFEXOR-XR) 150 MG 24 hr capsule Take 1 capsule (150 mg total) by mouth daily with breakfast.    PHQ 2/9 Scores 10/29/2019 06/28/2019 05/07/2019 01/31/2019  PHQ - 2 Score 0 0 0 0  PHQ- 9 Score 0 1 - 1    GAD 7 : Generalized Anxiety Score 10/29/2019 06/28/2019 01/31/2019  Nervous, Anxious, on Edge 0 0 0  Control/stop worrying 0 0 0  Worry too much - different things 0 0 0  Trouble relaxing 0 0 0  Restless 0 0 0  Easily annoyed or irritable 0 0 0  Afraid - awful might happen 0 0 0  Total GAD 7 Score 0 0 0  Anxiety Difficulty Not difficult at all - -    BP Readings from Last 3 Encounters:  10/29/19 (!) 130/78  07/13/19 (!) 151/91  06/28/19 138/78    Physical Exam Vitals and nursing note reviewed.  Constitutional:      General: He is not irritable. HENT:     Head: Normocephalic.     Jaw: There is normal jaw occlusion.     Right Ear: Hearing, tympanic membrane, ear canal and external ear normal.     Left Ear: Hearing, tympanic membrane, ear canal and external ear normal.     Nose: Nose normal.     Mouth/Throat:     Lips: Pink.     Mouth: Mucous membranes are moist.  Eyes:      General: No scleral icterus.       Right eye: No discharge.        Left eye: No discharge.     Conjunctiva/sclera: Conjunctivae normal.     Pupils: Pupils are equal, round, and reactive to light.  Neck:     Thyroid: No thyroid mass, thyromegaly or thyroid tenderness.     Vascular: Normal carotid pulses. No carotid bruit, hepatojugular reflux or JVD.     Trachea: Trachea and phonation normal. No tracheal deviation.  Cardiovascular:     Rate and Rhythm: Normal rate and regular rhythm.     Chest Wall: PMI is not displaced.     Pulses:          Dorsalis pedis pulses are 1+ on the right side and 1+ on the left side.       Posterior tibial pulses are 1+ on the right side and 1+ on the left side.     Heart sounds: Normal heart sounds, S1 normal and S2 normal. No murmur heard.  No systolic murmur is present.  No diastolic murmur is present.  No friction rub. No gallop. No S3 or S4 sounds.   Pulmonary:     Effort: No respiratory distress.     Breath sounds: Normal breath sounds. No wheezing or rales.  Abdominal:     General: Bowel sounds are normal.     Palpations: Abdomen is soft. There is no mass.     Tenderness: There is no abdominal tenderness. There is no guarding or rebound.  Musculoskeletal:        General: No tenderness. Normal range of motion.     Cervical back: Normal range of motion and neck supple.     Right lower leg: 1+ Pitting Edema present.     Left lower leg: 1+ Pitting Edema present.     Right foot: Normal range of motion. No deformity, bunion, Charcot foot, foot drop or prominent metatarsal heads.     Left foot: Normal range of motion. No deformity, bunion, Charcot foot, foot drop or prominent metatarsal heads.  Feet:     Right foot:     Protective Sensation: 10 sites tested. 10 sites sensed.     Skin integrity: Skin integrity normal. No ulcer, blister, skin breakdown, erythema, warmth, callus, dry skin or fissure.     Toenail Condition: Right toenails are normal.      Left foot:     Protective Sensation: 10 sites tested. 10 sites sensed.     Skin integrity: Skin integrity normal. No ulcer, blister, skin breakdown, erythema, warmth, callus, dry skin or fissure.     Toenail Condition: Left toenails are normal.  Lymphadenopathy:  Cervical: No cervical adenopathy.     Right cervical: No superficial, deep or posterior cervical adenopathy.    Left cervical: No superficial, deep or posterior cervical adenopathy.  Skin:    General: Skin is warm.     Findings: No rash.  Neurological:     Mental Status: He is alert and oriented to person, place, and time.     Cranial Nerves: No cranial nerve deficit.     Deep Tendon Reflexes: Reflexes are normal and symmetric.     Wt Readings from Last 3 Encounters:  10/29/19 182 lb (82.6 kg)  07/13/19 182 lb (82.6 kg)  06/28/19 184 lb (83.5 kg)    BP (!) 130/78   Pulse 76   Ht 6\' 1"  (1.854 m)   Wt 182 lb (82.6 kg)   SpO2 99%   BMI 24.01 kg/m   Assessment and Plan:

## 2019-12-07 ENCOUNTER — Telehealth: Payer: Self-pay

## 2019-12-07 NOTE — Chronic Care Management (AMB) (Signed)
  Chronic Care Management   Outreach Note  12/07/2019 Name: Andrew [name] MRN: [id number] DOB: 17-Jun-1950  Andrew [name] is a 69 y.o. year old male who is a primary care patient of Juline Patch, MD. I reached out to [name] by phone today in response to a referral sent by Mr. [name]'s health plan.     An unsuccessful telephone outreach was attempted today. The patient was referred to the case management team for assistance with care management and care coordination.   Follow Up Plan: A HIPPA compliant phone message was left for the patient providing contact information and requesting a return call.  The care management team will reach out to the patient again over the next 7 days.  If patient returns call to provider office, please advise to call [facility]  at [place], [place], [place],  [address] Direct Dial: [phone] [email or web address]

## 2019-12-14 NOTE — Chronic Care Management (AMB) (Signed)
  Chronic Care Management   Note  12/14/2019 Name: Andrew [name] MRN: [id number] DOB: [birth year]  Andrew [name] is a 69 y.o. year old male who is a primary care patient of Andrew Patch, MD. I reached out to Andrew [name] by phone today in response to a referral sent by Andrew [name]'s health plan.     Andrew [name] was given information about Chronic Care Management services today including:  1. CCM service includes personalized support from designated clinical staff supervised by his physician, including individualized plan of care and coordination with other care providers 2. 24/7 contact phone numbers for assistance for urgent and routine care needs. 3. Service will only be billed when office clinical staff spend 20 minutes or more in a month to coordinate care. 4. Only one practitioner may furnish and bill the service in a calendar month. 5. The patient may stop CCM services at any time (effective at the end of the month) by phone call to the office staff. 6. The patient will be responsible for cost sharing (co-pay) of up to 20% of the service fee (after annual deductible is met).  Patient agreed to services and verbal consent obtained.   Follow up plan: Telephone appointment with care management team member scheduled for: Pharm D 12/18/2019  Noreene Larsson, [place], [place], [place] [address] Direct Dial: [phone] [email or web address]

## 2019-12-18 ENCOUNTER — Ambulatory Visit: Payer: PPO

## 2019-12-18 DIAGNOSIS — E119 Type 2 diabetes mellitus without complications: Secondary | ICD-10-CM

## 2019-12-18 DIAGNOSIS — I10 Essential (primary) hypertension: Secondary | ICD-10-CM

## 2019-12-18 NOTE — Patient Instructions (Signed)
Visit Information  It was a pleasure speaking with you today! Thank you for letting me be a part of your care team. Please call with any questions or concerns.  Goals Addressed            This Visit's Progress   . PharmD " I want to stay healthy and live longer"       [place] (see longitudinal plan of care for additional care plan information)  Current Barriers:  . Chronic Disease Management support, education, and care coordination needs related to Hypertension,Hyperlipidemia and Diabetes.   Hypertension BP Readings from Last 3 Encounters:  10/29/19 (!) 130/78  07/13/19 (!) 151/91  06/28/19 138/78   . Pharmacist Clinical Goal(s): o Over the next 90 days, patient will work with PharmD and providers to maintain BP goal <130/80 . Current regimen:  . Metoprolol tartrate 50 mg bid (has been taking daily) . Lisinopril/hctz 20-12.5 mg daily . Amlodipine 10 mg daily . Interventions: .  Comprehensive medication review performed, medication list updated in electronic medical record . Inter-disciplinary care team collaboration (see longitudinal plan of care) . Identified patient taking metoprolol once daily. He will begin twice daily dosing today. . Discussed the importance of home BP monitoring. . Discussed evidence indicating better CV outcomes when BP meds taken at night.   . Patient self care activities - Over the next 90 days, patient will: o Check BP 2-3 times weeklyu document, and provide at future appointments o Ensure daily salt intake < 2300 mg/day o Continue exercise regimen and healthy diet.  Hyperlipidemia Lab Results  Component Value Date/Time   LDLCALC 66 06/28/2019 03:28 PM   . Pharmacist Clinical Goal(s): o Over the next 90 days, patient will work with PharmD and providers to maintain LDL goal < 70 . Current regimen:  o Aspirin 81 mg EC daily o Atorvastatin 40 mg daily . Interventions: . Comprehensive medication review performed, medication list  updated in electronic medical record . Inter-disciplinary care team collaboration (see longitudinal plan of care) . Patient self care activities - Over the next 90 days, patient will: o Continue exercise regimen. Goal 30 min/day 5 days/week.  Diabetes Lab Results  Component Value Date/Time   HGBA1C 5.9 (H) 06/28/2019 03:28 PM   HGBA1C 5.5 01/31/2019 09:28 AM   . Pharmacist Clinical Goal(s): o Over the next 90 days, patient will work with PharmD and providers to maintain A1c goal <7% . Current regimen:  Marland Kitchen Glipizide xl 2.5 mg qd . Metformin 1000mg  bid . Interventions: . Comprehensive medication review performed, medication list updated in electronic medical record . Inter-disciplinary care team collaboration (see longitudinal plan of care) o Discussed the signs/symptoms of hypoglycemia. o Counseled on importance of checking home BG.  . Patient self care activities - Over the next 90 days, patient will: o Check blood sugar once daily, document, and provide at future appointments o Contact provider with any episodes of hypoglycemia   Initial goal documentation         Andrew [name] was given information about Chronic Care Management services today including:  1. CCM service includes personalized support from designated clinical staff supervised by his physician, including individualized plan of care and coordination with other care providers 2. 24/7 contact phone numbers for assistance for urgent and routine care needs. 3. Standard insurance, coinsurance, copays and deductibles apply for chronic care management only during months in which we provide at least 20 minutes of these services. Most insurances cover these services at 100%,  however patients may be responsible for any copay, coinsurance and/or deductible if applicable. This service may help you avoid the need for more expensive face-to-face services. 4. Only one practitioner may furnish and bill the service in a calendar  month. 5. The patient may stop CCM services at any time (effective at the end of the month) by phone call to the office staff.  Patient agreed to services and verbal consent obtained.   The patient verbalized understanding of instructions provided today and agreed to receive a mailed copy of patient instruction and/or educational materials. Telephone follow up appointment with pharmacy team member scheduled for: 03/11/20 @ 0900  Andrew [name] PharmD, [place] Clinical Pharmacist [phone]

## 2019-12-18 NOTE — Progress Notes (Signed)
Chronic Care Management Pharmacy  Name: Andrew [name]  MRN: [id number] DOB: 07-12-1950   Chief Complaint/ HPI  Andrew [name],  69 y.o. , male presents for their Initial CCM visit with the clinical pharmacist via telephone due to COVID-19 Pandemic.  PCP : Juline Patch, MD Patient Care Team: Juline Patch, MD as PCP - General (Family Medicine) Vladimir Faster, [facility] (Pharmacist)  Their chronic conditions include: Hypertension, Hyperlipidemia, Diabetes and Depression   Office Visits: 10/29/19- Dr. Ronnald Ramp - bp 130/78, DM foot exam,   Consult Visit:09/20/19- Dr. Sammuel Hines - vascular - Stable Type A aortic dissections, recommend BP log  No Known Allergies  Medications: Outpatient Encounter Medications as of 12/18/2019  Medication Sig  . amLODipine (NORVASC) 10 MG tablet Take 1 tablet (10 mg total) by mouth daily.  Marland Kitchen aspirin 81 MG EC tablet Take 1 tablet by mouth daily.   Marland Kitchen atorvastatin (LIPITOR) 40 MG tablet Take 1 tablet (40 mg total) by mouth daily.  Marland Kitchen glipiZIDE (GLUCOTROL XL) 2.5 MG 24 hr tablet Take 1 tablet (2.5 mg total) by mouth daily with breakfast.  . glucose blood test strip Use as instructed  . lisinopril-hydrochlorothiazide (ZESTORETIC) 20-12.5 MG tablet Take 1 tablet by mouth daily.  . metFORMIN (GLUCOPHAGE) 1000 MG tablet Take 1 tablet (1,000 mg total) by mouth 2 (two) times daily.  . metoprolol tartrate (LOPRESSOR) 50 MG tablet Take 1 tablet (50 mg total) by mouth 2 (two) times daily.  Marland Kitchen venlafaxine XR (EFFEXOR-XR) 150 MG 24 hr capsule Take 1 capsule (150 mg total) by mouth daily with breakfast.   No facility-administered encounter medications on file as of 12/18/2019.    Wt Readings from Last 3 Encounters:  10/29/19 182 lb (82.6 kg)  07/13/19 182 lb (82.6 kg)  06/28/19 184 lb (83.5 kg)    Current Diagnosis/Assessment:    Goals Addressed            This Visit's Progress   . PharmD " I want to stay healthy and live longer"       [place] (see  longitudinal plan of care for additional care plan information)  Current Barriers:  . Chronic Disease Management support, education, and care coordination needs related to Hypertension,Hyperlipidemia and Diabetes.   Hypertension BP Readings from Last 3 Encounters:  10/29/19 (!) 130/78  07/13/19 (!) 151/91  06/28/19 138/78   . Pharmacist Clinical Goal(s): o Over the next 90 days, patient will work with PharmD and providers to maintain BP goal <130/80 . Current regimen:  . Metoprolol tartrate 50 mg bid (has been taking daily) . Lisinopril/hctz 20-12.5 mg daily . Amlodipine 10 mg daily . Interventions: .  Comprehensive medication review performed, medication list updated in electronic medical record . Inter-disciplinary care team collaboration (see longitudinal plan of care) . Identified patient taking metoprolol once daily. He will begin twice daily dosing today. . Discussed the importance of home BP monitoring. . Discussed evidence indicating better CV outcomes when BP meds taken at night.   . Patient self care activities - Over the next 90 days, patient will: o Check BP 2-3 times weeklyu document, and provide at future appointments o Ensure daily salt intake < 2300 mg/day o Continue exercise regimen and healthy diet.  Hyperlipidemia Lab Results  Component Value Date/Time   LDLCALC 66 06/28/2019 03:28 PM   . Pharmacist Clinical Goal(s): o Over the next 90 days, patient will work with PharmD and providers to maintain LDL goal < 70 . Current  regimen:  o Aspirin 81 mg EC daily o Atorvastatin 40 mg daily . Interventions: . Comprehensive medication review performed, medication list updated in electronic medical record . Inter-disciplinary care team collaboration (see longitudinal plan of care) . Patient self care activities - Over the next 90 days, patient will: o Continue exercise regimen. Goal 30 min/day 5 days/week.  Diabetes Lab Results  Component Value Date/Time    HGBA1C 5.9 (H) 06/28/2019 03:28 PM   HGBA1C 5.5 01/31/2019 09:28 AM   . Pharmacist Clinical Goal(s): o Over the next 90 days, patient will work with PharmD and providers to maintain A1c goal <7% . Current regimen:  Marland Kitchen Glipizide xl 2.5 mg qd . Metformin 1000mg  bid . Interventions: . Comprehensive medication review performed, medication list updated in electronic medical record . Inter-disciplinary care team collaboration (see longitudinal plan of care) o Discussed the signs/symptoms of hypoglycemia. o Counseled on importance of checking home BG.  . Patient self care activities - Over the next 90 days, patient will: o Check blood sugar once daily, document, and provide at future appointments o Contact provider with any episodes of hypoglycemia   Initial goal documentation        Diabetes   A1c goal <7%  Recent Relevant Labs: Lab Results  Component Value Date/Time   HGBA1C 5.9 (H) 06/28/2019 03:28 PM   HGBA1C 5.5 01/31/2019 09:28 AM    Last diabetic Eye exam: No results found for: HMDIABEYEEXA  -completed in April per patient  Last diabetic Foot exam: 10/29/19 Dr. Ronnald Ramp  Right foot:     Protective Sensation: 10 sites tested. 10 sites sensed.     Skin integrity: Skin integrity normal. No ulcer, blister, skin breakdown, erythema, warmth, callus, dry skin or fissure.     Toenail Condition: Right toenails are normal.     Left foot:     Protective Sensation: 10 sites tested. 10 sites sensed.     Skin integrity: Skin integrity normal. No ulcer, blister, skin breakdown, erythema, warmth, callus, dry skin or fissure.     Toenail Condition: Left toenails are normal.   Checking BG: Rarely.  Has not checked since PCP visit because he has not been symptomatic and his A1c has been good.  Patient has failed these meds in past: None noted Patient is currently controlled on the following medications: Marland Kitchen Glipizide xl 2.5 mg qd . Metformin 1000mg  bid  We discussed: Signs and symptoms of  hypoglycemia.He denies any symptoms.  Patient enjoys walking with his dog Charlie. They used to  Walk  4-5 days for at least 30 minutes before she had surgery on her knee. They still walk for about 15 minutes/ day and will resume longer walks when ok with veterinarian.  He endorses a healthy diet consisting of salads, vegetables and lean protein for the most part. They avoid starchy and sugary foods  Plan  Continue current medications.  Hyperlipidemia  type A aortic dissection s/p replacement of the ascending aorta with Dr. Danise Mina on 12/27/18 who returns for follow up of his aortic defect distal to the [place].  LDL goal < 70  Lipid Panel     Component Value Date/Time   CHOL 144 06/28/2019 1528   TRIG 116 06/28/2019 1528   HDL 57 06/28/2019 1528   LDLCALC 66 06/28/2019 1528    Hepatic Function Latest Ref Rng & Units 06/28/2019 01/31/2019 07/31/2018  Albumin 3.8 - 4.8 g/dL 4.9(H) 4.7 4.5     The 10-year ASCVD risk score Mikey Bussing DC Brooke Bonito., et  al., 2013) is: 26.3%   Values used to calculate the score:     Age: 76 years     Sex: Male     Is Non-Hispanic African American: No     Diabetic: Yes     Tobacco smoker: No     Systolic Blood Pressure: 096 mmHg     Is BP treated: Yes     HDL Cholesterol: 57 mg/dL     Total Cholesterol: 144 mg/dL   Patient has failed these meds in past: lovastatin- muscle pain Patient is currently controlled on the following medications:  . Atorvastatin 40 mg qd . Aspirin 81 mg qd  We discussed:  Mr. Karge did not tolerate lovastatin previously due to muscle pain. He has done well on atorvastatin and denies any side effects. Aspirin was started by cardiology at [facility] after aortic dissection repair September 2020. His last follow up was in June.  Plan  Continue current medications  Hypertension   BP goal is:  <130/80  Office blood pressures are  BP Readings from Last 3 Encounters:  10/29/19 (!) 130/78  07/13/19 (!) 151/91  06/28/19 138/78   Patient checks  BP at home when feeling symptomatic Has not checked since last PCP visit  Patient has failed these meds in the past: None available Patient is currently controlled on the following medications:  Marland Kitchen Metoprolol tartrate 50 mg bid (has been taking daily) . Lisinopril/hctz 20-12.5 mg daily . Amlodipine 10 mg daily  We discussed patient has been metoprolol only once daily. He did not realize it was prescribed twice daily.He will begin taking bid today. We discussed common side effects including dizziness and fatigue. We discussed recent literature supporting evening administration of antihypertensive medications associated less cardiovascular adverse events. We discussed the importance of checking home BP reading to ensure adequate control and avoid hypotensive events. Mr. Puthoff agrees to check BP 2-3 times weekly, document and provide at future appointments.  Plan  Continue current medications.     Depression / Anxiety   PHQ9 Score:  PHQ9 SCORE ONLY 10/29/2019 06/28/2019 05/07/2019  PHQ-9 Total Score 0 1 0   GAD7 Score: GAD 7 : Generalized Anxiety Score 10/29/2019 06/28/2019 01/31/2019  Nervous, Anxious, on Edge 0 0 0  Control/stop worrying 0 0 0  Worry too much - different things 0 0 0  Trouble relaxing 0 0 0  Restless 0 0 0  Easily annoyed or irritable 0 0 0  Afraid - awful might happen 0 0 0  Total GAD 7 Score 0 0 0  Anxiety Difficulty Not difficult at all - -    Patient has failed these meds in past: N/A Patient is currently controlled on the following medications:  . Effexor XR 150 mg qd  We discussed:  Patient denies any adverse effects and is satisfied with this regimen. We discussed taking daily even when feeling good and not stopping abruptly.  Plan  Continue current medications  Vaccines   Reviewed and discussed patient's vaccination history.    Immunization History  Administered Date(s) Administered  . Influenza, High Dose Seasonal PF 04/28/2017, 05/03/2018  .  Influenza,inj,Quad PF,6+ Mos 02/17/2015, 12/27/2018  . Influenza-Unspecified 02/17/2015  . PFIZER SARS-COV-2 Vaccination 05/19/2019, 06/09/2019  . Pneumococcal Conjugate-13 04/28/2017  . Pneumococcal Polysaccharide-23 05/03/2018  . Tdap 05/06/2017    Plan  Recommended patient receive Shingrix and flu vaccine.   Medication Management   Pt uses [facility] for all medications Pt endorses 90% compliance  We discussed: Discussed  benefits of medication synchronization, packaging and delivery as well as enhanced pharmacist oversight with [facility]. Patient is interested. He previously used mail order but had issues.  Plan  Continue current medication management strategy    Follow up: 3 month phone visit  Junita Push. Kenton Kingfisher PharmD, [facility] [phone]

## 2020-01-07 ENCOUNTER — Ambulatory Visit: Payer: PPO | Attending: Internal Medicine

## 2020-01-07 DIAGNOSIS — Z23 Encounter for immunization: Secondary | ICD-10-CM

## 2020-01-07 NOTE — Progress Notes (Signed)
   Covid-19 Vaccination Clinic  Name:  JAYME CHAM    MRN: [id number] DOB: January 15, 1951  01/07/2020  Mr. Frisbie was observed post Covid-19 immunization for 15 minutes without incident. He was provided with Vaccine Information Sheet and instruction to access the V-Safe system.   Mr. Sharps was instructed to call 911 with any severe reactions post vaccine: Marland Kitchen Difficulty breathing  . Swelling of face and throat  . A fast heartbeat  . A bad rash all over body  . Dizziness and weakness

## 2020-01-21 ENCOUNTER — Telehealth: Payer: Self-pay | Admitting: Pharmacist

## 2020-01-21 NOTE — Chronic Care Management (AMB) (Signed)
    Chronic Care Management Pharmacy Assistant   Name: REYAANSH MERLO  MRN: [id number] DOB: 06/29/1950  Reason for Encounter: Follow Up Related to blood pressure and blood sugar checks.  Patient Questions:  1.  Have you seen any other providers since your last visit? No  2.  Any changes in your medicines or health? No     PCP : Juline Patch, MD  Allergies:  No Known Allergies  Medications: Outpatient Encounter Medications as of 01/21/2020  Medication Sig  . amLODipine (NORVASC) 10 MG tablet Take 1 tablet (10 mg total) by mouth daily.  Marland Kitchen aspirin 81 MG EC tablet Take 1 tablet by mouth daily.   Marland Kitchen atorvastatin (LIPITOR) 40 MG tablet Take 1 tablet (40 mg total) by mouth daily.  Marland Kitchen glipiZIDE (GLUCOTROL XL) 2.5 MG 24 hr tablet Take 1 tablet (2.5 mg total) by mouth daily with breakfast.  . glucose blood test strip Use as instructed  . lisinopril-hydrochlorothiazide (ZESTORETIC) 20-12.5 MG tablet Take 1 tablet by mouth daily.  . metFORMIN (GLUCOPHAGE) 1000 MG tablet Take 1 tablet (1,000 mg total) by mouth 2 (two) times daily.  . metoprolol tartrate (LOPRESSOR) 50 MG tablet Take 1 tablet (50 mg total) by mouth 2 (two) times daily.  Marland Kitchen venlafaxine XR (EFFEXOR-XR) 150 MG 24 hr capsule Take 1 capsule (150 mg total) by mouth daily with breakfast.   No facility-administered encounter medications on file as of 01/21/2020.    Current Diagnosis: Patient Active Problem List   Diagnosis Date Noted  . Screening for colon cancer   . Aortic dissection proximal to innominate ([place]) 12/23/2018  . Mixed hyperlipidemia 10/05/2016  . Familial multiple lipoprotein-type hyperlipidemia 08/02/2014  . Routine general medical examination at a health care facility 08/02/2014  . Recurrent major depressive episodes ([place]) 08/02/2014  . Essential (primary) hypertension 08/02/2014  . Overweight 08/02/2014  . Diabetes mellitus, type 2 ([place]) 08/02/2014    Goals Addressed   None    01-21-20: 1st attempt to  contact patient to discuss recent blood pressure and blood sugar readings. No answer. Left HIPPA compliant voicemail requesting a call back.   01-22-20: Second attempt to contact patient. No answer. Left HIPPA compliant voicemail requesting a call back.   01-23-2020: Third and final attempt to contact patient. No answer. Left HIPPA compliant voicemail requesting call back.   Birdena Crandall, [facility] verbally made aware of situation. She verbalized understanding to information given.   Cloretta Ned, LPN Clinical Pharmacist Assistant  [phone]    Follow-Up:  CPA to attempt to contact patient during the month of November 2021.  Patient has a scheduled phone follow up with CPP Birdena Crandall, [facility] on 03-11-2020

## 2020-03-11 ENCOUNTER — Ambulatory Visit: Payer: PPO | Admitting: Pharmacist

## 2020-03-11 DIAGNOSIS — I10 Essential (primary) hypertension: Secondary | ICD-10-CM

## 2020-03-11 DIAGNOSIS — E119 Type 2 diabetes mellitus without complications: Secondary | ICD-10-CM

## 2020-03-11 NOTE — Patient Instructions (Addendum)
Visit Information  It was a pleasure speaking with you today. Thank you for letting me be part of your clinical team. Please call with any questions or concerns.   Goals Addressed            This Visit's Progress   . PharmD " I want to stay healthy and live longer"       [place] (see longitudinal plan of care for additional care plan information)  Current Barriers:  . Chronic Disease Management support, education, and care coordination needs related to Hypertension,Hyperlipidemia and Diabetes.   Hypertension BP Readings from Last 3 Encounters:  10/29/19 (!) 130/78  07/13/19 (!) 151/91  06/28/19 138/78   . Pharmacist Clinical Goal(s): o Over the next 90 days, patient will work with PharmD and providers to maintain BP goal <130/80 . Current regimen:  . Metoprolol tartrate 50 mg bid . Lisinopril/hctz 20-12.5 mg daily . Amlodipine 10 mg daily . Interventions: .  Comprehensive medication review performed, medication list updated in electronic medical record . Confirmed patient  is now taking metoprolol tartrate twice daily. . Discussed the importance of home BP monitoring and proper technique   . Patient self care activities - Over the next 90 days, patient will: o Check BP 2-3 times weekly document, and provide at future appointments o Ensure daily salt intake < 2300 mg/day o Continue exercise regimen and healthy diet.  Hyperlipidemia Lab Results  Component Value Date/Time   LDLCALC 66 06/28/2019 03:28 PM   . Pharmacist Clinical Goal(s): o Over the next 90 days, patient will work with PharmD and providers to maintain LDL goal < 70 . Current regimen:  o Aspirin 81 mg EC daily o Atorvastatin 40 mg daily . Interventions: . Comprehensive medication review performed, medication list updated in electronic medical record . Provided diet and exercise counseling.  . Patient self care activities - Over the next 90 days, patient will: o Continue exercise regimen. Goal  30 min/day 5 days/week.  Diabetes Lab Results  Component Value Date/Time   HGBA1C 5.9 (H) 06/28/2019 03:28 PM   HGBA1C 5.5 01/31/2019 09:28 AM   . Pharmacist Clinical Goal(s): o Over the next 90 days, patient will work with PharmD and providers to maintain A1c goal <7% . Current regimen:  Marland Kitchen Glipizide xl 2.5 mg qd . Metformin 1000mg  bid . Interventions: . Comprehensive medication review performed, medication list updated in electronic medical record o Discussed the signs/symptoms of hypoglycemia. o Reviewed goal glucose readings for an A1c of <7%, we want to see fasting sugars <130 and 2 hour after meal sugars <180.  Marland Kitchen Patient self care activities - Over the next 90 days, patient will: o Check blood sugar once daily, document, and provide at future appointments o Contact provider with any episodes of hypoglycemia   Please see past updates related to this goal by clicking on the "Past Updates" button in the selected goal          The patient verbalized understanding of instructions, educational materials, and care plan provided today and agreed to receive a mailed copy of patient instructions, educational materials, and care plan.   Telephone follow up appointment with pharmacy team member scheduled for: 06/10/20 at 09:00  [employer]. Kenton Kingfisher PharmD, BCPS Clinical Pharmacist [phone]  Managing Your Hypertension Hypertension is commonly called high blood pressure. This is when the force of your blood pressing against the walls of your arteries is too strong. Arteries are blood vessels that carry blood from your heart  throughout your body. Hypertension forces the heart to work harder to pump blood, and may cause the arteries to become narrow or stiff. Having untreated or uncontrolled hypertension can cause heart attack, stroke, kidney disease, and other problems. What are blood pressure readings? A blood pressure reading consists of a higher number over a lower number. Ideally,  your blood pressure should be below 120/80. The first ("top") number is called the systolic pressure. It is a measure of the pressure in your arteries as your heart beats. The second ("bottom") number is called the diastolic pressure. It is a measure of the pressure in your arteries as the heart relaxes. What does my blood pressure reading mean? Blood pressure is classified into four stages. Based on your blood pressure reading, your health care provider may use the following stages to determine what type of treatment you need, if any. Systolic pressure and diastolic pressure are measured in a unit called mm Hg. Normal  Systolic pressure: below 353.  Diastolic pressure: below 80. Elevated  Systolic pressure: 299-242.  Diastolic pressure: below 80. Hypertension stage 1  Systolic pressure: 683-419.  Diastolic pressure: 62-22. Hypertension stage 2  Systolic pressure: 979 or above.  Diastolic pressure: 90 or above. What health risks are associated with hypertension? Managing your hypertension is an important responsibility. Uncontrolled hypertension can lead to:  A heart attack.  A stroke.  A weakened blood vessel (aneurysm).  Heart failure.  Kidney damage.  Eye damage.  Metabolic syndrome.  Memory and concentration problems. What changes can I make to manage my hypertension? Hypertension can be managed by making lifestyle changes and possibly by taking medicines. Your health care provider will help you make a plan to bring your blood pressure within a normal range. Eating and drinking   Eat a diet that is high in fiber and potassium, and low in salt (sodium), added sugar, and fat. An example eating plan is called the DASH (Dietary Approaches to Stop Hypertension) diet. To eat this way: ? Eat plenty of fresh fruits and vegetables. Try to fill half of your plate at each meal with fruits and vegetables. ? Eat whole grains, such as whole wheat pasta, brown rice, or whole  grain bread. Fill about one quarter of your plate with whole grains. ? Eat low-fat diary products. ? Avoid fatty cuts of meat, processed or cured meats, and poultry with skin. Fill about one quarter of your plate with lean proteins such as fish, chicken without skin, beans, eggs, and tofu. ? Avoid premade and processed foods. These tend to be higher in sodium, added sugar, and fat.  Reduce your daily sodium intake. Most people with hypertension should eat less than 1,500 mg of sodium a day.  Limit alcohol intake to no more than 1 drink a day for nonpregnant women and 2 drinks a day for men. One drink equals 12 oz of beer, 5 oz of wine, or 1 oz of hard liquor. Lifestyle  Work with your health care provider to maintain a healthy body weight, or to lose weight. Ask what an ideal weight is for you.  Get at least 30 minutes of exercise that causes your heart to beat faster (aerobic exercise) most days of the week. Activities may include walking, swimming, or biking.  Include exercise to strengthen your muscles (resistance exercise), such as weight lifting, as part of your weekly exercise routine. Try to do these types of exercises for 30 minutes at least 3 days a week.  Do not use  any products that contain nicotine or tobacco, such as cigarettes and e-cigarettes. If you need help quitting, ask your health care provider.  Control any long-term (chronic) conditions you have, such as high cholesterol or diabetes. Monitoring  Monitor your blood pressure at home as told by your health care provider. Your personal target blood pressure may vary depending on your medical conditions, your age, and other factors.  Have your blood pressure checked regularly, as often as told by your health care provider. Working with your health care provider  Review all the medicines you take with your health care provider because there may be side effects or interactions.  Talk with your health care provider about  your diet, exercise habits, and other lifestyle factors that may be contributing to hypertension.  Visit your health care provider regularly. Your health care provider can help you create and adjust your plan for managing hypertension. Will I need medicine to control my blood pressure? Your health care provider may prescribe medicine if lifestyle changes are not enough to get your blood pressure under control, and if:  Your systolic blood pressure is 130 or higher.  Your diastolic blood pressure is 80 or higher. Take medicines only as told by your health care provider. Follow the directions carefully. Blood pressure medicines must be taken as prescribed. The medicine does not work as well when you skip doses. Skipping doses also puts you at risk for problems. Contact a health care provider if:  You think you are having a reaction to medicines you have taken.  You have repeated (recurrent) headaches.  You feel dizzy.  You have swelling in your ankles.  You have trouble with your vision. Get help right away if:  You develop a severe headache or confusion.  You have unusual weakness or numbness, or you feel faint.  You have severe pain in your chest or abdomen.  You vomit repeatedly.  You have trouble breathing. Summary  Hypertension is when the force of blood pumping through your arteries is too strong. If this condition is not controlled, it may put you at risk for serious complications.  Your personal target blood pressure may vary depending on your medical conditions, your age, and other factors. For most people, a normal blood pressure is less than 120/80.  Hypertension is managed by lifestyle changes, medicines, or both. Lifestyle changes include weight loss, eating a healthy, low-sodium diet, exercising more, and limiting alcohol. This information is not intended to replace advice given to you by your health care provider. Make sure you discuss any questions you have with  your health care provider. Document Revised: 07/07/2018 Document Reviewed: 02/11/2016 Elsevier Patient Education  [place].

## 2020-03-11 NOTE — Progress Notes (Signed)
Chronic Care Management Pharm[facility]  Name: Andrew [name]  MRN: [id number] DOB: 26-Aug-1950   Chief Complaint/ HPI  Andrew [name],  69 y.o. , male presents for their Initial CCM visit with the clinical pharmacist via telephone due to COVID-19 Pandemic.  PCP : Andrew Patch, MD Patient Care Team: Andrew Patch, MD as PCP - General (Family Medicine) Andrew [name], [facility] (Pharmacist)  Their chronic conditions include: Hypertension, Hyperlipidemia, Diabetes and Depression   Office Visits: 10/29/19- Dr. Ronnald [name] - bp 130/78, DM foot exam,   Consult Visit:09/20/19- Dr. Sammuel [name] - vascular - Stable Type A aortic dissections, recommend BP log  No Known Allergies  Medications: Outpatient Encounter Medications as of 03/11/2020  Medication Sig  . amLODipine (NORVASC) 10 MG tablet Take 1 tablet (10 mg total) by mouth daily.  Marland Kitchen aspirin 81 MG EC tablet Take 1 tablet by mouth daily.   Marland Kitchen atorvastatin (LIPITOR) 40 MG tablet Take 1 tablet (40 mg total) by mouth daily.  Marland Kitchen glipiZIDE (GLUCOTROL XL) 2.5 MG 24 hr tablet Take 1 tablet (2.5 mg total) by mouth daily with breakfast.  . glucose blood test strip Use as instructed  . lisinopril-hydrochlorothiazide (ZESTORETIC) 20-12.5 MG tablet Take 1 tablet by mouth daily.  . metFORMIN (GLUCOPHAGE) 1000 MG tablet Take 1 tablet (1,000 mg total) by mouth 2 (two) times daily.  . metoprolol tartrate (LOPRESSOR) 50 MG tablet Take 1 tablet (50 mg total) by mouth 2 (two) times daily.  Marland Kitchen venlafaxine XR (EFFEXOR-XR) 150 MG 24 hr capsule Take 1 capsule (150 mg total) by mouth daily with breakfast.   No facility-administered encounter medications on file as of 03/11/2020.    Wt Readings from Last 3 Encounters:  10/29/19 182 lb (82.6 kg)  07/13/19 182 lb (82.6 kg)  06/28/19 184 lb (83.5 kg)    Current Diagnosis/Assessment:    Goals Addressed   None    Diabetes   A1c goal <7%  Recent Relevant Labs: Lab Results  Component Value Date/Time   HGBA1C 5.9  (H) 06/28/2019 03:28 PM   HGBA1C 5.5 01/31/2019 09:28 AM    Last diabetic Eye exam: No results found for: HMDIABEYEEXA  -completed in April per patient  Last diabetic Foot exam: 10/29/19 Dr. Ronnald [name]  Right foot:     Protective Sensation: 10 sites tested. 10 sites sensed.     Skin integrity: Skin integrity normal. No ulcer, blister, skin breakdown, erythema, warmth, callus, dry skin or fissure.     Toenail Condition: Right toenails are normal.     Left foot:     Protective Sensation: 10 sites tested. 10 sites sensed.     Skin integrity: Skin integrity normal. No ulcer, blister, skin breakdown, erythema, warmth, callus, dry skin or fissure.     Toenail Condition: Left toenails are normal.   Checking BG: 1-2 times weekly FBG readings: 120s  Patient has failed these meds in past: None noted Patient is currently controlled on the following medications: Marland Kitchen Glipizide xl 2.5 mg qd . Metformin 1000mg  bid  We discussed: Signs and symptoms of hypoglycemia.He denies any symptoms.  Patient has not resumed daily 4-5 mile walks as hi dog Andrew [name] still has a limp from her surgery. They still walk for about 15 minutes/ day and Andrew [name] occasionally  walks 3 mile at [place]. He plans to resume on a regular basis. He endorses a healthy diet consisting of salads, vegetables and lean protein for the most part. They avoid starchy and sugary foods  Plan  Continue current medications.  Hyperlipidemia  type A aortic dissection s/p replacement of the ascending aorta with Dr. Danise [name] on 12/27/18 who returns for follow up of his aortic defect distal to the [place].  LDL goal < 70  Lipid Panel     Component Value Date/Time   CHOL 144 06/28/2019 1528   TRIG 116 06/28/2019 1528   HDL 57 06/28/2019 1528   LDLCALC 66 06/28/2019 1528    Hepatic Function Latest Ref Rng & Units 06/28/2019 01/31/2019 07/31/2018  Albumin 3.8 - 4.8 g/dL 4.9(H) 4.7 4.5     The 10-year ASCVD risk score Andrew [name] DC Jr., et al., 2013) is:  26.3%   Values used to calculate the score:     Age: 69 years     Sex: Male     Is Non-Hispanic African American: No     Diabetic: Yes     Tobacco smoker: No     Systolic Blood Pressure: 546 mmHg     Is BP treated: Yes     HDL Cholesterol: 57 mg/dL     Total Cholesterol: 144 mg/dL   Patient has failed these meds in past: lovastatin- muscle pain Patient is currently controlled on the following medications:  . Atorvastatin 40 mg qd . Aspirin 81 mg qd  We discussed:  Andrew. [name] did not tolerate lovastatin previously due to muscle pain. He has done well on atorvastatin and denies any side effects. Aspirin was started by cardiology at [facility] after aortic dissection repair September 2020. His next follow up is in April.   Plan  Continue current medications  Hypertension   BP goal is:  <130/80  Office blood pressures are  BP Readings from Last 3 Encounters:  10/29/19 (!) 130/78  07/13/19 (!) 151/91  06/28/19 138/78   Patient checks BP at home weekly Has not checked since last PCP visit  Patient has failed these meds in the past: None available Patient is currently controlled on the following medications:  Marland Kitchen Metoprolol tartrate 50 mg bid . Lisinopril/hctz 20-12.5 mg daily . Amlodipine 10 mg daily  We discussed patient is taking metoprolol twice daily and is tolerating well. He denies any dizziness or fatigue. Patient reports most recent BP reading last week 150/79 prior to am medications and 120/70 several hours after BP medications.  Encouraged  to check BP 2-3 times weekly, document and provide at future appointments. Andrew. [place] reports an in home visit from a nurse through his health plan last Thursday. He states she checked on his medications and asked him questions.  Plan  Continue current medications.     Vaccines   Reviewed and discussed patient's vaccination history.    Immunization History  Administered Date(s) Administered  . Influenza, High Dose Seasonal PF  04/28/2017, 05/03/2018  . Influenza,inj,Quad PF,6+ Mos 02/17/2015, 12/27/2018  . Influenza-Unspecified 02/17/2015  . PFIZER SARS-COV-2 Vaccination 05/19/2019, 06/09/2019, 01/07/2020  . Pneumococcal Conjugate-13 04/28/2017  . Pneumococcal Polysaccharide-23 05/03/2018  . Tdap 05/06/2017    Plan  Recommended patient receive Shingrix.   Medication Management   Pt uses [facility] for all medications Pt endorses 95% compliance   Plan  Continue current medication management strategy    Follow up: 3 month phone visit  Junita Push. Kenton Kingfisher PharmD, [facility] [phone]

## 2020-05-02 ENCOUNTER — Encounter: Payer: Self-pay | Admitting: Radiology

## 2020-05-02 ENCOUNTER — Other Ambulatory Visit: Payer: Self-pay

## 2020-05-02 ENCOUNTER — Encounter: Payer: Self-pay | Admitting: Family Medicine

## 2020-05-02 ENCOUNTER — Emergency Department
Admission: EM | Admit: 2020-05-02 | Discharge: 2020-05-02 | Disposition: A | Discharge status: Home / Self Care | Payer: PPO | Attending: Emergency Medicine | Admitting: Emergency Medicine

## 2020-05-02 ENCOUNTER — Emergency Department: Payer: PPO

## 2020-05-02 DIAGNOSIS — I10 Essential (primary) hypertension: Secondary | ICD-10-CM | POA: Insufficient documentation

## 2020-05-02 DIAGNOSIS — E119 Type 2 diabetes mellitus without complications: Secondary | ICD-10-CM | POA: Diagnosis not present

## 2020-05-02 DIAGNOSIS — Z7984 Long term (current) use of oral hypoglycemic drugs: Secondary | ICD-10-CM | POA: Insufficient documentation

## 2020-05-02 DIAGNOSIS — I7090 Unspecified atherosclerosis: Secondary | ICD-10-CM | POA: Diagnosis not present

## 2020-05-02 DIAGNOSIS — Z7982 Long term (current) use of aspirin: Secondary | ICD-10-CM | POA: Insufficient documentation

## 2020-05-02 DIAGNOSIS — Z9889 Other specified postprocedural states: Secondary | ICD-10-CM

## 2020-05-02 DIAGNOSIS — I700 Atherosclerosis of aorta: Secondary | ICD-10-CM | POA: Insufficient documentation

## 2020-05-02 DIAGNOSIS — Z95828 Presence of other vascular implants and grafts: Secondary | ICD-10-CM | POA: Diagnosis not present

## 2020-05-02 DIAGNOSIS — Z79899 Other long term (current) drug therapy: Secondary | ICD-10-CM | POA: Diagnosis not present

## 2020-05-02 DIAGNOSIS — R0602 Shortness of breath: Secondary | ICD-10-CM | POA: Diagnosis not present

## 2020-05-02 DIAGNOSIS — J9811 Atelectasis: Secondary | ICD-10-CM | POA: Diagnosis not present

## 2020-05-02 DIAGNOSIS — N200 Calculus of kidney: Secondary | ICD-10-CM | POA: Diagnosis not present

## 2020-05-02 DIAGNOSIS — Z20822 Contact with and (suspected) exposure to covid-19: Secondary | ICD-10-CM | POA: Diagnosis not present

## 2020-05-02 LAB — CBC WITH DIFFERENTIAL/PLATELET
Abs Immature Granulocytes: 0.02 10*3/uL (ref 0.00–0.07)
Basophils Absolute: 0.1 10*3/uL (ref 0.0–0.1)
Basophils Relative: 1 %
Eosinophils Absolute: 0.1 10*3/uL (ref 0.0–0.5)
Eosinophils Relative: 1 %
HCT: 46.5 % (ref 39.0–52.0)
Hemoglobin: 16.1 g/dL (ref 13.0–17.0)
Immature Granulocytes: 0 %
Lymphocytes Relative: 21 %
Lymphs Abs: 1.3 10*3/uL (ref 0.7–4.0)
MCH: 32.9 pg (ref 26.0–34.0)
MCHC: 34.6 g/dL (ref 30.0–36.0)
MCV: 95.1 fL (ref 80.0–100.0)
Monocytes Absolute: 0.4 10*3/uL (ref 0.1–1.0)
Monocytes Relative: 6 %
Neutro Abs: 4.4 10*3/uL (ref 1.7–7.7)
Neutrophils Relative %: 71 %
Platelets: 300 10*3/uL (ref 150–400)
RBC: 4.89 MIL/uL (ref 4.22–5.81)
RDW: 13.2 % (ref 11.5–15.5)
WBC: 6.2 10*3/uL (ref 4.0–10.5)
nRBC: 0 % (ref 0.0–0.2)

## 2020-05-02 LAB — BASIC METABOLIC PANEL
Anion gap: 14 (ref 5–15)
BUN: 20 mg/dL (ref 8–23)
CO2: 30 mmol/L (ref 22–32)
Calcium: 9.5 mg/dL (ref 8.9–10.3)
Chloride: 99 mmol/L (ref 98–111)
Creatinine, Ser: 1 mg/dL (ref 0.61–1.24)
GFR, Estimated: >60 mL/min (ref >60)
Glucose, Bld: 184 mg/dL — ABNORMAL HIGH (ref 70–99)
Potassium: 3.5 mmol/L (ref 3.5–5.1)
Sodium: 143 mmol/L (ref 135–145)

## 2020-05-02 LAB — TROPONIN I (HIGH SENSITIVITY)
Troponin I (High Sensitivity): 5 ng/L (ref <18)
Troponin I (High Sensitivity): 5 ng/L (ref <18)

## 2020-05-02 LAB — SARS CORONAVIRUS 2 BY RT PCR (HOSPITAL ORDER, PERFORMED IN ~~LOC~~ HOSPITAL LAB): SARS Coronavirus 2: NEGATIVE

## 2020-05-02 LAB — PROCALCITONIN: Procalcitonin: <0.1 ng/mL

## 2020-05-02 MED ORDER — ASPIRIN 81 MG PO CHEW
324.0000 mg | CHEWABLE_TABLET | Freq: Once | ORAL | Status: AC
  Administered 2020-05-02: 324 mg via ORAL
  Filled 2020-05-02: qty 4

## 2020-05-02 MED ORDER — IOHEXOL 350 MG/ML SOLN
75.0000 mL | Freq: Once | INTRAVENOUS | Status: AC | PRN
  Administered 2020-05-02: 75 mL via INTRAVENOUS

## 2020-05-02 NOTE — ED Notes (Signed)
Pt o2 increased to 3 liters via [place]

## 2020-05-02 NOTE — ED Notes (Signed)
Pt transported to xray 

## 2020-05-02 NOTE — ED Provider Notes (Addendum)
[facility] Emergency Department Provider Note  ____________________________________________   Event Date/Time   First MD Initiated Contact with Patient 05/02/20 1140     (approximate)  I have reviewed the triage vital signs and the nursing notes.   HISTORY  Chief Complaint Shortness of Breath   HPI Andrew [name] is a 70 y.o. male with a past medical history of HTN, HDL, DM, CAD, and aortic dissection s/p endovascular repair  who presents for assessment of some shortness of breath associated with a "tickling in my throat" that he noticed earlier this morning.  He states he felt fine yesterday.  He denies any other acute sick symptoms including cough, pain in his throat, chest pain, headache, earache, back pain, abdominal pain, nausea, vomiting, diarrhea, dysuria, rash or extremity weakness numbness or tingling.  He is not currently on any blood thinners.  Endorses remote tobacco abuse but is not smoked in many years.  Does not drink regularly and does not use illegal drugs.  No other acute concerns at this time.  No clear alleviating aggravating factors.  He does state that when he had his initial heart attack requiring CABG surgery he had some soreness in his neck.         Past Medical History:  Diagnosis Date  . Depression   . Diabetes mellitus without complication ([place])   . Hyperlipidemia   . Hypertension     Patient Active Problem List   Diagnosis Date Noted  . Aortic atherosclerosis ([place]) 05/02/2020  . Screening for colon cancer   . Aortic dissection proximal to innominate ([place]) 12/23/2018  . Mixed hyperlipidemia 10/05/2016  . Familial multiple lipoprotein-type hyperlipidemia 08/02/2014  . Routine general medical examination at a health care facility 08/02/2014  . Recurrent major depressive episodes ([place]) 08/02/2014  . Essential (primary) hypertension 08/02/2014  . Overweight 08/02/2014  . Diabetes mellitus, type 2 ([place]) 08/02/2014     Past Surgical History:  Procedure Laterality Date  . COLONOSCOPY WITH PROPOFOL N/A 07/13/2019   Procedure: COLONOSCOPY WITH PROPOFOL;  Surgeon: [name], MD;  Location: [facility] ENDOSCOPY;  Service: Endoscopy;  Laterality: N/A;  . NASAL SINUS SURGERY    . REPAIR OF ACUTE ASCENDING THORACIC AORTIC DISSECTION  12/2018    Prior to Admission medications   Medication Sig Start Date End Date Taking? Authorizing Provider  amLODipine (NORVASC) 10 MG tablet Take 1 tablet (10 mg total) by mouth daily. 10/29/19   Juline Patch, MD  aspirin 81 MG EC tablet Take 1 tablet by mouth daily.  12/28/18   [provider]  atorvastatin (LIPITOR) 40 MG tablet Take 1 tablet (40 mg total) by mouth daily. 10/29/19   Juline Patch, MD  glipiZIDE (GLUCOTROL XL) 2.5 MG 24 hr tablet Take 1 tablet (2.5 mg total) by mouth daily with breakfast. 10/29/19   [name] C, MD  glucose blood test strip Use as instructed 08/01/18   Juline Patch, MD  lisinopril-hydrochlorothiazide (ZESTORETIC) 20-12.5 MG tablet Take 1 tablet by mouth daily. 10/29/19   Juline Patch, MD  metFORMIN (GLUCOPHAGE) 1000 MG tablet Take 1 tablet (1,000 mg total) by mouth 2 (two) times daily. 10/29/19   Juline Patch, MD  metoprolol tartrate (LOPRESSOR) 50 MG tablet Take 1 tablet (50 mg total) by mouth 2 (two) times daily. 10/29/19   Juline Patch, MD  venlafaxine XR (EFFEXOR-XR) 150 MG 24 hr capsule Take 1 capsule (150 mg total) by mouth daily with breakfast. 10/29/19  Juline Patch, MD    Allergies Patient has no known allergies.  Family History  Problem Relation Age of Onset  . Heart disease Mother   . Healthy Father     Social History Social History   Tobacco Use  . Smoking status: Never Smoker  . Smokeless tobacco: Never Used  . Tobacco comment: smoking cessation materials not required  Vaping Use  . Vaping Use: Never used  Substance Use Topics  . Alcohol use: Yes    Alcohol/week: 5.0 standard drinks    Types: 2  Glasses of wine, 3 Cans of beer per week    Comment: weekly  . Drug use: No    Review of Systems  Review of Systems  Constitutional: Negative for chills and fever.  HENT: Negative for sore throat.   Eyes: Negative for pain.  Respiratory: Positive for shortness of breath. Negative for cough and stridor.   Cardiovascular: Negative for chest pain.  Gastrointestinal: Negative for vomiting.  Skin: Negative for rash.  Neurological: Negative for seizures, loss of consciousness and headaches.  Psychiatric/Behavioral: Negative for suicidal ideas.  All other systems reviewed and are negative.     ____________________________________________   PHYSICAL EXAM:  VITAL SIGNS: ED Triage Vitals  Enc Vitals Group     BP 05/02/20 1128 (!) 166/94     Pulse Rate 05/02/20 1128 73     Resp 05/02/20 1128 20     Temp --      Temp src --      SpO2 05/02/20 1128 (!) 88 %     Weight 05/02/20 1122 180 lb (81.6 kg)     Height 05/02/20 1122 6\' 1"  (1.854 m)     Head Circumference --      Peak Flow --      Pain Score 05/02/20 1122 0     Pain Loc --      Pain Edu? --      Excl. in [place]? --    Vitals:   05/02/20 1430 05/02/20 1500  BP: (!) 145/82 (!) 147/93  Pulse: (!) 52 (!) 49  Resp: 19 19  Temp:    SpO2: 94% 92%   Physical Exam Vitals and nursing note reviewed.  Constitutional:      Appearance: He is well-developed and well-nourished.  HENT:     Head: Normocephalic and atraumatic.     Right Ear: External ear normal.     Left Ear: External ear normal.     Nose: Nose normal.     Mouth/Throat:     Mouth: Mucous membranes are moist.  Eyes:     Conjunctiva/sclera: Conjunctivae normal.  Cardiovascular:     Rate and Rhythm: Normal rate and regular rhythm.     Pulses: Normal pulses.     Heart sounds: No murmur heard.   Pulmonary:     Effort: Pulmonary effort is normal. No respiratory distress.     Breath sounds: Normal breath sounds.  Abdominal:     Palpations: Abdomen is soft.      Tenderness: There is no abdominal tenderness.  Musculoskeletal:        General: No edema.     Cervical back: Neck supple.     Right lower leg: No edema.     Left lower leg: No edema.  Skin:    General: Skin is warm and dry.     Capillary Refill: Capillary refill takes less than 2 seconds.  Neurological:     Mental Status: He is alert  and oriented to person, place, and time.  Psychiatric:        Mood and Affect: Mood and affect and mood normal.     Cranial nerves II through XII grossly intact.  Patient has large motion of his neck.  Oropharynx is unremarkable.  No tenderness overlying skin changes of the patient's neck. ____________________________________________   LABS (all labs ordered are listed, but only abnormal results are displayed)  Labs Reviewed  BASIC METABOLIC PANEL - Abnormal; Notable for the following components:      Result Value   Glucose, Bld 184 (*)    All other components within normal limits  SARS CORONAVIRUS 2 BY RT PCR (HOSPITAL ORDER, [facility])  CBC WITH DIFFERENTIAL/PLATELET  PROCALCITONIN  TROPONIN I (HIGH SENSITIVITY)  TROPONIN I (HIGH SENSITIVITY)   ____________________________________________  EKG  Sinus rhythm with a ventricular rate of 76, left axis deviation, nonspecific ST changes in inferior leads as well as nonspecific elevation in aVR and some nonspecific change in lateral leads V5 and V6.  There is also slight T wave inversion in V2. ____________________________________________  RADIOLOGY  ED MD interpretation: No focal consolidation, pneumothorax, effusion, edema, or other clear acute intrathoracic process.  Official radiology report(s): DG Chest 2 View  Result Date: 05/02/2020 CLINICAL DATA:  Shortness of breath EXAM: CHEST - 2 VIEW COMPARISON:  04/28/2008 FINDINGS: Chronic elevation of the right diaphragm with interposed colon and mild overlying scar. There is no edema, consolidation, effusion, or  pneumothorax. Prior median sternotomy. Normal heart size and mediastinal contours. IMPRESSION: No evidence of active disease. Electronically Signed   By: Monte Fantasia M.D.   On: 05/02/2020 11:49   CT Angio Chest PE W and/or Wo Contrast  Result Date: 05/02/2020 CLINICAL DATA:  Shortness of breath EXAM: CT ANGIOGRAPHY CHEST WITH CONTRAST TECHNIQUE: Multidetector CT imaging of the chest was performed using the standard protocol during bolus administration of intravenous contrast. Multiplanar CT image reconstructions and MIPs were obtained to evaluate the vascular anatomy. CONTRAST:  17mL OMNIPAQUE IOHEXOL 350 MG/ML SOLN COMPARISON:  December 22, 2018 FINDINGS: Cardiovascular: Satisfactory opacification of the pulmonary arteries to the segmental level with mildly limited evaluation of the bases secondary to respiratory motion. No evidence of pulmonary embolism. No pericardial effusion. Unchanged appearance of the aortic arch with focal convex contour posteriorly status post surgical repair of a type A dissection. Moderate atherosclerotic calcifications throughout the course of the descending thoracic aorta. Three-vessel coronary artery atherosclerotic calcifications. Mediastinum/Nodes: Thyroid is unremarkable. No axillary or mediastinal adenopathy. Lungs/Pleura: Elevation of the RIGHT hemidiaphragm with unchanged bronchiectasis of the RIGHT lower lobe. There is unchanged mild bronchial wall thickening and downstream atelectasis. No pleural effusion or pneumothorax. Upper Abdomen: Nonobstructive RIGHT-sided nephrolithiasis. Musculoskeletal: Status post median sternotomy. Degenerative changes of the thoracolumbar spine. Review of the MIP images confirms the above findings. IMPRESSION: 1. No evidence of acute pulmonary embolism. 2. Status post open repair of an ascending thoracic aortic dissection. Aortic Atherosclerosis (ICD10-I70.0). Electronically Signed   By: Valentino Saxon MD   On: 05/02/2020 13:22     ____________________________________________   PROCEDURES  Procedure(s) performed (including Critical Care):  .1-3 Lead EKG Interpretation Performed by: Lucrezia Starch, MD Authorized by: Lucrezia Starch, MD     Interpretation: normal     ECG rate assessment: normal     Rhythm: sinus rhythm     Ectopy: none     Conduction: normal       ____________________________________________   INITIAL IMPRESSION /  ASSESSMENT AND PLAN / ED COURSE       Patient presents with above-stated exam for assessment of some shortness of breath and a tickling sensation in his throat that he noticed this morning.  He is slightly hypertensive and hypoxic on arrival.  He was placed on 2 L nasal cannula with improvement to the mid 90s.  Otherwise he is stable on exam.  Oropharynx is unremarkable patient's lungs are clear bilaterally although he does have a cardiac murmur.  Primary differential includes but is not limited to PE, pneumothorax, pneumonia, ACS, arrhythmia, symptomatic anemia, bronchitis, CHF, and symptomatic pleural effusion.  No findings on exam to suggest deep space infection of the head or neck.  Unclear patient's reported initial hypoxia was spurious as he was rapidly weaned to room air on arrival to the ED exam room and was noted to maintain his sats in the mid 90s throughout his stay.  Low suspicion for ACS despite nonspecific changes given 2 nonelevated troponins obtained over 2 hours.  CBC remarkable for no leukocytosis or acute anemia.  BMP remarkable for glucose of 94 with no significant actually metabolic derangements.  Covid is negative.  Chest x-ray and CT show no evidence of pneumonia, pneumothorax, pulmonary edema, significant effusion, or other clear acute intrathoracic process.  CTA shows no evidence of PE, pericardial effusion and apparent stable repair of prior aortic aneurysm.  ASA given on arrival after initial ECG was concerning changes but given nonelevated troponins  no additional medications indicated.  On my reassessment patient states he does not have any shortness of breath noted to have an SPO2 of 94% on room air.  Suspect possible bronchitis versus possible early laryngitis.  Given stable vitals otherwise reassuring exam and work-up and patient stating he no more short of breath with his safer discharge with plan for close outpatient follow-up. ____________________________________________   FINAL CLINICAL IMPRESSION(S) / ED DIAGNOSES  Final diagnoses:  SOB (shortness of breath)  Atherosclerosis  S/P aortic dissection repair    Medications  aspirin chewable tablet 324 mg (324 mg Oral Given 05/02/20 1311)  iohexol (OMNIPAQUE) 350 MG/ML injection 75 mL (75 mLs Intravenous Contrast Given 05/02/20 1259)     ED Discharge Orders    None       Note:  This document was prepared using Dragon voice recognition software and may include unintentional dictation errors.   Lucrezia Starch, MD 05/02/20 1525    Lucrezia Starch, MD 05/02/20 2055

## 2020-05-02 NOTE — ED Triage Notes (Signed)
Pt to ED via POV c/o shortness of breath. Pt states that symptoms started this morning. Pt states that around 1030 he noted he had a tickle in the back of this throat and it has progressed into shortness of breath. Pt denies chest pain. Pt states that he does not feel like he can get a good deep breath. Pt has hx/o cardiac surgery about 2 years ago. Pt is currently in NAD. Pt is able to speak in complete sentences.

## 2020-05-07 ENCOUNTER — Other Ambulatory Visit: Payer: Self-pay

## 2020-05-07 ENCOUNTER — Ambulatory Visit (INDEPENDENT_AMBULATORY_CARE_PROVIDER_SITE_OTHER): Payer: PPO

## 2020-05-07 DIAGNOSIS — Z0000 Encounter for general adult medical examination without abnormal findings: Secondary | ICD-10-CM

## 2020-05-07 NOTE — Progress Notes (Signed)
Subjective:   Andrew [name] is a 70 y.o. male who presents for Medicare Annual/Subsequent preventive examination.  Review of Systems     Cardiac Risk Factors include: advanced age (>21men, >17 women);diabetes mellitus;dyslipidemia;male gender;hypertension     Objective:    There were no vitals filed for this visit. There is no height or weight on file to calculate BMI.  Advanced Directives 05/07/2020 05/02/2020 07/13/2019 05/07/2019 12/21/2018 05/03/2018 04/28/2017  Does Patient Have a Medical Advance Directive? No No No Yes No No No  Type of Advance Directive - - Public librarian;Living will - - -  Copy of [place] in Chart? - - - No - copy requested - - -  Would patient like information on creating a medical advance directive? Yes (MAU/Ambulatory/Procedural Areas - Information given) - No - Patient declined - No - Patient declined Yes (MAU/Ambulatory/Procedural Areas - Information given) Yes (MAU/Ambulatory/Procedural Areas - Information given)    Current Medications (verified) Outpatient Encounter Medications as of 05/07/2020  Medication Sig  . amLODipine (NORVASC) 10 MG tablet Take 1 tablet (10 mg total) by mouth daily.  Marland Kitchen aspirin 81 MG EC tablet Take 1 tablet by mouth daily.   Marland Kitchen atorvastatin (LIPITOR) 40 MG tablet Take 1 tablet (40 mg total) by mouth daily.  Marland Kitchen glipiZIDE (GLUCOTROL XL) 2.5 MG 24 hr tablet Take 1 tablet (2.5 mg total) by mouth daily with breakfast.  . glucose blood test strip Use as instructed  . lisinopril-hydrochlorothiazide (ZESTORETIC) 20-12.5 MG tablet Take 1 tablet by mouth daily.  . metFORMIN (GLUCOPHAGE) 1000 MG tablet Take 1 tablet (1,000 mg total) by mouth 2 (two) times daily.  . metoprolol tartrate (LOPRESSOR) 50 MG tablet Take 1 tablet (50 mg total) by mouth 2 (two) times daily.  Marland Kitchen venlafaxine XR (EFFEXOR-XR) 150 MG 24 hr capsule Take 1 capsule (150 mg total) by mouth daily with breakfast.   No facility-administered  encounter medications on file as of 05/07/2020.    Allergies (verified) Patient has no known allergies.   History: Past Medical History:  Diagnosis Date  . Depression   . Diabetes mellitus without complication ([place])   . Hyperlipidemia   . Hypertension    Past Surgical History:  Procedure Laterality Date  . COLONOSCOPY WITH PROPOFOL N/A 07/13/2019   Procedure: COLONOSCOPY WITH PROPOFOL;  Surgeon: [name], MD;  Location: [facility] ENDOSCOPY;  Service: Endoscopy;  Laterality: N/A;  . NASAL SINUS SURGERY    . REPAIR OF ACUTE ASCENDING THORACIC AORTIC DISSECTION  12/2018   Family History  Problem Relation Age of Onset  . Heart disease Mother   . Healthy Father    Social History   Socioeconomic History  . Marital status: Married    Spouse name: Not on file  . Number of children: 1  . Years of education: Not on file  . Highest education level: Associate degree: academic program  Occupational History  . Occupation: Retired  Tobacco Use  . Smoking status: Never Smoker  . Smokeless tobacco: Never Used  . Tobacco comment: smoking cessation materials not required  Vaping Use  . Vaping Use: Never used  Substance and Sexual Activity  . Alcohol use: Yes    Alcohol/week: 5.0 standard drinks    Types: 2 Glasses of wine, 3 Cans of beer per week    Comment: weekly  . Drug use: No  . Sexual activity: Not Currently  Other Topics Concern  . Not on file  Social History Narrative  .  Not on file   Social Determinants of Health   Financial Resource Strain: Low Risk   . Difficulty of Paying Living Expenses: Not hard at all  Food Insecurity: No Food Insecurity  . Worried About Charity fundraiser in the Last Year: Never true  . Ran Out of Food in the Last Year: Never true  Transportation Needs: No Transportation Needs  . Lack of Transportation (Medical): No  . Lack of Transportation (Non-Medical): No  Physical Activity: Sufficiently Active  . Days of Exercise per Week: 5 days  .  Minutes of Exercise per Session: 40 min  Stress: No Stress Concern Present  . Feeling of Stress : Not at all  Social Connections: Moderately Isolated  . Frequency of Communication with Friends and Family: More than three times a week  . Frequency of Social Gatherings with Friends and Family: Twice a week  . Attends Religious Services: Never  . Active Member of Clubs or Organizations: No  . Attends Archivist Meetings: Never  . Marital Status: Married    Tobacco Counseling Counseling given: Not Answered Comment: smoking cessation materials not required   Clinical Intake:  Pre-visit preparation completed: Yes  Pain : No/denies pain     Nutritional Risks: None Diabetes: Yes CBG done?: No Did pt. bring in CBG monitor from home?: No  How often do you need to have someone help you when you read instructions, pamphlets, or other written materials from your doctor or pharmacy?: 1 - Never  Nutrition Risk Assessment:  Has the patient had any N/V/D within the last 2 months?  No  Does the patient have any non-healing wounds?  No  Has the patient had any unintentional weight loss or weight gain?  No   Diabetes:  Is the patient diabetic?  Yes  If diabetic, was a CBG obtained today?  No  Did the patient bring in their glucometer from home?  No  How often do you monitor your CBG's? As needed per patient; needs new testing supplies.   Financial Strains and Diabetes Management:  Are you having any financial strains with the device, your supplies or your medication? No .  Does the patient want to be seen by Chronic Care Management for management of their diabetes?  Yes  - already enrolled  Would the patient like to be referred to a Nutritionist or for Diabetic Management?  No   Diabetic Exams:  Diabetic Eye Exam: Completed per patient, will request records from [facility].   Diabetic Foot Exam: Completed 07/31/18. Pt has been advised about the importance in  completing this exam. Pt is scheduled for diabetic foot exam on  - pt to schedule appt today at checkout.    Interpreter Needed?: No  Information entered by :: [name] LPN   Activities of Daily Living In your present state of health, do you have any difficulty performing the following activities: 05/07/2020  Hearing? N  Comment declines hearing aids  Vision? N  Difficulty concentrating or making decisions? N  Walking or climbing stairs? N  Dressing or bathing? N  Doing errands, shopping? N  Preparing Food and eating ? N  Using the Toilet? N  In the past six months, have you accidently leaked urine? N  Do you have problems with loss of bowel control? N  Managing your Medications? N  Managing your Finances? N  Housekeeping or managing your Housekeeping? N  Some recent data might be hidden    Patient Care  Team: Juline Patch, MD as PCP - General (Family Medicine) Vladimir Faster, [facility] (Pharmacist)  Indicate any recent Medical Services you may have received from other than [facility] providers in the past year (date may be approximate).     Assessment:   This is a routine wellness examination for [place].  Hearing/Vision screen  Hearing Screening   125Hz  250Hz  500Hz  1000Hz  2000Hz  3000Hz  4000Hz  6000Hz  8000Hz   Right ear:           Left ear:           Comments: Pt denies hearing difficulty   Vision Screening Comments: Annual [facility] screenings done by [facility]   Dietary issues and exercise activities discussed: Current Exercise Habits: Home exercise routine, Type of exercise: walking, Time (Minutes): 30, Frequency (Times/Week): 5, Weekly Exercise (Minutes/Week): 150, Intensity: Moderate, Exercise limited by: None identified  Goals    . DIET - INCREASE WATER INTAKE     Recommend to drink at least 6-8 8oz glasses of water per day.    . Patient Stated     Patient states he would like to maintain health with physical activity and healthy eating.     Marland Kitchen PharmD " I  want to stay healthy and live longer"     [place] (see longitudinal plan of care for additional care plan information)  Current Barriers:  . Chronic Disease Management support, education, and care coordination needs related to Hypertension,Hyperlipidemia and Diabetes.   Hypertension BP Readings from Last 3 Encounters:  10/29/19 (!) 130/78  07/13/19 (!) 151/91  06/28/19 138/78   . Pharmacist Clinical Goal(s): o Over the next 90 days, patient will work with PharmD and providers to maintain BP goal <130/80 . Current regimen:  . Metoprolol tartrate 50 mg bid . Lisinopril/hctz 20-12.5 mg daily . Amlodipine 10 mg daily . Interventions: .  Comprehensive medication review performed, medication list updated in electronic medical record . Confirmed patient  is now taking metoprolol tartrate twice daily. . Discussed the importance of home BP monitoring and proper technique   . Patient self care activities - Over the next 90 days, patient will: o Check BP 2-3 times weekly document, and provide at future appointments o Ensure daily salt intake < 2300 mg/day o Continue exercise regimen and healthy diet.  Hyperlipidemia Lab Results  Component Value Date/Time   LDLCALC 66 06/28/2019 03:28 PM   . Pharmacist Clinical Goal(s): o Over the next 90 days, patient will work with PharmD and providers to maintain LDL goal < 70 . Current regimen:  o Aspirin 81 mg EC daily o Atorvastatin 40 mg daily . Interventions: . Comprehensive medication review performed, medication list updated in electronic medical record . Provided diet and exercise counseling.  . Patient self care activities - Over the next 90 days, patient will: o Continue exercise regimen. Goal 30 min/day 5 days/week.  Diabetes Lab Results  Component Value Date/Time   HGBA1C 5.9 (H) 06/28/2019 03:28 PM   HGBA1C 5.5 01/31/2019 09:28 AM   . Pharmacist Clinical Goal(s): o Over the next 90 days, patient will work with PharmD  and providers to maintain A1c goal <7% . Current regimen:  Marland Kitchen Glipizide xl 2.5 mg qd . Metformin 1000mg  bid . Interventions: . Comprehensive medication review performed, medication list updated in electronic medical record o Discussed the signs/symptoms of hypoglycemia. o Reviewed goal glucose readings for an A1c of <7%, we want to see fasting sugars <130 and 2 hour after meal sugars <180.  Marland Kitchen  Patient self care activities - Over the next 90 days, patient will: o Check blood sugar once daily, document, and provide at future appointments o Contact provider with any episodes of hypoglycemia   Please see past updates related to this goal by clicking on the "Past Updates" button in the selected goal         Depression Screen PHQ 2/9 Scores 05/07/2020 10/29/2019 06/28/2019 05/07/2019 01/31/2019 07/31/2018 05/12/2018  PHQ - 2 Score 0 0 0 0 0 0 0  PHQ- 9 Score - 0 1 - 1 0 2    Fall Risk Fall Risk  05/07/2020 06/28/2019 05/07/2019 05/03/2018 04/28/2017  Falls in the past year? 0 1 1 1  No  Number falls in past yr: 0 1 1 1  -  Comment - - fell off step ladder in july, no injury - -  Injury with Fall? 0 1 0 0 -  Risk for fall due to : No Fall Risks Impaired balance/gait No Fall Risks - -  Follow up Falls prevention discussed Falls evaluation completed Falls prevention discussed Falls prevention discussed -    FALL RISK PREVENTION PERTAINING TO THE HOME:  Any stairs in or around the home? Yes  If so, are there any without handrails? No  Home free of loose throw rugs in walkways, pet beds, electrical cords, etc? Yes  Adequate lighting in your home to reduce risk of falls? Yes   ASSISTIVE DEVICES UTILIZED TO PREVENT FALLS:  Life alert? No  Use of a cane, walker or w/c? No  Grab bars in the bathroom? Yes  Shower chair or bench in shower? Yes  Elevated toilet seat or a handicapped toilet? No   TIMED UP AND GO:  Was the test performed? Yes .  Length of time to ambulate 10 feet: 4 sec.   Gait steady and  fast without use of assistive device  Cognitive Function: Normal cognitive status assessed by direct observation by this Nurse Health Advisor. No abnormalities found.       6CIT Screen 05/07/2019 05/03/2018 04/28/2017  What Year? 0 points 0 points 0 points  What month? 0 points 0 points 0 points  What time? 0 points 0 points 0 points  Count back from 20 0 points 0 points 0 points  Months in reverse 0 points 0 points 0 points  Repeat phrase 2 points 0 points 0 points  Total Score 2 0 0    Immunizations Immunization History  Administered Date(s) Administered  . Influenza, High Dose Seasonal PF 04/28/2017, 05/03/2018, 01/10/2020  . Influenza,inj,Quad PF,6+ Mos 02/17/2015, 12/27/2018  . Influenza-Unspecified 02/17/2015  . PFIZER(Purple Top)SARS-COV-2 Vaccination 05/19/2019, 06/09/2019, 01/07/2020  . Pneumococcal Conjugate-13 04/28/2017  . Pneumococcal Polysaccharide-23 05/03/2018  . Tdap 05/06/2017    TDAP status: Up to date  Flu Vaccine status: Up to date  Pneumococcal vaccine status: Completed during today's visit.  Covid-19 vaccine status: Completed vaccines  Qualifies for Shingles Vaccine? Yes   Zostavax completed No   Shingrix Completed?: No.    Education has been provided regarding the importance of this vaccine. Patient has been advised to call insurance company to determine out of pocket expense if they have not yet received this vaccine. Advised may also receive vaccine at local pharmacy or Health Dept. Verbalized acceptance and understanding.  Screening Tests Health Maintenance  Topic Date Due  . FOOT EXAM  07/31/2019  . HEMOGLOBIN A1C  12/28/2019  . Hepatitis C Screening  06/27/2020 (Originally Nov 29, 1950)  . TETANUS/TDAP  05/07/2027  . COLONOSCOPY (  Pts 45-32yrs Insurance coverage will need to be confirmed)  07/12/2029  . INFLUENZA VACCINE  Completed  . COVID-19 Vaccine  Completed  . PNA vac Low Risk Adult  Completed  . OPHTHALMOLOGY EXAM  Discontinued    Health  Maintenance  Health Maintenance Due  Topic Date Due  . FOOT EXAM  07/31/2019  . HEMOGLOBIN A1C  12/28/2019    Colorectal cancer screening: Type of screening: Colonoscopy. Completed 07/13/19. Repeat every 10 years  Lung Cancer Screening: (Low Dose CT Chest recommended if Age 58-80 years, 30 pack-year currently smoking OR have quit w/in 15years.) does not qualify.   Additional Screening:  Hepatitis C Screening: does qualify; postponed  Vision Screening: Recommended annual ophthalmology exams for early detection of glaucoma and other disorders of the eye. Is the patient up to date with their annual eye exam?  Yes  Who is the provider or what is the name of the office in which the patient attends annual eye exams? [place]  Dental Screening: Recommended annual dental exams for proper oral hygiene  Community Resource Referral / Chronic Care Management: CRR required this visit?  No   CCM required this visit?  No      Plan:     I have personally reviewed and noted the following in the patient's chart:   . Medical and social history . Use of alcohol, tobacco or illicit drugs  . Current medications and supplements . Functional ability and status . Nutritional status . Physical activity . Advanced directives . List of other physicians . Hospitalizations, surgeries, and ER visits in previous 12 months . Vitals . Screenings to include cognitive, depression, and falls . Referrals and appointments  In addition, I have reviewed and discussed with patient certain preventive protocols, quality metrics, and best practice recommendations. A written personalized care plan for preventive services as well as general preventive health recommendations were provided to patient.     [name], LPN   [id number]   Nurse Notes: pt seen in ED 05/02/20 for SOB; negative for covid. Pt states he had a "tickling cough" pt concerned possibly related to lisinopril but he has been on it for a  long time and doesn't feel the need to switch. Pt feeling much better today.   Pt requests new rx for diabetic testing supplies to have on hand. Pt reports his meter is approx 69 years old. Please send new rx for one touch meter and supplies to [facility] [place]. Thank you!

## 2020-05-07 NOTE — Patient Instructions (Signed)
[name] , Thank you for taking time to come for your Medicare Wellness Visit. I appreciate your ongoing commitment to your health goals. Please review the following plan we discussed and let me know if I can assist you in the future.   Screening recommendations/referrals: Colonoscopy: done 07/23/19 Recommended yearly ophthalmology/optometry visit for glaucoma screening and checkup Recommended yearly dental visit for hygiene and checkup  Vaccinations: Influenza vaccine: done 01/10/20 Pneumococcal vaccine: done 05/03/18 Tdap vaccine: done 05/06/17 Shingles vaccine: Shingrix discussed. Please contact your pharmacy for coverage information.   Covid-19: done 05/19/19, 06/09/19 & 01/07/20  Advanced directives: Advance directive discussed with you today. I have provided a copy for you to complete at home and have notarized. Once this is complete please bring a copy in to our office so we can scan it into your chart.  Conditions/risks identified: Keep up the great work!  Next appointment: Follow up in one year for your annual wellness visit.   Preventive Care 69 Years and Older, Male Preventive care refers to lifestyle choices and visits with your health care provider that can promote health and wellness. What does preventive care include?  A yearly physical exam. This is also called an annual well check.  Dental exams once or twice a year.  Routine eye exams. Ask your health care provider how often you should have your eyes checked.  Personal lifestyle choices, including:  Daily care of your teeth and gums.  Regular physical activity.  Eating a healthy diet.  Avoiding tobacco and drug use.  Limiting alcohol use.  Practicing safe sex.  Taking low doses of aspirin every day.  Taking vitamin and mineral supplements as recommended by your health care provider. What happens during an annual well check? The services and screenings done by your health care provider during your annual  well check will depend on your age, overall health, lifestyle risk factors, and family history of disease. Counseling  Your health care provider may ask you questions about your:  Alcohol use.  Tobacco use.  Drug use.  Emotional well-being.  Home and relationship well-being.  Sexual activity.  Eating habits.  History of falls.  Memory and ability to understand (cognition).  Work and work Statistician. Screening  You may have the following tests or measurements:  Height, weight, and BMI.  Blood pressure.  Lipid and cholesterol levels. These may be checked every 5 years, or more frequently if you are over 52 years old.  Skin check.  Lung cancer screening. You may have this screening every year starting at age 28 if you have a 30-pack-year history of smoking and currently smoke or have quit within the past 15 years.  Fecal occult blood test (FOBT) of the stool. You may have this test every year starting at age 8.  Flexible sigmoidoscopy or colonoscopy. You may have a sigmoidoscopy every 5 years or a colonoscopy every 10 years starting at age 23.  Prostate cancer screening. Recommendations will vary depending on your family history and other risks.  Hepatitis C blood test.  Hepatitis B blood test.  Sexually transmitted disease (STD) testing.  Diabetes screening. This is done by checking your blood sugar (glucose) after you have not eaten for a while (fasting). You may have this done every 1-3 years.  Abdominal aortic aneurysm (AAA) screening. You may need this if you are a current or former smoker.  Osteoporosis. You may be screened starting at age 29 if you are at high risk. Talk with your health care  provider about your test results, treatment options, and if necessary, the need for more tests. Vaccines  Your health care provider may recommend certain vaccines, such as:  Influenza vaccine. This is recommended every year.  Tetanus, diphtheria, and acellular  pertussis (Tdap, Td) vaccine. You may need a Td booster every 10 years.  Zoster vaccine. You may need this after age 4.  Pneumococcal 13-valent conjugate (PCV13) vaccine. One dose is recommended after age 46.  Pneumococcal polysaccharide (PPSV23) vaccine. One dose is recommended after age 82. Talk to your health care provider about which screenings and vaccines you need and how often you need them. This information is not intended to replace advice given to you by your health care provider. Make sure you discuss any questions you have with your health care provider. Document Released: 04/11/2015 Document Revised: 12/03/2015 Document Reviewed: 01/14/2015 Elsevier Interactive Patient Education  2017 [employer] Prevention in the Home Falls can cause injuries. They can happen to people of all ages. There are many things you can do to make your home safe and to help prevent falls. What can I do on the outside of my home?  Regularly fix the edges of walkways and driveways and fix any cracks.  Remove anything that might make you trip as you walk through a door, such as a raised step or threshold.  Trim any bushes or trees on the path to your home.  Use bright outdoor lighting.  Clear any walking paths of anything that might make someone trip, such as rocks or tools.  Regularly check to see if handrails are loose or broken. Make sure that both sides of any steps have handrails.  Any raised decks and porches should have guardrails on the edges.  Have any leaves, snow, or ice cleared regularly.  Use sand or salt on walking paths during winter.  Clean up any spills in your garage right away. This includes oil or grease spills. What can I do in the bathroom?  Use night lights.  Install grab bars by the toilet and in the tub and shower. Do not use towel bars as grab bars.  Use non-skid mats or decals in the tub or shower.  If you need to sit down in the shower, use a plastic,  non-slip stool.  Keep the floor dry. Clean up any water that spills on the floor as soon as it happens.  Remove soap buildup in the tub or shower regularly.  Attach bath mats securely with double-sided non-slip rug tape.  Do not have throw rugs and other things on the floor that can make you trip. What can I do in the bedroom?  Use night lights.  Make sure that you have a light by your bed that is easy to reach.  Do not use any sheets or blankets that are too big for your bed. They should not hang down onto the floor.  Have a firm chair that has side arms. You can use this for support while you get dressed.  Do not have throw rugs and other things on the floor that can make you trip. What can I do in the kitchen?  Clean up any spills right away.  Avoid walking on wet floors.  Keep items that you use a lot in easy-to-reach places.  If you need to reach something above you, use a strong step stool that has a grab bar.  Keep electrical cords out of the way.  Do not use floor polish  or wax that makes floors slippery. If you must use wax, use non-skid floor wax.  Do not have throw rugs and other things on the floor that can make you trip. What can I do with my stairs?  Do not leave any items on the stairs.  Make sure that there are handrails on both sides of the stairs and use them. Fix handrails that are broken or loose. Make sure that handrails are as long as the stairways.  Check any carpeting to make sure that it is firmly attached to the stairs. Fix any carpet that is loose or worn.  Avoid having throw rugs at the top or bottom of the stairs. If you do have throw rugs, attach them to the floor with carpet tape.  Make sure that you have a light switch at the top of the stairs and the bottom of the stairs. If you do not have them, ask someone to add them for you. What else can I do to help prevent falls?  Wear shoes that:  Do not have high heels.  Have rubber  bottoms.  Are comfortable and fit you well.  Are closed at the toe. Do not wear sandals.  If you use a stepladder:  Make sure that it is fully opened. Do not climb a closed stepladder.  Make sure that both sides of the stepladder are locked into place.  Ask someone to hold it for you, if possible.  Clearly mark and make sure that you can see:  Any grab bars or handrails.  First and last steps.  Where the edge of each step is.  Use tools that help you move around (mobility aids) if they are needed. These include:  Canes.  Walkers.  Scooters.  Crutches.  Turn on the lights when you go into a dark area. Replace any light bulbs as soon as they burn out.  Set up your furniture so you have a clear path. Avoid moving your furniture around.  If any of your floors are uneven, fix them.  If there are any pets around you, be aware of where they are.  Review your medicines with your doctor. Some medicines can make you feel dizzy. This can increase your chance of falling. Ask your doctor what other things that you can do to help prevent falls. This information is not intended to replace advice given to you by your health care provider. Make sure you discuss any questions you have with your health care provider. Document Released: 01/09/2009 Document Revised: 08/21/2015 Document Reviewed: 04/19/2014 Elsevier Interactive Patient Education  2017 [employer].

## 2020-05-08 ENCOUNTER — Telehealth: Payer: Self-pay | Admitting: Family Medicine

## 2020-05-08 NOTE — Telephone Encounter (Signed)
Patient returned call to Andrew [name] and would like for her to call him back. Please advise

## 2020-05-08 NOTE — Telephone Encounter (Signed)
Pt is coming in on 16th

## 2020-05-14 ENCOUNTER — Encounter: Payer: Self-pay | Admitting: Family Medicine

## 2020-05-14 ENCOUNTER — Other Ambulatory Visit: Payer: Self-pay

## 2020-05-14 ENCOUNTER — Ambulatory Visit (INDEPENDENT_AMBULATORY_CARE_PROVIDER_SITE_OTHER): Payer: PPO | Admitting: Family Medicine

## 2020-05-14 VITALS — BP 120/70 | HR 80 | Ht 73.0 in | Wt 182.0 lb

## 2020-05-14 DIAGNOSIS — F339 Major depressive disorder, recurrent, unspecified: Secondary | ICD-10-CM | POA: Diagnosis not present

## 2020-05-14 DIAGNOSIS — I10 Essential (primary) hypertension: Secondary | ICD-10-CM | POA: Diagnosis not present

## 2020-05-14 DIAGNOSIS — E7801 Familial hypercholesterolemia: Secondary | ICD-10-CM | POA: Diagnosis not present

## 2020-05-14 DIAGNOSIS — E119 Type 2 diabetes mellitus without complications: Secondary | ICD-10-CM

## 2020-05-14 MED ORDER — GLIPIZIDE ER 2.5 MG PO TB24: 2.5000 mg | ORAL_TABLET | Freq: Every day | ORAL | 1 refills | Status: DC

## 2020-05-14 MED ORDER — METFORMIN HCL 1000 MG PO TABS: 1000.0000 mg | ORAL_TABLET | Freq: Two times a day (BID) | ORAL | 1 refills | Status: DC

## 2020-05-14 MED ORDER — METOPROLOL TARTRATE 50 MG PO TABS: 50.0000 mg | ORAL_TABLET | Freq: Two times a day (BID) | ORAL | 1 refills | Status: DC

## 2020-05-14 MED ORDER — VENLAFAXINE HCL ER 150 MG PO CP24: 150.0000 mg | ORAL_CAPSULE | Freq: Every day | ORAL | 1 refills | Status: DC

## 2020-05-14 MED ORDER — AMLODIPINE BESYLATE 10 MG PO TABS: 10.0000 mg | ORAL_TABLET | Freq: Every day | ORAL | 1 refills | Status: DC

## 2020-05-14 MED ORDER — LISINOPRIL-HYDROCHLOROTHIAZIDE 20-12.5 MG PO TABS: 1.0000 | ORAL_TABLET | Freq: Every day | ORAL | 1 refills | Status: DC

## 2020-05-14 NOTE — Progress Notes (Signed)
Date:  05/14/2020   Name:  Andrew [name]   DOB:  Jul 05, 1950   MRN:  [id number]   Chief Complaint: Hyperlipidemia, Diabetes, Hypertension, and Depression (0 and 0)  Hyperlipidemia This is a chronic problem. The current episode started more than 1 year ago. The problem is controlled (uncertain). Recent lipid tests were reviewed and are variable. He has no history of chronic renal disease, diabetes, hypothyroidism, liver disease, obesity or nephrotic syndrome. There are no known factors aggravating his hyperlipidemia. Pertinent negatives include no chest pain, focal sensory loss, focal weakness, leg pain, myalgias or shortness of breath. Current antihyperlipidemic treatment includes diet change. The current treatment provides moderate improvement of lipids. There are no compliance problems.  Risk factors for coronary artery disease include hypertension and male sex.  Diabetes He has type 2 diabetes mellitus. His disease course has been stable. There are no hypoglycemic associated symptoms. Pertinent negatives for hypoglycemia include no dizziness, headaches, nervousness/anxiousness or sweats. Pertinent negatives for diabetes include no blurred vision, no chest pain, no fatigue, no foot paresthesias, no foot ulcerations, no polydipsia, no polyphagia, no polyuria, no visual change, no weakness and no weight loss. There are no hypoglycemic complications. Symptoms are stable. There are no diabetic complications. Pertinent negatives for diabetic complications include no CVA, PVD or retinopathy. Current diabetic treatment includes oral agent (dual therapy) (glypizide/metformen). He is compliant with treatment some of the time. His weight is stable (all my things are dead). He is following a generally healthy diet. Meal planning includes avoidance of concentrated sweets and carbohydrate counting. He participates in exercise intermittently. An ACE inhibitor/angiotensin II receptor blocker is being taken.   Hypertension This is a chronic problem. The current episode started more than 1 year ago. The problem has been gradually improving since onset. The problem is controlled. Pertinent negatives include no anxiety, blurred vision, chest pain, headaches, malaise/fatigue, neck pain, orthopnea, palpitations, peripheral edema, PND, shortness of breath or sweats. There are no associated agents to hypertension. Risk factors for coronary artery disease include dyslipidemia. Past treatments include calcium channel blockers, diuretics, ACE inhibitors and beta blockers. The current treatment provides moderate improvement. There are no compliance problems.  There is no history of angina, kidney disease, CAD/MI, CVA, heart failure, left ventricular hypertrophy, PVD or retinopathy. There is no history of chronic renal disease, a hypertension causing med or renovascular disease.  Depression        This is a chronic problem.  The current episode started more than 1 year ago.   The onset quality is gradual.   The problem has been gradually improving since onset.  Associated symptoms include no decreased concentration, no fatigue, no helplessness, no hopelessness, does not have insomnia, not irritable, no restlessness, no decreased interest, no appetite change, no body aches, no myalgias, no headaches, no indigestion, not sad and no suicidal ideas.     The symptoms are aggravated by nothing.  Past treatments include SSRIs - Selective serotonin reuptake inhibitors.  Compliance with treatment is variable.  Previous treatment provided mild relief.   Pertinent negatives include no hypothyroidism and no anxiety.   Lab Results  Component Value Date   CREATININE 1.00 05/02/2020   BUN 20 05/02/2020   NA 143 05/02/2020   K 3.5 05/02/2020   CL 99 05/02/2020   CO2 30 05/02/2020   Lab Results  Component Value Date   CHOL 144 06/28/2019   HDL 57 06/28/2019   LDLCALC 66 06/28/2019   TRIG 116 06/28/2019  CHOLHDL 3.3 07/31/2018    No results found for: TSH Lab Results  Component Value Date   HGBA1C 5.9 (H) 06/28/2019   Lab Results  Component Value Date   WBC 6.2 05/02/2020   HGB 16.1 05/02/2020   HCT 46.5 05/02/2020   MCV 95.1 05/02/2020   PLT 300 05/02/2020   No results found for: ALT, AST, GGT, ALKPHOS, BILITOT   Review of Systems  Constitutional: Negative for appetite change, chills, fatigue, fever, malaise/fatigue and weight loss.  HENT: Negative for drooling, ear discharge, ear pain and sore throat.   Eyes: Negative for blurred vision.  Respiratory: Negative for cough, shortness of breath and wheezing.   Cardiovascular: Negative for chest pain, palpitations, orthopnea, leg swelling and PND.  Gastrointestinal: Negative for abdominal pain, blood in stool, constipation, diarrhea and nausea.  Endocrine: Negative for polydipsia, polyphagia and polyuria.  Genitourinary: Negative for dysuria, frequency, hematuria and urgency.  Musculoskeletal: Negative for back pain, myalgias and neck pain.  Skin: Negative for rash.  Allergic/Immunologic: Negative for environmental allergies.  Neurological: Negative for dizziness, focal weakness, weakness and headaches.  Hematological: Does not bruise/bleed easily.  Psychiatric/Behavioral: Positive for depression. Negative for decreased concentration and suicidal ideas. The patient is not nervous/anxious and does not have insomnia.     Patient Active Problem List   Diagnosis Date Noted  . Aortic atherosclerosis ([place]) 05/02/2020  . Screening for colon cancer   . Aortic dissection proximal to innominate ([place]) 12/23/2018  . Mixed hyperlipidemia 10/05/2016  . Familial multiple lipoprotein-type hyperlipidemia 08/02/2014  . Routine general medical examination at a health care facility 08/02/2014  . Recurrent major depressive episodes ([place]) 08/02/2014  . Essential (primary) hypertension 08/02/2014  . Overweight 08/02/2014  . Diabetes mellitus, type 2 ([place]) 08/02/2014     No Known Allergies  Past Surgical History:  Procedure Laterality Date  . COLONOSCOPY WITH PROPOFOL N/A 07/13/2019   Procedure: COLONOSCOPY WITH PROPOFOL;  Surgeon: [name], MD;  Location: [facility];  Service: Endoscopy;  Laterality: N/A;  . NASAL SINUS SURGERY    . REPAIR OF ACUTE ASCENDING THORACIC AORTIC DISSECTION  12/2018    Social History   Tobacco Use  . Smoking status: Never Smoker  . Smokeless tobacco: Never Used  . Tobacco comment: smoking cessation materials not required  Vaping Use  . Vaping Use: Never used  Substance Use Topics  . Alcohol use: Yes    Alcohol/week: 5.0 standard drinks    Types: 2 Glasses of wine, 3 Cans of beer per week    Comment: weekly  . Drug use: No     Medication list has been reviewed and updated.  Current Meds  Medication Sig  . amLODipine (NORVASC) 10 MG tablet Take 1 tablet (10 mg total) by mouth daily.  Marland Kitchen aspirin 81 MG EC tablet Take 1 tablet by mouth daily.   Marland Kitchen atorvastatin (LIPITOR) 40 MG tablet Take 1 tablet (40 mg total) by mouth daily.  Marland Kitchen glipiZIDE (GLUCOTROL XL) 2.5 MG 24 hr tablet Take 1 tablet (2.5 mg total) by mouth daily with breakfast.  . glucose blood test strip Use as instructed  . lisinopril-hydrochlorothiazide (ZESTORETIC) 20-12.5 MG tablet Take 1 tablet by mouth daily.  . metFORMIN (GLUCOPHAGE) 1000 MG tablet Take 1 tablet (1,000 mg total) by mouth 2 (two) times daily.  . metoprolol tartrate (LOPRESSOR) 50 MG tablet Take 1 tablet (50 mg total) by mouth 2 (two) times daily.  Marland Kitchen venlafaxine XR (EFFEXOR-XR) 150 MG 24 hr capsule Take 1 capsule (150  mg total) by mouth daily with breakfast.    PHQ 2/9 Scores 05/14/2020 05/07/2020 10/29/2019 06/28/2019  PHQ - 2 Score 0 0 0 0  PHQ- 9 Score 0 - 0 1    GAD 7 : Generalized Anxiety Score 05/14/2020 10/29/2019 06/28/2019 01/31/2019  Nervous, Anxious, on Edge 0 0 0 0  Control/stop worrying 0 0 0 0  Worry too much - different things 0 0 0 0  Trouble relaxing 0 0 0 0   Restless 0 0 0 0  Easily annoyed or irritable 0 0 0 0  Afraid - awful might happen 0 0 0 0  Total GAD 7 Score 0 0 0 0  Anxiety Difficulty - Not difficult at all - -    BP Readings from Last 3 Encounters:  05/14/20 120/70  05/02/20 (!) 147/93  10/29/19 (!) 130/78    Physical Exam Vitals and nursing note reviewed.  Constitutional:      General: He is not irritable. HENT:     Head: Normocephalic.     Right Ear: Tympanic membrane, ear canal and external ear normal. There is no impacted cerumen.     Left Ear: Tympanic membrane, ear canal and external ear normal. There is no impacted cerumen.     Nose: Nose normal. No congestion or rhinorrhea.     Mouth/Throat:     Mouth: Oropharynx is clear and moist. Mucous membranes are moist.  Eyes:     General: No scleral icterus.       Right eye: No discharge.        Left eye: No discharge.     Extraocular Movements: Extraocular movements intact and EOM normal.     Conjunctiva/sclera: Conjunctivae normal.     Pupils: Pupils are equal, round, and reactive to light.  Neck:     Thyroid: No thyromegaly.     Vascular: No JVD.     Trachea: No tracheal deviation.  Cardiovascular:     Rate and Rhythm: Normal rate and regular rhythm.     Chest Wall: PMI is not displaced. No thrill.     Pulses: Normal pulses and intact distal pulses.          Dorsalis pedis pulses are 2+ on the right side and 2+ on the left side.       Posterior tibial pulses are 2+ on the right side and 2+ on the left side.     Heart sounds: Normal heart sounds, S1 normal and S2 normal. No murmur heard.  No systolic murmur is present.  No diastolic murmur is present. No friction rub. No gallop. No S3 or S4 sounds.   Pulmonary:     Effort: No respiratory distress.     Breath sounds: Normal breath sounds. No decreased breath sounds, wheezing, rhonchi or rales.  Abdominal:     General: Bowel sounds are normal.     Palpations: Abdomen is soft. There is no hepatosplenomegaly or  mass.     Tenderness: There is no abdominal tenderness. There is no CVA tenderness, guarding or rebound.  Musculoskeletal:        General: No tenderness or edema. Normal range of motion.     Cervical back: Normal range of motion and neck supple.     Right lower leg: No edema.     Left lower leg: No edema.  Feet:     Right foot:     Protective Sensation: 10 sites tested. 10 sites sensed.     Skin integrity: Skin integrity  normal. No ulcer, blister, skin breakdown, erythema, warmth, callus, dry skin or fissure.     Toenail Condition: Right toenails are abnormally thick.     Left foot:     Protective Sensation: 10 sites tested. 10 sites sensed.     Skin integrity: Skin integrity normal. No ulcer, blister, skin breakdown, erythema, warmth, callus, dry skin or fissure.     Toenail Condition: Left toenails are normal.  Lymphadenopathy:     Cervical: No cervical adenopathy.  Skin:    General: Skin is warm.     Coloration: Skin is not jaundiced or pale.     Findings: No bruising, erythema or rash.  Neurological:     Mental Status: He is alert and oriented to person, place, and time.     Cranial Nerves: No cranial nerve deficit.     Motor: No weakness.     Deep Tendon Reflexes: Strength normal and reflexes are normal and symmetric.     Wt Readings from Last 3 Encounters:  05/14/20 182 lb (82.6 kg)  05/02/20 180 lb (81.6 kg)  10/29/19 182 lb (82.6 kg)    BP 120/70   Pulse 80   Ht 6\' 1"  (1.854 m)   Wt 182 lb (82.6 kg)   BMI 24.01 kg/m   Assessment and Plan: 1. Type 2 diabetes mellitus without complication, without long-term current use of insulin (HCC) Chronic.  Controlled.  Stable.  Likely to continue his glipizide 2.5 mg XL once a day and Metformin 1 g twice a day.  Will check A1c as well as microalbuminuria today. - glipiZIDE (GLUCOTROL XL) 2.5 MG 24 hr tablet; Take 1 tablet (2.5 mg total) by mouth daily with breakfast.  Dispense: 90 tablet; Refill: 1 - metFORMIN (GLUCOPHAGE)  1000 MG tablet; Take 1 tablet (1,000 mg total) by mouth 2 (two) times daily.  Dispense: 180 tablet; Refill: 1 - HgB A1c - Microalbumin, urine  2. Essential (primary) hypertension Chronic.  Controlled.  Stable.  Blood pressure is excellent at 120/70.  We will continue amlodipine 10 mg once a day, lisinopril hydrochlorothiazide 20-12 0.5 mg once a day and metoprolol 50 mg one twice a day.  Will recheck in 6 months - amLODipine (NORVASC) 10 MG tablet; Take 1 tablet (10 mg total) by mouth daily.  Dispense: 90 tablet; Refill: 1 - lisinopril-hydrochlorothiazide (ZESTORETIC) 20-12.5 MG tablet; Take 1 tablet by mouth daily.  Dispense: 90 tablet; Refill: 1 - metoprolol tartrate (LOPRESSOR) 50 MG tablet; Take 1 tablet (50 mg total) by mouth 2 (two) times daily.  Dispense: 180 tablet; Refill: 1  3. Familial hypercholesterolemia Chronic.  Uncertain on control because patient has stopped his atorvastatin due to cramps at night.  I told him that nocturnal cramps is not the same as myalgias and that we will recheck this if there is a significant elevation we will consider Zetia which has no myalgias associated with it.  Pending will be a lipid panel. - Lipid Panel With LDL/HDL Ratio  4. Recurrent major depressive episodes (HCC) Chronic.  Controlled.  Stable.  PHQ zero gad score 0 continue venlafaxine XR 150 mg once a day.  Will recheck in 6 months. - venlafaxine XR (EFFEXOR-XR) 150 MG 24 hr capsule; Take 1 capsule (150 mg total) by mouth daily with breakfast.  Dispense: 90 capsule; Refill: 1

## 2020-05-14 NOTE — Patient Instructions (Signed)

## 2020-05-15 LAB — LIPID PANEL WITH LDL/HDL RATIO
Cholesterol, Total: 131 mg/dL (ref 100–199)
HDL: 45 mg/dL (ref >39)
LDL Chol Calc (NIH): 65 mg/dL (ref 0–99)
LDL/HDL Ratio: 1.4 ratio (ref 0.0–3.6)
Triglycerides: 119 mg/dL (ref 0–149)
VLDL Cholesterol Cal: 21 mg/dL (ref 5–40)

## 2020-05-15 LAB — HEMOGLOBIN A1C
Est. average glucose Bld gHb Est-mCnc: 123 mg/dL
Hgb A1c MFr Bld: 5.9 % — ABNORMAL HIGH (ref 4.8–5.6)

## 2020-05-15 LAB — MICROALBUMIN, URINE: Microalbumin, Urine: 387.1 ug/mL

## 2020-05-23 ENCOUNTER — Telehealth: Payer: Self-pay

## 2020-05-23 NOTE — Telephone Encounter (Signed)
Copied from [place] [id number] [id number]. Topic: General - Other >> May 23, 2020 11:43 AM Leward Quan A wrote: Reason for CRM: Patient called in requesting to speak to Baxter Flattery about test results please call Ph# [phone]

## 2020-05-23 NOTE — Telephone Encounter (Signed)
Called with labs

## 2020-06-08 DIAGNOSIS — Z7982 Long term (current) use of aspirin: Secondary | ICD-10-CM | POA: Diagnosis not present

## 2020-06-08 DIAGNOSIS — R000 Tachycardia, unspecified: Secondary | ICD-10-CM | POA: Diagnosis not present

## 2020-06-08 DIAGNOSIS — R413 Other amnesia: Secondary | ICD-10-CM | POA: Diagnosis not present

## 2020-06-08 DIAGNOSIS — Z0189 Encounter for other specified special examinations: Secondary | ICD-10-CM | POA: Diagnosis not present

## 2020-06-08 DIAGNOSIS — I10 Essential (primary) hypertension: Secondary | ICD-10-CM | POA: Diagnosis not present

## 2020-06-08 DIAGNOSIS — I361 Nonrheumatic tricuspid (valve) insufficiency: Secondary | ICD-10-CM | POA: Diagnosis not present

## 2020-06-08 DIAGNOSIS — E119 Type 2 diabetes mellitus without complications: Secondary | ICD-10-CM | POA: Diagnosis not present

## 2020-06-08 DIAGNOSIS — R4182 Altered mental status, unspecified: Secondary | ICD-10-CM | POA: Diagnosis not present

## 2020-06-08 DIAGNOSIS — G459 Transient cerebral ischemic attack, unspecified: Secondary | ICD-10-CM | POA: Diagnosis not present

## 2020-06-08 DIAGNOSIS — Z85828 Personal history of other malignant neoplasm of skin: Secondary | ICD-10-CM | POA: Diagnosis not present

## 2020-06-08 DIAGNOSIS — M542 Cervicalgia: Secondary | ICD-10-CM | POA: Diagnosis not present

## 2020-06-08 DIAGNOSIS — Z833 Family history of diabetes mellitus: Secondary | ICD-10-CM | POA: Diagnosis not present

## 2020-06-08 DIAGNOSIS — E1165 Type 2 diabetes mellitus with hyperglycemia: Secondary | ICD-10-CM | POA: Diagnosis not present

## 2020-06-08 DIAGNOSIS — I341 Nonrheumatic mitral (valve) prolapse: Secondary | ICD-10-CM | POA: Diagnosis not present

## 2020-06-08 DIAGNOSIS — S060X0A Concussion without loss of consciousness, initial encounter: Secondary | ICD-10-CM | POA: Diagnosis not present

## 2020-06-08 DIAGNOSIS — S199XXA Unspecified injury of neck, initial encounter: Secondary | ICD-10-CM | POA: Diagnosis not present

## 2020-06-08 DIAGNOSIS — S069X0A Unspecified intracranial injury without loss of consciousness, initial encounter: Secondary | ICD-10-CM | POA: Diagnosis not present

## 2020-06-08 DIAGNOSIS — Z823 Family history of stroke: Secondary | ICD-10-CM | POA: Diagnosis not present

## 2020-06-08 DIAGNOSIS — I711 Thoracic aortic aneurysm, ruptured: Secondary | ICD-10-CM | POA: Diagnosis not present

## 2020-06-08 DIAGNOSIS — R29810 Facial weakness: Secondary | ICD-10-CM | POA: Diagnosis not present

## 2020-06-08 DIAGNOSIS — R55 Syncope and collapse: Secondary | ICD-10-CM | POA: Diagnosis not present

## 2020-06-08 DIAGNOSIS — E785 Hyperlipidemia, unspecified: Secondary | ICD-10-CM | POA: Diagnosis not present

## 2020-06-08 DIAGNOSIS — I679 Cerebrovascular disease, unspecified: Secondary | ICD-10-CM | POA: Diagnosis not present

## 2020-06-08 DIAGNOSIS — I712 Thoracic aortic aneurysm, without rupture: Secondary | ICD-10-CM | POA: Diagnosis not present

## 2020-06-08 DIAGNOSIS — G9341 Metabolic encephalopathy: Secondary | ICD-10-CM | POA: Diagnosis not present

## 2020-06-08 DIAGNOSIS — R001 Bradycardia, unspecified: Secondary | ICD-10-CM | POA: Diagnosis not present

## 2020-06-08 DIAGNOSIS — S0990XA Unspecified injury of head, initial encounter: Secondary | ICD-10-CM | POA: Diagnosis not present

## 2020-06-08 DIAGNOSIS — Z7984 Long term (current) use of oral hypoglycemic drugs: Secondary | ICD-10-CM | POA: Diagnosis not present

## 2020-06-08 DIAGNOSIS — R451 Restlessness and agitation: Secondary | ICD-10-CM | POA: Diagnosis not present

## 2020-06-10 ENCOUNTER — Telehealth: Payer: PPO

## 2020-06-10 NOTE — Progress Notes (Deleted)
Chronic Care Management Pharmacy Note  06/10/2020 Name:  Andrew [name] MRN:  [id number] DOB:  24-Aug-1950  Subjective: Andrew [name] is an 70 y.o. year old male who is a primary patient of Juline Patch, MD.  The CCM team was consulted for assistance with disease management and care coordination needs.    Engaged with patient by telephone for follow up visit in response to provider referral for pharmacy case management and/or care coordination services.   Consent to Services:  The patient was given information about Chronic Care Management services, agreed to services, and gave verbal consent prior to initiation of services.  Please see initial visit note for detailed documentation.   Patient Care Team: Juline Patch, MD as PCP - General (Family Medicine) Vladimir Faster, [facility] (Pharmacist)  Recent office visits: 05/14/20- Ronnald Ramp ([place] work  Recent consult visits: [facility] visits: 05/02/20- ED DOB- EKG normal. Pct normal, COVID neg, troponin nomral  Objective:  Lab Results  Component Value Date   CREATININE 1.00 05/02/2020   BUN 20 05/02/2020   GFRNONAA >60 05/02/2020   GFRAA 101 06/28/2019   NA 143 05/02/2020   K 3.5 05/02/2020   CALCIUM 9.5 05/02/2020   CO2 30 05/02/2020    Lab Results  Component Value Date/Time   HGBA1C 5.9 (H) 05/14/2020 11:11 AM   HGBA1C 5.9 (H) 06/28/2019 03:28 PM    Last diabetic Eye exam: No results found for: HMDIABEYEEXA  Last diabetic Foot exam: No results found for: HMDIABFOOTEX   Lab Results  Component Value Date   CHOL 131 05/14/2020   HDL 45 05/14/2020   LDLCALC 65 05/14/2020   TRIG 119 05/14/2020   CHOLHDL 3.3 07/31/2018    Hepatic Function Latest Ref Rng & Units 06/28/2019 01/31/2019 07/31/2018  Albumin 3.8 - 4.8 g/dL 4.9(H) 4.7 4.5    No results found for: TSH, FREET4  CBC Latest Ref Rng & Units 05/02/2020 12/22/2018  WBC 4.0 - 10.5 K/uL 6.2 10.8(H)  Hemoglobin 13.0 - 17.0 g/dL 16.1 14.9  Hematocrit 39.0 -  52.0 % 46.5 43.6  Platelets 150 - 400 K/uL 300 293    No results found for: VD25OH  Clinical ASCVD: Yes  The 10-year ASCVD risk score Mikey Bussing DC Jr., et al., 2013) is: 26.6%   Values used to calculate the score:     Age: 89 years     Sex: Male     Is Non-Hispanic African American: No     Diabetic: Yes     Tobacco smoker: No     Systolic Blood Pressure: 416 mmHg     Is BP treated: Yes     HDL Cholesterol: 45 mg/dL     Total Cholesterol: 131 mg/dL    Depression screen [facility] 2/9 05/14/2020 05/07/2020 10/29/2019  Decreased Interest 0 0 0  Down, Depressed, Hopeless 0 0 0  PHQ - 2 Score 0 0 0  Altered sleeping 0 - 0  Tired, decreased energy 0 - 0  Change in appetite 0 - 0  Feeling bad or failure about yourself  0 - 0  Trouble concentrating 0 - 0  Moving slowly or fidgety/restless 0 - 0  Suicidal thoughts 0 - 0  PHQ-9 Score 0 - 0  Difficult doing work/chores - - Not difficult at all  Some recent data might be hidden      Social History   Tobacco Use  Smoking Status Never Smoker  Smokeless Tobacco Never Used  Tobacco Comment  smoking cessation materials not required   BP Readings from Last 3 Encounters:  05/14/20 120/70  05/02/20 (!) 147/93  10/29/19 (!) 130/78   Pulse Readings from Last 3 Encounters:  05/14/20 80  05/02/20 (!) 49  10/29/19 76   Wt Readings from Last 3 Encounters:  05/14/20 182 lb (82.6 kg)  05/02/20 180 lb (81.6 kg)  10/29/19 182 lb (82.6 kg)    Assessment/Interventions: Review of patient past medical history, allergies, medications, health status, including review of consultants reports, laboratory and other test data, was performed as part of comprehensive evaluation and provision of chronic care management services.   SDOH:  (Social Determinants of Health) assessments and interventions performed: No    Immunization History  Administered Date(s) Administered  . Influenza, High Dose Seasonal PF 04/28/2017, 05/03/2018, 01/10/2020  .  Influenza,inj,Quad PF,6+ Mos 02/17/2015, 12/27/2018  . Influenza-Unspecified 02/17/2015  . PFIZER(Purple Top)SARS-COV-2 Vaccination 05/19/2019, 06/09/2019, 01/07/2020  . Pneumococcal Conjugate-13 04/28/2017  . Pneumococcal Polysaccharide-23 05/03/2018  . Tdap 05/06/2017    Conditions to be addressed/monitored:  Hypertension, Hyperlipidemia, Diabetes, Coronary Artery Disease and Depression  There are no care plans that you recently modified to display for this patient.    Medication Assistance: None required.  Patient affirms current coverage meets needs.  Patient's preferred pharmacy is:  [id number] - [place], [address] [place] [address] Phone: [phone] Fax: [phone]  Uses pill box? {Yes or If no, why not?:20788} Pt endorses ***% compliance  We discussed: {Pharmacy options:24294} Patient decided to: {US Pharmacy JOIN:86767}  Care Plan and Follow Up Patient Decision:  {FOLLOWUP:24991}  Plan: Telephone follow up appointment with care management team member scheduled for:  3 months  Junita Push. Kenton Kingfisher PharmD, [facility] [phone]

## 2020-06-11 ENCOUNTER — Telehealth: Payer: Self-pay

## 2020-06-11 NOTE — Telephone Encounter (Unsigned)
Copied from [place] [id number]. Topic: General - Other >> Jun 11, 2020  3:10 PM Celene Kras wrote: Reason for CRM: Pts wife called and is requesting to speak with Baxter Flattery. She states that the pt has fallen and has a severe concussion. She states that they are on vacation right now and is requesting to have referrals for when they get back for neurologist and cardiologist. Please advise.

## 2020-06-12 ENCOUNTER — Telehealth: Payer: Self-pay | Admitting: Pharmacist

## 2020-06-12 NOTE — Telephone Encounter (Signed)
    Chronic Care Management   Outreach Note  06/12/2020 Name: Andrew [name] MRN: [id number] DOB: 1950-05-19  Referred by: Juline Patch, MD Reason for referral : Chronic Care Management   An unsuccessful telephone outreach was attempted today. The patient was referred to the pharmacist for assistance with care management and care coordination.   Follow Up Plan: Will ask CPA to outreach and reschedule with me.  Junita Push. Kenton Kingfisher PharmD, [facility] [phone]

## 2020-06-12 NOTE — Telephone Encounter (Signed)
See if we have an appt for Monday and call pt please

## 2020-06-12 NOTE — Telephone Encounter (Signed)
Please call wife and explain that he will need to be seen by appt here for the follow up concussion and then we can put referrals in.

## 2020-06-12 NOTE — Telephone Encounter (Signed)
Left message

## 2020-06-12 NOTE — Telephone Encounter (Signed)
Pt wife is calling tara back and her husband needs post hospital follow on Monday 06-16-2020. Pt is still out of town

## 2020-06-13 NOTE — Telephone Encounter (Signed)
I, [name], MD, have reviewed all documentation for this visit. The documentation on 06/13/20 for the exam, diagnosis, procedures, and orders are all accurate and complete.

## 2020-06-16 ENCOUNTER — Other Ambulatory Visit: Payer: Self-pay

## 2020-06-16 ENCOUNTER — Ambulatory Visit (INDEPENDENT_AMBULATORY_CARE_PROVIDER_SITE_OTHER): Payer: PPO | Admitting: Family Medicine

## 2020-06-16 ENCOUNTER — Encounter: Payer: Self-pay | Admitting: Family Medicine

## 2020-06-16 VITALS — BP 128/74 | HR 70 | Ht 73.0 in | Wt 186.0 lb

## 2020-06-16 DIAGNOSIS — Z09 Encounter for follow-up examination after completed treatment for conditions other than malignant neoplasm: Secondary | ICD-10-CM | POA: Diagnosis not present

## 2020-06-16 DIAGNOSIS — R55 Syncope and collapse: Secondary | ICD-10-CM

## 2020-06-16 DIAGNOSIS — S0990XD Unspecified injury of head, subsequent encounter: Secondary | ICD-10-CM | POA: Diagnosis not present

## 2020-06-16 DIAGNOSIS — F0781 Postconcussional syndrome: Secondary | ICD-10-CM | POA: Diagnosis not present

## 2020-06-16 DIAGNOSIS — S7012XD Contusion of left thigh, subsequent encounter: Secondary | ICD-10-CM

## 2020-06-16 NOTE — Progress Notes (Signed)
Date:  06/16/2020   Name:  Andrew [name]   DOB:  07/27/1950   MRN:  [id number]   Chief Complaint: Follow-up (Had TIA in [place]- was walking back from the beach and steps were ahead. He last remembers approaching steps and was out. Andrew [name] found him at the bottom of steps. Has goose egg on L) thigh that hurts when he tries to sleep at night. Bruise around L) eye and scrape on top of forehead indicating he hit his head. Andrew [name] is in the room- pt has given the "okay" for her to be in here.)  Loss of Consciousness This is a new problem. The current episode started in the past 7 days (8 days). The problem occurs daily. The problem has been waxing and waning. He lost consciousness for a period of greater than 5 minutes. The symptoms are aggravated by climbing stairs (bending over). Associated symptoms include clumsiness, confusion, dizziness, light-headedness, slurred speech and vertigo. Pertinent negatives include no abdominal pain, auditory change, aura, back pain, bladder incontinence, bowel incontinence, chest pain, diaphoresis, fever, focal sensory loss, focal weakness, headaches, malaise/fatigue, nausea, palpitations, visual change, vomiting or weakness.    Lab Results  Component Value Date   CREATININE 1.00 05/02/2020   BUN 20 05/02/2020   NA 143 05/02/2020   K 3.5 05/02/2020   CL 99 05/02/2020   CO2 30 05/02/2020   Lab Results  Component Value Date   CHOL 131 05/14/2020   HDL 45 05/14/2020   LDLCALC 65 05/14/2020   TRIG 119 05/14/2020   CHOLHDL 3.3 07/31/2018   No results found for: TSH Lab Results  Component Value Date   HGBA1C 5.9 (H) 05/14/2020   Lab Results  Component Value Date   WBC 6.2 05/02/2020   HGB 16.1 05/02/2020   HCT 46.5 05/02/2020   MCV 95.1 05/02/2020   PLT 300 05/02/2020   No results found for: ALT, AST, GGT, ALKPHOS, BILITOT   Review of Systems  Constitutional: Negative for chills, diaphoresis, fever and malaise/fatigue.  HENT: Negative for  drooling, ear discharge, ear pain and sore throat.   Respiratory: Negative for cough, shortness of breath and wheezing.   Cardiovascular: Positive for syncope. Negative for chest pain, palpitations and leg swelling.  Gastrointestinal: Negative for abdominal pain, blood in stool, bowel incontinence, constipation, diarrhea, nausea and vomiting.  Endocrine: Negative for polydipsia.  Genitourinary: Negative for bladder incontinence, dysuria, frequency, hematuria and urgency.  Musculoskeletal: Negative for back pain, myalgias and neck pain.  Skin: Negative for rash.  Allergic/Immunologic: Negative for environmental allergies.  Neurological: Positive for dizziness, vertigo and light-headedness. Negative for focal weakness, weakness and headaches.  Hematological: Does not bruise/bleed easily.  Psychiatric/Behavioral: Positive for confusion. Negative for suicidal ideas. The patient is not nervous/anxious.     Patient Active Problem List   Diagnosis Date Noted  . Aortic atherosclerosis ([place]) 05/02/2020  . Screening for colon cancer   . Aortic dissection proximal to innominate ([place]) 12/23/2018  . Mixed hyperlipidemia 10/05/2016  . Familial multiple lipoprotein-type hyperlipidemia 08/02/2014  . Routine general medical examination at a health care facility 08/02/2014  . Recurrent major depressive episodes ([place]) 08/02/2014  . Essential (primary) hypertension 08/02/2014  . Overweight 08/02/2014  . Diabetes mellitus, type 2 ([place]) 08/02/2014    No Known Allergies  Past Surgical History:  Procedure Laterality Date  . COLONOSCOPY WITH PROPOFOL N/A 07/13/2019   Procedure: COLONOSCOPY WITH PROPOFOL;  Surgeon: [name], MD;  Location[facility] ENDOSCOPY;  Service: Endoscopy;  Laterality: N/A;  .  NASAL SINUS SURGERY    . REPAIR OF ACUTE ASCENDING THORACIC AORTIC DISSECTION  12/2018    Social History   Tobacco Use  . Smoking status: Never Smoker  . Smokeless tobacco: Never Used  . Tobacco comment:  smoking cessation materials not required  Vaping Use  . Vaping Use: Never used  Substance Use Topics  . Alcohol use: Yes    Alcohol/week: 5.0 standard drinks    Types: 2 Glasses of wine, 3 Cans of beer per week    Comment: weekly  . Drug use: No     Medication list has been reviewed and updated.  Current Meds  Medication Sig  . amLODipine (NORVASC) 10 MG tablet Take 1 tablet (10 mg total) by mouth daily.  Marland Kitchen atorvastatin (LIPITOR) 40 MG tablet Take 1 tablet (40 mg total) by mouth daily.  . clopidogrel (PLAVIX) 75 MG tablet Take 75 mg by mouth every morning.  Marland Kitchen glipiZIDE (GLUCOTROL XL) 2.5 MG 24 hr tablet Take 1 tablet (2.5 mg total) by mouth daily with breakfast.  . glucose blood test strip Use as instructed  . lisinopril-hydrochlorothiazide (ZESTORETIC) 20-12.5 MG tablet Take 1 tablet by mouth daily.  . metFORMIN (GLUCOPHAGE) 1000 MG tablet Take 1 tablet (1,000 mg total) by mouth 2 (two) times daily.  Marland Kitchen venlafaxine XR (EFFEXOR-XR) 150 MG 24 hr capsule Take 1 capsule (150 mg total) by mouth daily with breakfast.  . [DISCONTINUED] aspirin 81 MG EC tablet Take 1 tablet by mouth daily.     PHQ 2/9 Scores 06/16/2020 05/14/2020 05/07/2020 10/29/2019  PHQ - 2 Score 0 0 0 0  PHQ- 9 Score 0 0 - 0    GAD 7 : Generalized Anxiety Score 06/16/2020 05/14/2020 10/29/2019 06/28/2019  Nervous, Anxious, on Edge 0 0 0 0  Control/stop worrying 0 0 0 0  Worry too much - different things 0 0 0 0  Trouble relaxing 1 0 0 0  Restless 0 0 0 0  Easily annoyed or irritable 1 0 0 0  Afraid - awful might happen 0 0 0 0  Total GAD 7 Score 2 0 0 0  Anxiety Difficulty Somewhat difficult - Not difficult at all -    BP Readings from Last 3 Encounters:  06/16/20 128/74  05/14/20 120/70  05/02/20 (!) 147/93    Physical Exam Vitals and nursing note reviewed.  HENT:     Head: Normocephalic.     Right Ear: Tympanic membrane and external ear normal.     Left Ear: Tympanic membrane and external ear normal.      Nose: Nose normal. No congestion or rhinorrhea.     Mouth/Throat:     Mouth: Mucous membranes are moist.  Eyes:     General: No scleral icterus.       Right eye: No discharge.        Left eye: No discharge.     Conjunctiva/sclera: Conjunctivae normal.     Pupils: Pupils are equal, round, and reactive to light.  Neck:     Thyroid: No thyromegaly.     Vascular: No JVD.     Trachea: No tracheal deviation.  Cardiovascular:     Rate and Rhythm: Normal rate and regular rhythm.     Heart sounds: Normal heart sounds. No murmur heard. No friction rub. No gallop.   Pulmonary:     Effort: No respiratory distress.     Breath sounds: Normal breath sounds. No wheezing, rhonchi or rales.  Abdominal:  General: Bowel sounds are normal.     Palpations: Abdomen is soft. There is no mass.     Tenderness: There is no abdominal tenderness. There is no guarding or rebound.  Musculoskeletal:        General: No tenderness. Normal range of motion.     Cervical back: Normal range of motion and neck supple.  Lymphadenopathy:     Cervical: No cervical adenopathy.  Skin:    General: Skin is warm.     Findings: No rash.  Neurological:     Mental Status: He is alert and oriented to person, place, and time.     Cranial Nerves: No cranial nerve deficit.     Deep Tendon Reflexes: Reflexes are normal and symmetric.     Wt Readings from Last 3 Encounters:  06/16/20 186 lb (84.4 kg)  05/14/20 182 lb (82.6 kg)  05/02/20 180 lb (81.6 kg)    BP 128/74   Pulse 70   Ht 6\' 1"  (1.854 m)   Wt 186 lb (84.4 kg)   BMI 24.54 kg/m   Assessment and Plan: Patient is here from follow-up from hospitalization in [place].  About 8 days ago patient had a syncopal episode resulting in a postconcussion circumstance that were seen today.  Patient was worked up for a neurologic event such as a TIA which ruled out stroke.  At this point in time we have the following events as follows  1. Syncope, unspecified syncope  type Patient sustained a syncopal event either due to a fall and a trauma event or cardiac/or neurologic or other event that caused a syncopal episode.  Patient was found unconscious and transferred to the hospital in [place] for evaluation and treatment with unknown circumstances that surround the cause.  2. Injury of head, subsequent encounter Patient does have a place on his forehead that would suggest that there was a trauma of some type which may have been is hitting a wooden step as he was returning from the surface.  There is evidence of a linear mark perhaps a wooden step that he hit when he fell down with resultant ecchymosis of the left orbit that may be resulting from a contusion that has developed around his eye from blood seeping down a tissue plane.  3. Post concussion syndrome Patient is gradually regaining cognitive status but still has some ambulatory unsteadiness.  Talking with the patient's wife he is about 90% return to his baseline.  We will continue to watch as that this is following the usually course of events from a concussion and we will recheck patient in 2 weeks.  4. Contusion of left thigh, subsequent encounter Patient has a significant contusion of his left thigh over the greater trochanteric area.  There is resultant dependent ecchymosis along the tissue plane of the left leg down to the ankle.  This is suggesting that this is a gradual resolution of the initially large contusion and is in the process of resolving on its own and we will keep watch.  5. Hospital discharge follow-up As noted above patient was discharged about 8 days ago following some type of syncopal event resulting in head injury and with retrograde amnesia of the most recent event prior.

## 2020-06-16 NOTE — Patient Instructions (Signed)
Returning to Sports and Activities After a Concussion, Adult Knowing when to return to sports and activities after a concussion is important. Make sure you wait to return to activity until after both of these have occurred:  Your symptoms are completely gone.  A health care provider says it is safe. Returning to activity too soon increases the risk of serious and long-lasting symptoms. You may also need to take time away from work or other activities that require concentration, depending on how severe your concussion is. Concussions can have serious effects on your brain. People who have more than one concussion are at greater risk of having long-term (chronic) headaches and problems with learning. When can I return to sports or other activities? You should stop participating in an activity right away after you hit your head or a concussion is suspected. You need to rest physically and mentally. You should also be monitored carefully by another adult. How quickly you can return to sports and other activities depends on:  Your age.  The severity of your concussion.  Your health before the injury.  Whether you have had a previous concussion. How should I gradually return to sports or other activities? You should not return to sports or activities until you are symptom-free without medicine for at least 24 hours. Your health care provider will determine when your symptoms are completely gone and when it is safe for you to practice and play sports again. Make sure you return to sports gradually. Do not try to do too much too soon. Gradually advance through the following activity levels to return to sports: 1. Begin with only light aerobic activity to increase your heart rate. You may bike, walk, or jog for up to 10 minutes. Do not jump, run, or lift weights. 2. Get moderate physical activity with some head and body movements. Running short distances, fast jogging, using a stationary bike, and  moderate-intensity weight lifting are okay. 3. Participate in high-intensity exercise without physical contact. 4. Return to your normal practice routine, which may include full contact. 5. Return to play in games, matches, or other competitions. Some people can progress quickly through these levels. Other people will need several days to go from one level to the next. Do not move on to the next level until you have been symptom-free for at least 24 hours after doing activity at the previous level. Symptoms to watch for include:  Tiredness (fatigue).  Headache.  Problems with balance, coordination, or memory. If you notice any of these warning signs during exercise or other physical activities, rest for at least 24 hours or until the symptoms go away. You can then return to activity. Start at the activity level that you were on before your symptoms began.   What symptoms are important to report to my health care provider? Concussion symptoms may not appear right away. They could also get worse at any time. It is important to let your health care provider know if you have any new or worsening symptoms. Watch for these symptoms: Physical symptoms  Headaches.  Dizziness and problems with coordination or balance.  Sensitivity to light or noise.  Nausea or vomiting.  Tiredness (fatigue).  Vision or hearing problems.  Seizure. Mental and emotional symptoms  Irritability or mood changes.  Memory problems.  Trouble concentrating, organizing, or making decisions.  Changes in eating or sleeping patterns.  Slowness in thinking, acting or reacting, speaking, or reading.  Anxiety or depression. Certain health issues may make your  recovery from a concussion take longer. Let your health care provider know if you have a history of:  Migraines.  Depression.  Mood disorders.  Anxiety.  A developmental disorder, such as ADHD.  Any previous concussions. What are some questions to  ask my health care provider? When you have a concussion, learning as much as you can about your injury can help you protect your long-term health. Ask your health care provider the following questions: Questions about recovery and treatment  Is it safe for me to return to sports or other physical activities?  Should I limit how much time I watch TV, play video games, or use a computer?  Do I need to take time away from work?  Do I need more sleep than normal? Questions about the effects of a concussion  What are the short-term and long-term consequences of my injury?  How might the concussion affect my professional life?  Could I have problems with memory or learning? Questions about getting another concussion  When should I go to the emergency room?  What happens if I get another concussion?  What are the warning signs of another concussion?  Could I have a concussion without knowing it?  How can I prevent another concussion from happening?  Should I consider not playing sports anymore? Where to find more information  Centers for Disease Control and Prevention: http://www.wolf.info/ Summary  Returning to activity too soon after a concussion increases the risk of serious and long-lasting symptoms.  After a concussion, you must rest physically and mentally until your symptoms are gone. Your health care provider will determine when it is safe for you to return to sports, work, and other activities that require concentration.  Returning to activity is a slow process, starting with limited activity and monitoring for symptoms.  If symptoms do not occur with activity, the intensity of activity can be increased.  Make sure you talk with your health care provider before returning to activity. This information is not intended to replace advice given to you by your health care provider. Make sure you discuss any questions you have with your health care provider. Document Revised:  10/25/2018 Document Reviewed: 10/25/2018 Elsevier Patient Education  No Name. Post-Concussion Syndrome  A concussion is a brain injury from a direct hit to the head or body. This hit causes the brain to shake quickly back and forth inside the skull. This can damage brain cells and cause chemical changes in the brain. Concussions are usually not life-threatening but can cause serious symptoms. Post-concussion syndrome is when symptoms that occur after a concussion last longer than normal. These symptoms can last from weeks to months. What are the causes? The cause of this condition is not known. It can happen whether your head injury was mild or severe. What increases the risk? You are more likely to develop this condition if:  You are male.  You are a child, teen, or young adult.  You have had a past head injury.  You have a history of headaches.  You have depression or anxiety.  You have loss of consciousness or cannot remember the event (have amnesia of the event).  You have multiple symptoms or severe symptoms at the time of your concussion. What are the signs or symptoms? Symptoms of this condition include:  Physical symptoms. You may have: ? Headaches. ? Tiredness. ? Dizziness and weakness. ? Blurry vision and sensitivity to light. ? Hearing difficulties. ? Problems with balance.  Mental and  emotional symptoms. You may have: ? Memory problems and trouble concentrating. ? Difficulty sleeping or staying asleep. ? Feelings of irritability. ? Anxiety or depression. ? Difficulty learning new things. How is this diagnosed? This condition may be diagnosed based on:  Your symptoms.  A description of your injury.  Your medical history.  Testing your strength, balance, and nerve function (neurological examination). Your health care provider may order other tests, including brain imaging such as a CT scan or an MRI, and memory testing (neuropsychological  testing). How is this treated? Treatment for this condition may depend on your symptoms. Symptoms usually go away on their own over time. Treatments may include:  Medicines for headaches, anxiety, depression, and trouble sleeping (insomnia).  Resting your brain and body for a few days after your injury.  Rehabilitation therapy, such as: ? Physical or occupational therapy. This may include exercises to help with balance and dizziness. ? Mental health counseling. A form of talk therapy called cognitive behavioral therapy (CBT) can be especially helpful. This therapy helps you set goals and follow up on the changes that you make. ? Speech therapy. ? Vision therapy. A brain and eye specialist can recommend treatments for vision problems. Follow these instructions at home: Medicines  Take over-the-counter and prescription medicines only as told by your health care provider.  Avoid opioid prescription pain medicines when recovering from a concussion. Activity  Limit your mental activities for the first few days after your injury. This may include not doing the following: ? Homework or job-related work. ? Complex thinking. ? Watching TV, and using a computer or phone. ? Playing memory games and puzzles.  Gradually return to your normal activity level. If a certain activity brings on your symptoms, stop or slow down until you can do the activity without it triggering your symptoms.  Limit physical activity, such as exercise or sports, for the first few days after a concussion. Gradually return to normal activity as told by your health care provider.  Rest. Rest helps your brain heal. Make sure you: ? Get plenty of sleep at night. Most adults should get at least 7-9 hours of sleep each night. ? Rest during the day. Take naps or rest breaks when you feel tired.  Do not do high-risk activities that could cause a second concussion, such as riding a bike or playing sports. Having another  concussion before the first one has healed can be dangerous. General instructions  Do not drink alcohol until your health care provider says that you can.  Keep track of the frequency and the severity of your symptoms. Give this information to your health care provider.  Keep all follow-up visits as directed by your health care provider. This is important. This includes visits with specialists.   Contact a health care provider if:  Your symptoms do not improve.  You have another injury. Get help right away if you:  Have a severe or worsening headache.  Are confused.  Have trouble staying awake.  Faint.  Vomit.  Have weakness or numbness in any part of your body.  Have a seizure.  Have trouble speaking. Summary  Post-concussion syndrome is when symptoms that occur after a concussion last longer than normal.  Symptoms usually go away on their own over time. Depending on your symptoms, you may need treatment, such as medicines or rehabilitation therapy.  Rest your brain and body for a few days after your injury. Gradually return to normal activities as told by your health  care provider.  Get plenty of sleep, and avoid alcohol and opioid pain medicines while recovering from a concussion. This information is not intended to replace advice given to you by your health care provider. Make sure you discuss any questions you have with your health care provider. Document Revised: 03/07/2019 Document Reviewed: 03/07/2019 Elsevier Patient Education  2021 [employer]. Hematoma A hematoma is a collection of blood under the skin, in an organ, in a body space, in a joint space, or in other tissue. The blood can thicken (clot) to form a lump that you can see and feel. The lump is often firm and may become sore and tender. Most hematomas get better in a few days to weeks. However, some hematomas may be serious and require medical care. Hematomas can range from very small to very  large. What are the causes? This condition is caused by:  A blunt or penetrating injury.  A leakage from a blood vessel under the skin.  Some medical procedures, including surgeries, such as oral surgery, face lifts, and surgeries on the joints.  Some medical conditions that cause bleeding or bruising. There may be multiple hematomas that appear in different areas of the body. What increases the risk? You are more likely to develop this condition if:  You are an older adult.  You use blood thinners. What are the signs or symptoms? Symptoms of this condition depend on where the hematoma is located.  Common symptoms of a hematoma that is under the skin include:  A firm lump on the body.  Pain and tenderness in the area.  Bruising. Blue, dark blue, purple-red, or yellowish skin (discoloration) may appear at the site of the hematoma if the hematoma is close to the surface of the skin. Common symptoms of a hematoma that is deep in the tissues or body spaces may be less obvious. They include:  A collection of blood in the stomach (intra-abdominal hematoma). This may cause pain in the abdomen, weakness, fainting, and shortness of breath.  A collection of blood in the head (intracranial hematoma). This may cause a headache or symptoms such as weakness, trouble speaking or understanding, or a change in consciousness.   How is this diagnosed? This condition is diagnosed based on:  Your medical history.  A physical exam.  Imaging tests, such as an ultrasound or CT scan. These may be needed if your health care provider suspects a hematoma in deeper tissues or body spaces.  Blood tests. These may be needed if your health care provider believes that the hematoma is caused by a medical condition. How is this treated? Treatment for this condition depends on the cause, size, and location of the hematoma. Treatment may include:  Doing nothing. The majority of hematomas do not need  treatment as many of them go away on their own over time.  Surgery or close monitoring. This may be needed for large hematomas or hematomas that affect vital organs.  Medicines. Medicines may be given if there is an underlying medical cause for the hematoma. Follow these instructions at home: Managing pain, stiffness, and swelling  If directed, put ice on the affected area. ? Put ice in a plastic bag. ? Place a towel between your skin and the bag. ? Leave the ice on for 20 minutes, 2-3 times a day for the first couple of days.  If directed, apply heat to the affected area after applying ice for a couple of days. Use the heat source that your  health care provider recommends, such as a moist heat pack or a heating pad. ? Place a towel between your skin and the heat source. ? Leave the heat on for 20-30 minutes. ? Remove the heat if your skin turns bright red. This is especially important if you are unable to feel pain, heat, or cold. You may have a greater risk of getting burned.  Raise (elevate) the affected area above the level of your heart while you are sitting or lying down.  If told, wrap the affected area with an elastic bandage. The bandage applies pressure (compression) to the area, which may help to reduce swelling and promote healing. Do not wrap the bandage too tightly around the affected area.  If your hematoma is on a leg or foot (lower extremity) and is painful, your health care provider may recommend crutches. Use them as told by your health care provider.   General instructions  Take over-the-counter and prescription medicines only as told by your health care provider.  Keep all follow-up visits as told by your health care provider. This is important. Contact a health care provider if:  You have a fever.  The swelling or discoloration gets worse.  You develop more hematomas. Get help right away if:  Your pain is worse or your pain is not controlled with  medicine.  Your skin over the hematoma breaks or starts bleeding.  Your hematoma is in your chest or abdomen and you have weakness, shortness of breath, or a change in consciousness.  You have a hematoma on your scalp that is caused by a fall or injury, and you also have: ? A headache that gets worse. ? Trouble speaking or understanding speech. ? Weakness. ? Change in alertness or consciousness. Summary  A hematoma is a collection of blood under the skin, in an organ, in a body space, in a joint space, or in other tissue.  This condition usually does not need treatment because many hematomas go away on their own over time.  Large hematomas, or those that may affect vital organs, may need surgical drainage or monitoring. If the hematoma is caused by a medical condition, medicines may be prescribed.  Get help right away if your hematoma breaks or starts to bleed, you have shortness of breath, or you have a headache or trouble speaking after a fall. This information is not intended to replace advice given to you by your health care provider. Make sure you discuss any questions you have with your health care provider. Document Revised: 08/09/2018 Document Reviewed: 08/18/2017 Elsevier Patient Education  2021 [employer].

## 2020-06-19 ENCOUNTER — Encounter: Payer: Self-pay | Admitting: Cardiology

## 2020-06-19 ENCOUNTER — Ambulatory Visit (INDEPENDENT_AMBULATORY_CARE_PROVIDER_SITE_OTHER): Payer: PPO

## 2020-06-19 ENCOUNTER — Ambulatory Visit: Payer: PPO | Admitting: Cardiology

## 2020-06-19 ENCOUNTER — Other Ambulatory Visit: Payer: Self-pay

## 2020-06-19 VITALS — BP 143/88 | HR 89 | Ht 73.0 in | Wt 186.0 lb

## 2020-06-19 DIAGNOSIS — I7101 Dissection of thoracic aorta: Secondary | ICD-10-CM | POA: Diagnosis not present

## 2020-06-19 DIAGNOSIS — R55 Syncope and collapse: Secondary | ICD-10-CM

## 2020-06-19 DIAGNOSIS — E7800 Pure hypercholesterolemia, unspecified: Secondary | ICD-10-CM | POA: Diagnosis not present

## 2020-06-19 DIAGNOSIS — I10 Essential (primary) hypertension: Secondary | ICD-10-CM

## 2020-06-19 DIAGNOSIS — I71011 Dissection of aortic arch: Secondary | ICD-10-CM

## 2020-06-19 NOTE — Progress Notes (Signed)
Cardiology Office Note:    Date:  06/19/2020   ID:  Andrew [name], DOB Nov 27, 1950, MRN [id number]  PCP:  Juline Patch, MD   [place]  Cardiologist:  No primary care provider on file.  Advanced Practice Provider:  No care team member to display Electrophysiologist:  None       Referring MD: Juline Patch, MD   Chief Complaint  Patient presents with  . NEW patient-syncope   Andrew [name] is a 70 y.o. male who is being seen today for the evaluation of syncope at the request of Ronnald Ramp, Deanna C, MD.   History of Present Illness:    Andrew [name] is a 70 y.o. male with a hx of type A aortic dissection (s/p hemiarch repair with aortic valve replacement bioprosthetic at [facility]), hypertension, hyperlipidemia, diabetes who presents due to syncope.  Patient was in [place] with family when when he passed out 10 days ago.  He [place] at the beach, cholesterol and fishing.  He started walking towards the house but does not remember anything after.  Per his wife, patient was found face down, large bruise on his left thigh, laceration on his frontal scalp was noted.  The beach attendant called EMS patient taken to local hospital where head imaging showed concussions, no other abnormalities.  His heart rate was noted to be low in the 40s when he had an episode of syncope.  Was on metoprolol which was stopped after this incident.  His heart rate has since improved.  He denies having any episodes since or prior episodes.  Has a history of aortic dissection status post repair with replacement of the aortic valve in 2020.  Has since been following up at [facility] with serial CT scans showing stable aortic root and ascending aorta size.  He denies chest pain, palpitations, dizziness.  Past Medical History:  Diagnosis Date  . Depression   . Diabetes mellitus without complication ([place])   . Hyperlipidemia   . Hypertension     Past Surgical History:  Procedure  Laterality Date  . COLONOSCOPY WITH PROPOFOL N/A 07/13/2019   Procedure: COLONOSCOPY WITH PROPOFOL;  Surgeon: [name], MD;  Location: [facility] ENDOSCOPY;  Service: Endoscopy;  Laterality: N/A;  . NASAL SINUS SURGERY    . REPAIR OF ACUTE ASCENDING THORACIC AORTIC DISSECTION  12/2018    Current Medications: Current Meds  Medication Sig  . amLODipine (NORVASC) 10 MG tablet Take 1 tablet (10 mg total) by mouth daily.  Marland Kitchen atorvastatin (LIPITOR) 40 MG tablet Take 1 tablet (40 mg total) by mouth daily.  . clopidogrel (PLAVIX) 75 MG tablet Take 75 mg by mouth every morning.  Marland Kitchen glipiZIDE (GLUCOTROL XL) 2.5 MG 24 hr tablet Take 1 tablet (2.5 mg total) by mouth daily with breakfast.  . glucose blood test strip Use as instructed  . lisinopril-hydrochlorothiazide (ZESTORETIC) 20-12.5 MG tablet Take 1 tablet by mouth daily.  . metFORMIN (GLUCOPHAGE) 1000 MG tablet Take 1 tablet (1,000 mg total) by mouth 2 (two) times daily.  Marland Kitchen venlafaxine XR (EFFEXOR-XR) 150 MG 24 hr capsule Take 1 capsule (150 mg total) by mouth daily with breakfast.     Allergies:   Patient has no known allergies.   Social History   Socioeconomic History  . Marital status: Married    Spouse name: Not on file  . Number of children: 1  . Years of education: Not on file  . Highest education level: Associate degree: academic  program  Occupational History  . Occupation: Retired  Tobacco Use  . Smoking status: Never Smoker  . Smokeless tobacco: Never Used  . Tobacco comment: smoking cessation materials not required  Vaping Use  . Vaping Use: Never used  Substance and Sexual Activity  . Alcohol use: Not Currently    Alcohol/week: 5.0 standard drinks    Types: 2 Glasses of wine, 3 Cans of beer per week    Comment: weekly  . Drug use: No  . Sexual activity: Not Currently  Other Topics Concern  . Not on file  Social History Narrative  . Not on file   Social Determinants of Health   Financial Resource Strain: Low Risk    . Difficulty of Paying Living Expenses: Not hard at all  Food Insecurity: No Food Insecurity  . Worried About Charity fundraiser in the Last Year: Never true  . Ran Out of Food in the Last Year: Never true  Transportation Needs: No Transportation Needs  . Lack of Transportation (Medical): No  . Lack of Transportation (Non-Medical): No  Physical Activity: Sufficiently Active  . Days of Exercise per Week: 5 days  . Minutes of Exercise per Session: 40 min  Stress: No Stress Concern Present  . Feeling of Stress : Not at all  Social Connections: Moderately Isolated  . Frequency of Communication with Friends and Family: More than three times a week  . Frequency of Social Gatherings with Friends and Family: Twice a week  . Attends Religious Services: Never  . Active Member of Clubs or Organizations: No  . Attends Archivist Meetings: Never  . Marital Status: Married     Family History: The patient's family history includes Healthy in his father; Heart disease in his mother.  ROS:   Please see the history of present illness.     All other systems reviewed and are negative.  EKGs/Labs/Other Studies Reviewed:    The following studies were reviewed today:   EKG:  EKG is  ordered today.  The ekg ordered today demonstrates normal sinus rhythm  Recent Labs: 05/02/2020: BUN 20; Creatinine, Ser 1.00; Hemoglobin 16.1; Platelets 300; Potassium 3.5; Sodium 143  Recent Lipid Panel    Component Value Date/Time   CHOL 131 05/14/2020 1111   TRIG 119 05/14/2020 1111   HDL 45 05/14/2020 1111   CHOLHDL 3.3 07/31/2018 1131   LDLCALC 65 05/14/2020 1111     Risk Assessment/Calculations:      Physical Exam:    VS:  BP (!) 143/88 (BP Location: Right Arm, Patient Position: Sitting, Cuff Size: Normal)   Pulse 89   Ht 6\' 1"  (1.854 m)   Wt 186 lb (84.4 kg)   SpO2 96%   BMI 24.54 kg/m     Wt Readings from Last 3 Encounters:  06/19/20 186 lb (84.4 kg)  06/16/20 186 lb (84.4 kg)   05/14/20 182 lb (82.6 kg)     GEN:  Well nourished, well developed in no acute distress HEENT: Normal NECK: No JVD; No carotid bruits LYMPHATICS: No lymphadenopathy CARDIAC: RRR, no murmurs, rubs, gallops RESPIRATORY:  Clear to auscultation without rales, wheezing or rhonchi  ABDOMEN: Soft, non-tender, non-distended MUSCULOSKELETAL:  No edema; No deformity  SKIN: Warm and dry NEUROLOGIC:  Alert and oriented x 3 PSYCHIATRIC:  Normal affect   ASSESSMENT:    1. Syncope and collapse   2. 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 Primary hypertension   4. Pure hypercholesterolemia  PLAN:    In order of problems listed above:  1. Patient with syncopal episode, etiology unclear.  Could be cardiac.  Place a 2-week cardiac monitor to evaluate any significant underlying arrhythmias, get echocardiogram to evaluate any structural abnormalities. 2. History of aortic dissection status post hemiarch repair with aortic valve replacement.  Last CT in 2021 showing stable aortic root, aorta size.  Follows with [facility] surgery for frequent CT scans. 3. Hypertension, BP usually controlled, elevated today.  Continue BP meds as prescribed.  Has not taken his blood pressure medications today. 4. Hyperlipidemia, continue statin.  Follow-up after cardiac monitor and echocardiogram.      Medication Adjustments/Labs and Tests Ordered: Current medicines are reviewed at length with the patient today.  Concerns regarding medicines are outlined above.  Orders Placed This Encounter  Procedures  . LONG TERM MONITOR (3-14 DAYS)  . EKG 12-Lead  . ECHOCARDIOGRAM COMPLETE   No orders of the defined types were placed in this encounter.   Patient Instructions  Medication Instructions:  Your physician recommends that you continue on your current medications as directed. Please refer to the Current Medication list given to you today.  *If you need a refill on your cardiac medications before your  next appointment, please call your pharmacy*   Lab Work: None ordered If you have labs (blood work) drawn today and your tests are completely normal, you will receive your results only by: Marland Kitchen MyChart Message (if you have MyChart) OR . A paper copy in the mail If you have any lab test that is abnormal or we need to change your treatment, we will call you to review the results.   Testing/Procedures:  1.  Your physician has requested that you have an echocardiogram. Echocardiography is a painless test that uses sound waves to create images of your heart. It provides your doctor with information about the size and shape of your heart and how well your heart's chambers and valves are working. This procedure takes approximately one hour. There are no restrictions for this procedure.  2.  Your physician has recommended that you wear a Zio monitor for 2 weeks. This monitor is a medical device that records the heart's electrical activity. Doctors most often use these monitors to diagnose arrhythmias. Arrhythmias are problems with the speed or rhythm of the heartbeat. The monitor is a small device applied to your chest. You can wear one while you do your normal daily activities. While wearing this monitor if you have any symptoms to push the button and record what you felt. Once you have worn this monitor for the period of time provider prescribed (Usually 14 days), you will return the monitor device in the postage paid box. Once it is returned they will download the data collected and provide [place] with a report which the provider will then review and we will call you with those results. Important tips:  1. Avoid showering during the first 24 hours of wearing the monitor. 2. Avoid excessive sweating to help maximize wear time. 3. Do not submerge the device, no hot tubs, and no swimming pools. 4. Keep any lotions or oils away from the patch. 5. After 24 hours you may shower with the patch on. Take brief  showers with your back facing the shower head.  6. Do not remove patch once it has been placed because that will interrupt data and decrease adhesive wear time. 7. Push the button when you have any symptoms and write down what you  were feeling. 8. Once you have completed wearing your monitor, remove and place into box which has postage paid and place in your outgoing mailbox.  9. If for some reason you have misplaced your box then call our office and we can provide another box and/or mail it off for you.          Follow-Up: At [facility], you and your health needs are our priority.  As part of our continuing mission to provide you with exceptional heart care, we have created designated Provider Care Teams.  These Care Teams include your primary Cardiologist (physician) and Advanced Practice Providers (APPs -  Physician Assistants and Nurse Practitioners) who all work together to provide you with the care you need, when you need it.  We recommend signing up for the patient portal called "MyChart".  Sign up information is provided on this After Visit Summary.  MyChart is used to connect with patients for Virtual Visits (Telemedicine).  Patients are able to view lab/test results, encounter notes, upcoming appointments, etc.  Non-urgent messages can be sent to your provider as well.   To learn more about what you can do with MyChart, go to [email or web address].    Your next appointment:   6 week(s)  The format for your next appointment:   In Person  Provider:   Kate Sable, MD   Other Instructions      Signed, Kate Sable, MD  06/19/2020 12:22 PM    [place]

## 2020-06-19 NOTE — Patient Instructions (Signed)
Medication Instructions:  Your physician recommends that you continue on your current medications as directed. Please refer to the Current Medication list given to you today.  *If you need a refill on your cardiac medications before your next appointment, please call your pharmacy*   Lab Work: None ordered If you have labs (blood work) drawn today and your tests are completely normal, you will receive your results only by: Marland Kitchen MyChart Message (if you have MyChart) OR . A paper copy in the mail If you have any lab test that is abnormal or we need to change your treatment, we will call you to review the results.   Testing/Procedures:  1.  Your physician has requested that you have an echocardiogram. Echocardiography is a painless test that uses sound waves to create images of your heart. It provides your doctor with information about the size and shape of your heart and how well your heart's chambers and valves are working. This procedure takes approximately one hour. There are no restrictions for this procedure.  2.  Your physician has recommended that you wear a Zio monitor for 2 weeks. This monitor is a medical device that records the heart's electrical activity. Doctors most often use these monitors to diagnose arrhythmias. Arrhythmias are problems with the speed or rhythm of the heartbeat. The monitor is a small device applied to your chest. You can wear one while you do your normal daily activities. While wearing this monitor if you have any symptoms to push the button and record what you felt. Once you have worn this monitor for the period of time provider prescribed (Usually 14 days), you will return the monitor device in the postage paid box. Once it is returned they will download the data collected and provide [place] with a report which the provider will then review and we will call you with those results. Important tips:  1. Avoid showering during the first 24 hours of wearing the  monitor. 2. Avoid excessive sweating to help maximize wear time. 3. Do not submerge the device, no hot tubs, and no swimming pools. 4. Keep any lotions or oils away from the patch. 5. After 24 hours you may shower with the patch on. Take brief showers with your back facing the shower head.  6. Do not remove patch once it has been placed because that will interrupt data and decrease adhesive wear time. 7. Push the button when you have any symptoms and write down what you were feeling. 8. Once you have completed wearing your monitor, remove and place into box which has postage paid and place in your outgoing mailbox.  9. If for some reason you have misplaced your box then call our office and we can provide another box and/or mail it off for you.          Follow-Up: At [facility], you and your health needs are our priority.  As part of our continuing mission to provide you with exceptional heart care, we have created designated Provider Care Teams.  These Care Teams include your primary Cardiologist (physician) and Advanced Practice Providers (APPs -  Physician Assistants and Nurse Practitioners) who all work together to provide you with the care you need, when you need it.  We recommend signing up for the patient portal called "MyChart".  Sign up information is provided on this After Visit Summary.  MyChart is used to connect with patients for Virtual Visits (Telemedicine).  Patients are able to view lab/test results, encounter notes,  upcoming appointments, etc.  Non-urgent messages can be sent to your provider as well.   To learn more about what you can do with MyChart, go to [email or web address].    Your next appointment:   6 week(s)  The format for your next appointment:   In Person  Provider:   Kate Sable, MD   Other Instructions

## 2020-06-24 ENCOUNTER — Telehealth: Payer: Self-pay

## 2020-06-24 NOTE — Telephone Encounter (Signed)
Copied from [place] [id number]. Topic: Referral - Request for Referral >> Jun 24, 2020 10:24 AM Lennox Solders wrote: Has patient seen PCP for this complaint? Yes Reason for referral: concussion/brain damage. Pt wife does not want to wait until after appt with dr Ronnald Ramp on4/09/2020 . Pt wife is requesting a referral to neurologist. Pt has healthteam adv

## 2020-06-24 NOTE — Telephone Encounter (Signed)
Will come in this Thursday

## 2020-06-26 ENCOUNTER — Ambulatory Visit (INDEPENDENT_AMBULATORY_CARE_PROVIDER_SITE_OTHER): Payer: PPO | Admitting: Family Medicine

## 2020-06-26 ENCOUNTER — Other Ambulatory Visit: Payer: Self-pay

## 2020-06-26 ENCOUNTER — Encounter: Payer: Self-pay | Admitting: Family Medicine

## 2020-06-26 VITALS — BP 136/86 | HR 72 | Ht 73.0 in | Wt 186.0 lb

## 2020-06-26 DIAGNOSIS — S7012XD Contusion of left thigh, subsequent encounter: Secondary | ICD-10-CM | POA: Diagnosis not present

## 2020-06-26 DIAGNOSIS — F0781 Postconcussional syndrome: Secondary | ICD-10-CM

## 2020-06-26 NOTE — Progress Notes (Signed)
Date:  06/26/2020   Name:  Andrew [name]   DOB:  1950/10/28   MRN:  [id number]   Chief Complaint: Follow-up (Concussion- pt is wanting to be released to drive. Not having any headaches, no photosensitivity or noise bothering him)  Neurologic Problem The patient's primary symptoms include memory loss. The patient's pertinent negatives include no altered mental status, clumsiness, focal sensory loss, focal weakness, loss of balance, near-syncope, slurred speech, syncope, visual change or weakness. This is a chronic problem. The current episode started more than 1 year ago. The neurological problem developed suddenly. The problem has been gradually improving since onset. There was no focality noted. Pertinent negatives include no abdominal pain, back pain, chest pain, dizziness, fever, headaches, nausea, neck pain, palpitations or shortness of breath. Treatments tried: cognitive/physical activity rest. The treatment provided moderate relief. There is no history of a bleeding disorder, a clotting disorder, a CVA, dementia, head trauma, liver disease, mood changes or seizures.    Lab Results  Component Value Date   CREATININE 1.00 05/02/2020   BUN 20 05/02/2020   NA 143 05/02/2020   K 3.5 05/02/2020   CL 99 05/02/2020   CO2 30 05/02/2020   Lab Results  Component Value Date   CHOL 131 05/14/2020   HDL 45 05/14/2020   LDLCALC 65 05/14/2020   TRIG 119 05/14/2020   CHOLHDL 3.3 07/31/2018   No results found for: TSH Lab Results  Component Value Date   HGBA1C 5.9 (H) 05/14/2020   Lab Results  Component Value Date   WBC 6.2 05/02/2020   HGB 16.1 05/02/2020   HCT 46.5 05/02/2020   MCV 95.1 05/02/2020   PLT 300 05/02/2020   No results found for: ALT, AST, GGT, ALKPHOS, BILITOT   Review of Systems  Constitutional: Negative for chills and fever.  HENT: Negative for drooling, ear discharge, ear pain and sore throat.   Respiratory: Negative for cough, shortness of breath and  wheezing.   Cardiovascular: Negative for chest pain, palpitations, leg swelling and near-syncope.  Gastrointestinal: Negative for abdominal pain, blood in stool, constipation, diarrhea and nausea.  Endocrine: Negative for polydipsia.  Genitourinary: Negative for dysuria, frequency, hematuria and urgency.  Musculoskeletal: Negative for back pain, myalgias and neck pain.  Skin: Negative for rash.  Allergic/Immunologic: Negative for environmental allergies.  Neurological: Negative for dizziness, focal weakness, syncope, weakness, headaches and loss of balance.  Hematological: Does not bruise/bleed easily.  Psychiatric/Behavioral: Positive for memory loss. Negative for suicidal ideas. The patient is not nervous/anxious.     Patient Active Problem List   Diagnosis Date Noted  . Aortic atherosclerosis ([place]) 05/02/2020  . Screening for colon cancer   . Aortic dissection proximal to innominate ([place]) 12/23/2018  . Mixed hyperlipidemia 10/05/2016  . Familial multiple lipoprotein-type hyperlipidemia 08/02/2014  . Routine general medical examination at a health care facility 08/02/2014  . Recurrent major depressive episodes ([place]) 08/02/2014  . Essential (primary) hypertension 08/02/2014  . Overweight 08/02/2014  . Diabetes mellitus, type 2 ([place]) 08/02/2014    No Known Allergies  Past Surgical History:  Procedure Laterality Date  . COLONOSCOPY WITH PROPOFOL N/A 07/13/2019   Procedure: COLONOSCOPY WITH PROPOFOL;  Surgeon: [name], MD;  Location: [facility] ENDOSCOPY;  Service: Endoscopy;  Laterality: N/A;  . NASAL SINUS SURGERY    . REPAIR OF ACUTE ASCENDING THORACIC AORTIC DISSECTION  12/2018    Social History   Tobacco Use  . Smoking status: Never Smoker  . Smokeless tobacco: Never Used  .  Tobacco comment: smoking cessation materials not required  Vaping Use  . Vaping Use: Never used  Substance Use Topics  . Alcohol use: Not Currently    Alcohol/week: 5.0 standard drinks    Types: 2  Glasses of wine, 3 Cans of beer per week    Comment: weekly  . Drug use: No     Medication list has been reviewed and updated.  Current Meds  Medication Sig  . amLODipine (NORVASC) 10 MG tablet Take 1 tablet (10 mg total) by mouth daily.  Marland Kitchen atorvastatin (LIPITOR) 40 MG tablet Take 1 tablet (40 mg total) by mouth daily.  . clopidogrel (PLAVIX) 75 MG tablet Take 75 mg by mouth every morning.  Marland Kitchen glipiZIDE (GLUCOTROL XL) 2.5 MG 24 hr tablet Take 1 tablet (2.5 mg total) by mouth daily with breakfast.  . glucose blood test strip Use as instructed  . lisinopril-hydrochlorothiazide (ZESTORETIC) 20-12.5 MG tablet Take 1 tablet by mouth daily.  . metFORMIN (GLUCOPHAGE) 1000 MG tablet Take 1 tablet (1,000 mg total) by mouth 2 (two) times daily.  Marland Kitchen venlafaxine XR (EFFEXOR-XR) 150 MG 24 hr capsule Take 1 capsule (150 mg total) by mouth daily with breakfast.    PHQ 2/9 Scores 06/26/2020 06/16/2020 05/14/2020 05/07/2020  PHQ - 2 Score 0 0 0 0  PHQ- 9 Score 0 0 0 -    GAD 7 : Generalized Anxiety Score 06/26/2020 06/16/2020 05/14/2020 10/29/2019  Nervous, Anxious, on Edge 0 0 0 0  Control/stop worrying 0 0 0 0  Worry too much - different things 0 0 0 0  Trouble relaxing 0 1 0 0  Restless 0 0 0 0  Easily annoyed or irritable 0 1 0 0  Afraid - awful might happen 0 0 0 0  Total GAD 7 Score 0 2 0 0  Anxiety Difficulty - Somewhat difficult - Not difficult at all    BP Readings from Last 3 Encounters:  06/26/20 136/86  06/19/20 (!) 143/88  06/16/20 128/74    Physical Exam Vitals and nursing note reviewed.  HENT:     Head: Normocephalic.     Right Ear: Tympanic membrane, ear canal and external ear normal. There is no impacted cerumen.     Left Ear: Tympanic membrane, ear canal and external ear normal. There is no impacted cerumen.     Nose: Nose normal. No congestion or rhinorrhea.     Mouth/Throat:     Mouth: Mucous membranes are moist.  Eyes:     General: No scleral icterus.       Right eye: No  discharge.        Left eye: No discharge.     Conjunctiva/sclera: Conjunctivae normal.     Pupils: Pupils are equal, round, and reactive to light.  Neck:     Thyroid: No thyromegaly.     Vascular: No JVD.     Trachea: No tracheal deviation.  Cardiovascular:     Rate and Rhythm: Normal rate and regular rhythm.     Heart sounds: Normal heart sounds. No murmur heard. No friction rub. No gallop.   Pulmonary:     Effort: No respiratory distress.     Breath sounds: No wheezing, rhonchi or rales.  Abdominal:     General: Bowel sounds are normal.     Palpations: Abdomen is soft. There is no mass.     Tenderness: There is no abdominal tenderness. There is no guarding or rebound.  Musculoskeletal:  General: No tenderness. Normal range of motion.     Cervical back: Normal range of motion and neck supple.  Lymphadenopathy:     Cervical: No cervical adenopathy.  Skin:    General: Skin is warm.     Findings: No rash.  Neurological:     Mental Status: He is alert and oriented to person, place, and time.     Cranial Nerves: No cranial nerve deficit.     Deep Tendon Reflexes: Reflexes are normal and symmetric.     Wt Readings from Last 3 Encounters:  06/26/20 186 lb (84.4 kg)  06/19/20 186 lb (84.4 kg)  06/16/20 186 lb (84.4 kg)    BP 136/86   Pulse 72   Ht 6\' 1"  (1.854 m)   Wt 186 lb (84.4 kg)   BMI 24.54 kg/m   Assessment and Plan:  1. Post concussion syndrome New onset.  Subsequent visit doing better patient is pushing it and we have discussed that this is a gradual recovery and will still need to have some relative cognitive and physical activity rest.  Patient will continue with current activities but at a reduced stage and may drive but not on interstate circumstances and do simple tasks around the house.  Will recheck in 2 weeks at which time we will determine whether we can return to full unrestricted activities.  2. Contusion of left thigh, subsequent  encounter Palpable thigh contusion which is gradually decreasing in size but is still firm and is unlikely to be dissipating quickly.  This is a gradual process and is going to take time for it to completely resolve.

## 2020-07-01 DIAGNOSIS — R55 Syncope and collapse: Secondary | ICD-10-CM | POA: Diagnosis not present

## 2020-07-03 ENCOUNTER — Ambulatory Visit: Payer: PPO | Admitting: Family Medicine

## 2020-07-07 DIAGNOSIS — R55 Syncope and collapse: Secondary | ICD-10-CM | POA: Diagnosis not present

## 2020-07-10 ENCOUNTER — Other Ambulatory Visit: Payer: Self-pay

## 2020-07-10 ENCOUNTER — Encounter: Payer: Self-pay | Admitting: Family Medicine

## 2020-07-10 ENCOUNTER — Ambulatory Visit (INDEPENDENT_AMBULATORY_CARE_PROVIDER_SITE_OTHER): Payer: PPO | Admitting: Family Medicine

## 2020-07-10 VITALS — BP 120/86 | HR 58 | Ht 73.0 in | Wt 187.0 lb

## 2020-07-10 DIAGNOSIS — S7012XD Contusion of left thigh, subsequent encounter: Secondary | ICD-10-CM | POA: Diagnosis not present

## 2020-07-10 DIAGNOSIS — F0781 Postconcussional syndrome: Secondary | ICD-10-CM

## 2020-07-10 DIAGNOSIS — Q670 Congenital facial asymmetry: Secondary | ICD-10-CM | POA: Diagnosis not present

## 2020-07-10 NOTE — Progress Notes (Signed)
Date:  07/10/2020   Name:  Andrew [name]   DOB:  1951/02/11   MRN:  [id number]   Chief Complaint: Follow-up (Doing well)  Patient is a 70 year old male who presents for a postconcussion syndrome exam. The patient reports the following problems: asymptomatic. Health maintenance has been reviewed up to date.   Lab Results  Component Value Date   CREATININE 1.00 05/02/2020   BUN 20 05/02/2020   NA 143 05/02/2020   K 3.5 05/02/2020   CL 99 05/02/2020   CO2 30 05/02/2020   Lab Results  Component Value Date   CHOL 131 05/14/2020   HDL 45 05/14/2020   LDLCALC 65 05/14/2020   TRIG 119 05/14/2020   CHOLHDL 3.3 07/31/2018   No results found for: TSH Lab Results  Component Value Date   HGBA1C 5.9 (H) 05/14/2020   Lab Results  Component Value Date   WBC 6.2 05/02/2020   HGB 16.1 05/02/2020   HCT 46.5 05/02/2020   MCV 95.1 05/02/2020   PLT 300 05/02/2020   No results found for: ALT, AST, GGT, ALKPHOS, BILITOT   Review of Systems  Constitutional: Negative for chills and fever.  HENT: Negative for drooling, ear discharge, ear pain, facial swelling, hearing loss, mouth sores, nosebleeds, postnasal drip and sore throat.   Respiratory: Negative for cough, shortness of breath and wheezing.   Cardiovascular: Negative for chest pain, palpitations and leg swelling.  Gastrointestinal: Negative for abdominal pain, blood in stool, constipation, diarrhea and nausea.  Endocrine: Negative for polydipsia.  Genitourinary: Negative for dysuria, frequency, hematuria and urgency.  Musculoskeletal: Negative for back pain, myalgias and neck pain.  Skin: Negative for rash.  Allergic/Immunologic: Negative for environmental allergies.  Neurological: Negative for dizziness and headaches.  Hematological: Does not bruise/bleed easily.  Psychiatric/Behavioral: Negative for suicidal ideas. The patient is not nervous/anxious.     Patient Active Problem List   Diagnosis Date Noted  . Aortic  atherosclerosis ([place]) 05/02/2020  . Screening for colon cancer   . Aortic dissection proximal to innominate ([place]) 12/23/2018  . Mixed hyperlipidemia 10/05/2016  . Familial multiple lipoprotein-type hyperlipidemia 08/02/2014  . Routine general medical examination at a health care facility 08/02/2014  . Recurrent major depressive episodes ([place]) 08/02/2014  . Essential (primary) hypertension 08/02/2014  . Overweight 08/02/2014  . Diabetes mellitus, type 2 ([place]) 08/02/2014    No Known Allergies  Past Surgical History:  Procedure Laterality Date  . COLONOSCOPY WITH PROPOFOL N/A 07/13/2019   Procedure: COLONOSCOPY WITH PROPOFOL;  Surgeon: [name], MD;  Location: [facility] ENDOSCOPY;  Service: Endoscopy;  Laterality: N/A;  . NASAL SINUS SURGERY    . REPAIR OF ACUTE ASCENDING THORACIC AORTIC DISSECTION  12/2018    Social History   Tobacco Use  . Smoking status: Never Smoker  . Smokeless tobacco: Never Used  . Tobacco comment: smoking cessation materials not required  Vaping Use  . Vaping Use: Never used  Substance Use Topics  . Alcohol use: Not Currently    Alcohol/week: 5.0 standard drinks    Types: 2 Glasses of wine, 3 Cans of beer per week    Comment: weekly  . Drug use: No     Medication list has been reviewed and updated.  Current Meds  Medication Sig  . amLODipine (NORVASC) 10 MG tablet Take 1 tablet (10 mg total) by mouth daily.  . clopidogrel (PLAVIX) 75 MG tablet Take 75 mg by mouth every morning.  Marland Kitchen glipiZIDE (GLUCOTROL XL) 2.5 MG  24 hr tablet Take 1 tablet (2.5 mg total) by mouth daily with breakfast.  . glucose blood test strip Use as instructed  . lisinopril-hydrochlorothiazide (ZESTORETIC) 20-12.5 MG tablet Take 1 tablet by mouth daily.  . metFORMIN (GLUCOPHAGE) 1000 MG tablet Take 1 tablet (1,000 mg total) by mouth 2 (two) times daily.  Marland Kitchen venlafaxine XR (EFFEXOR-XR) 150 MG 24 hr capsule Take 1 capsule (150 mg total) by mouth daily with breakfast.    PHQ 2/9  Scores 06/26/2020 06/16/2020 05/14/2020 05/07/2020  PHQ - 2 Score 0 0 0 0  PHQ- 9 Score 0 0 0 -    GAD 7 : Generalized Anxiety Score 06/26/2020 06/16/2020 05/14/2020 10/29/2019  Nervous, Anxious, on Edge 0 0 0 0  Control/stop worrying 0 0 0 0  Worry too much - different things 0 0 0 0  Trouble relaxing 0 1 0 0  Restless 0 0 0 0  Easily annoyed or irritable 0 1 0 0  Afraid - awful might happen 0 0 0 0  Total GAD 7 Score 0 2 0 0  Anxiety Difficulty - Somewhat difficult - Not difficult at all    BP Readings from Last 3 Encounters:  07/10/20 120/86  06/26/20 136/86  06/19/20 (!) 143/88    Physical Exam Vitals and nursing note reviewed.  HENT:     Head: Normocephalic.     Right Ear: Tympanic membrane, ear canal and external ear normal. There is no impacted cerumen.     Left Ear: Tympanic membrane, ear canal and external ear normal. There is no impacted cerumen.     Nose: Nose normal. No congestion or rhinorrhea.     Mouth/Throat:     Mouth: Mucous membranes are moist.  Eyes:     General: No visual field deficit or scleral icterus.       Right eye: No discharge.        Left eye: No discharge.     Conjunctiva/sclera: Conjunctivae normal.     Pupils: Pupils are equal, round, and reactive to light.  Neck:     Thyroid: No thyromegaly.     Vascular: No JVD.     Trachea: No tracheal deviation.  Cardiovascular:     Rate and Rhythm: Normal rate and regular rhythm.     Heart sounds: Normal heart sounds. No murmur heard. No friction rub. No gallop.   Pulmonary:     Effort: No respiratory distress.     Breath sounds: Normal breath sounds. No wheezing, rhonchi or rales.  Abdominal:     General: Bowel sounds are normal.     Palpations: Abdomen is soft. There is no mass.     Tenderness: There is no abdominal tenderness. There is no right CVA tenderness, left CVA tenderness, guarding or rebound.  Musculoskeletal:        General: No tenderness. Normal range of motion.     Cervical back: Normal  range of motion and neck supple.  Lymphadenopathy:     Cervical: No cervical adenopathy.  Skin:    General: Skin is warm.     Findings: Bruising present. No rash.  Neurological:     Mental Status: He is alert and oriented to person, place, and time.     Cranial Nerves: Cranial nerve deficit and facial asymmetry present. No dysarthria.     Sensory: Sensation is intact. No sensory deficit.     Motor: Motor function is intact. No weakness, tremor, atrophy or abnormal muscle tone.     Deep  Tendon Reflexes: Reflexes are normal and symmetric.     Wt Readings from Last 3 Encounters:  07/10/20 187 lb (84.8 kg)  06/26/20 186 lb (84.4 kg)  06/19/20 186 lb (84.4 kg)    BP 120/86   Pulse (!) 58   Ht 6\' 1"  (1.854 m)   Wt 187 lb (84.8 kg)   BMI 24.67 kg/m   Assessment and Plan:  1. Post concussion syndrome Patient is post concussion syndrome with recovering.  Patient is doing well as far as cognitive but there is been some noticing of some facial asymmetry when he smiles.  I am not aware that I saw this on the last exam however upon review of the CTA of the head and neck I do not see any evidence of concern however we will recheck in 2 weeks sign a release of information to see if this was evident on admission in [place] for his concussion and syncopal episode.  2. Contusion of left thigh, subsequent encounter New onset.  Resolving.  Stable.  There is been gradual reduction of the hematoma of the left lateral thigh.  There is no tenderness over the area and no residual ecchymoses.  3. Facial asymmetry On cranial nerve exam it was noted that patient has a symmetrical mouth movement with me be a slight ptosis of the right eye.  Extraocular motions are okay as is the remainder of the cranial nerve exam.  There is no distal weakness.  We will recheck patient in 2 weeks at which time we will evaluate and consider whether or not if it is continued to persist we will consider neurology consult with  the possibility of MRSA.  Patient is also been instructed to bring any photos that are within the year prior to the accident that he is smiling to see if this is of new onset.

## 2020-07-17 ENCOUNTER — Other Ambulatory Visit: Payer: Self-pay

## 2020-07-17 ENCOUNTER — Ambulatory Visit (INDEPENDENT_AMBULATORY_CARE_PROVIDER_SITE_OTHER): Payer: PPO

## 2020-07-17 DIAGNOSIS — Q670 Congenital facial asymmetry: Secondary | ICD-10-CM

## 2020-07-17 DIAGNOSIS — R55 Syncope and collapse: Secondary | ICD-10-CM

## 2020-07-17 DIAGNOSIS — F0781 Postconcussional syndrome: Secondary | ICD-10-CM

## 2020-07-17 LAB — ECHOCARDIOGRAM COMPLETE
AR max vel: 3.43 cm2
AV Area VTI: 3.75 cm2
AV Area mean vel: 3.47 cm2
AV Mean grad: 2 mmHg
AV Peak grad: 4.2 mmHg
Ao pk vel: 1.02 m/s
Area-P 1/2: 1.69 cm2
Calc EF: 51.2 %
S' Lateral: 3.9 cm
Single Plane A2C EF: 51.3 %
Single Plane A4C EF: 51 %

## 2020-07-17 NOTE — Progress Notes (Unsigned)
Ref placed to neurology

## 2020-07-24 ENCOUNTER — Other Ambulatory Visit: Payer: Self-pay

## 2020-07-24 ENCOUNTER — Encounter: Payer: Self-pay | Admitting: Family Medicine

## 2020-07-24 ENCOUNTER — Ambulatory Visit (INDEPENDENT_AMBULATORY_CARE_PROVIDER_SITE_OTHER): Payer: PPO | Admitting: Family Medicine

## 2020-07-24 VITALS — BP 120/80 | HR 80 | Ht 73.0 in | Wt 184.0 lb

## 2020-07-24 DIAGNOSIS — F0781 Postconcussional syndrome: Secondary | ICD-10-CM

## 2020-07-24 DIAGNOSIS — Q670 Congenital facial asymmetry: Secondary | ICD-10-CM

## 2020-07-24 DIAGNOSIS — R42 Dizziness and giddiness: Secondary | ICD-10-CM | POA: Diagnosis not present

## 2020-07-24 NOTE — Progress Notes (Signed)
Date:  07/24/2020   Name:  Andrew [name]   DOB:  [birth year]   MRN:  [id number]   Chief Complaint: Follow-up  Patient is a 70 year old male who presents for a followup concussion exam. The patient reports the following problems: improvement/vertigo. Health maintenance has been reviewed up to date.   Lab Results  Component Value Date   CREATININE 1.00 05/02/2020   BUN 20 05/02/2020   NA 143 05/02/2020   K 3.5 05/02/2020   CL 99 05/02/2020   CO2 30 05/02/2020   Lab Results  Component Value Date   CHOL 131 05/14/2020   HDL 45 05/14/2020   LDLCALC 65 05/14/2020   TRIG 119 05/14/2020   CHOLHDL 3.3 07/31/2018   No results found for: TSH Lab Results  Component Value Date   HGBA1C 5.9 (H) 05/14/2020   Lab Results  Component Value Date   WBC 6.2 05/02/2020   HGB 16.1 05/02/2020   HCT 46.5 05/02/2020   MCV 95.1 05/02/2020   PLT 300 05/02/2020   No results found for: ALT, AST, GGT, ALKPHOS, BILITOT   Review of Systems  Constitutional: Negative for chills and fever.  HENT: Negative for drooling, ear discharge, ear pain and sore throat.   Respiratory: Negative for cough, shortness of breath and wheezing.   Cardiovascular: Negative for chest pain, palpitations and leg swelling.  Gastrointestinal: Negative for abdominal pain, blood in stool, constipation, diarrhea and nausea.  Endocrine: Negative for polydipsia.  Genitourinary: Negative for dysuria, frequency, hematuria and urgency.  Musculoskeletal: Negative for back pain, myalgias and neck pain.  Skin: Negative for rash.  Allergic/Immunologic: Negative for environmental allergies.  Neurological: Negative for dizziness and headaches.  Hematological: Does not bruise/bleed easily.  Psychiatric/Behavioral: Negative for suicidal ideas. The patient is not nervous/anxious.     Patient Active Problem List   Diagnosis Date Noted  . Aortic atherosclerosis ([place]) 05/02/2020  . Screening for colon cancer   . Aortic dissection  proximal to innominate ([place]) 12/23/2018  . Mixed hyperlipidemia 10/05/2016  . Familial multiple lipoprotein-type hyperlipidemia 08/02/2014  . Routine general medical examination at a health care facility 08/02/2014  . Recurrent major depressive episodes ([place]) 08/02/2014  . Essential (primary) hypertension 08/02/2014  . Overweight 08/02/2014  . Diabetes mellitus, type 2 ([place]) 08/02/2014    No Known Allergies  Past Surgical History:  Procedure Laterality Date  . COLONOSCOPY WITH PROPOFOL N/A 07/13/2019   Procedure: COLONOSCOPY WITH PROPOFOL;  Surgeon: [name], MD;  Location: [facility] ENDOSCOPY;  Service: Endoscopy;  Laterality: N/A;  . NASAL SINUS SURGERY    . REPAIR OF ACUTE ASCENDING THORACIC AORTIC DISSECTION  12/2018    Social History   Tobacco Use  . Smoking status: Never Smoker  . Smokeless tobacco: Never Used  . Tobacco comment: smoking cessation materials not required  Vaping Use  . Vaping Use: Never used  Substance Use Topics  . Alcohol use: Not Currently    Alcohol/week: 5.0 standard drinks    Types: 2 Glasses of wine, 3 Cans of beer per week    Comment: weekly  . Drug use: No     Medication list has been reviewed and updated.  Current Meds  Medication Sig  . amLODipine (NORVASC) 10 MG tablet Take 1 tablet (10 mg total) by mouth daily.  . clopidogrel (PLAVIX) 75 MG tablet Take 75 mg by mouth every morning.  Marland Kitchen glipiZIDE (GLUCOTROL XL) 2.5 MG 24 hr tablet Take 1 tablet (2.5 mg total) by mouth  daily with breakfast.  . glucose blood test strip Use as instructed  . lisinopril-hydrochlorothiazide (ZESTORETIC) 20-12.5 MG tablet Take 1 tablet by mouth daily.  . metFORMIN (GLUCOPHAGE) 1000 MG tablet Take 1 tablet (1,000 mg total) by mouth 2 (two) times daily.  Marland Kitchen venlafaxine XR (EFFEXOR-XR) 150 MG 24 hr capsule Take 1 capsule (150 mg total) by mouth daily with breakfast.    PHQ 2/9 Scores 07/24/2020 06/26/2020 06/16/2020 05/14/2020  PHQ - 2 Score 0 0 0 0  PHQ- 9 Score 0  0 0 0    GAD 7 : Generalized Anxiety Score 07/24/2020 06/26/2020 06/16/2020 05/14/2020  Nervous, Anxious, on Edge 0 0 0 0  Control/stop worrying 0 0 0 0  Worry too much - different things 0 0 0 0  Trouble relaxing 0 0 1 0  Restless 0 0 0 0  Easily annoyed or irritable 0 0 1 0  Afraid - awful might happen 0 0 0 0  Total GAD 7 Score 0 0 2 0  Anxiety Difficulty - - Somewhat difficult -    BP Readings from Last 3 Encounters:  07/24/20 120/80  07/10/20 120/86  06/26/20 136/86    Physical Exam Vitals and nursing note reviewed.  HENT:     Head: Normocephalic.     Right Ear: External ear normal.     Left Ear: External ear normal.     Nose: Nose normal.  Eyes:     General: No scleral icterus.       Right eye: No discharge.        Left eye: No discharge.     Conjunctiva/sclera: Conjunctivae normal.     Pupils: Pupils are equal, round, and reactive to light.  Neck:     Thyroid: No thyromegaly.     Vascular: No JVD.     Trachea: No tracheal deviation.  Cardiovascular:     Rate and Rhythm: Normal rate and regular rhythm.     Heart sounds: Normal heart sounds. No murmur heard. No friction rub. No gallop.   Pulmonary:     Effort: No respiratory distress.     Breath sounds: Normal breath sounds. No wheezing or rales.  Abdominal:     General: Bowel sounds are normal.     Palpations: Abdomen is soft. There is no mass.     Tenderness: There is no abdominal tenderness. There is no guarding or rebound.  Musculoskeletal:        General: No tenderness. Normal range of motion.     Cervical back: Normal range of motion and neck supple.  Lymphadenopathy:     Cervical: No cervical adenopathy.  Skin:    General: Skin is warm.     Findings: No rash.  Neurological:     Mental Status: He is alert and oriented to person, place, and time.     Cranial Nerves: No cranial nerve deficit.     Deep Tendon Reflexes: Reflexes are normal and symmetric.     Wt Readings from Last 3 Encounters:   07/24/20 184 lb (83.5 kg)  07/10/20 187 lb (84.8 kg)  06/26/20 186 lb (84.4 kg)    BP 120/80   Pulse 80   Ht 6\' 1"  (1.854 m)   Wt 184 lb (83.5 kg)   BMI 24.28 kg/m   Assessment and Plan: 1. Post concussion syndrome New onset.  Persistent.  Relatively stable.  Patient still has some level of confusion, facial asymmetry, and episodic vertigo status post concussion in [place].  He  has an appointment with neurology on May 4 that patient may or may not have canceled but he does not remember.  The wife says that he keeps canceling these appointments and then accusing the wife of making him go to all these different people and he does not want to go.  2. Facial asymmetry facial asymmetry is persistent status postconcussion. There is a persistence of the facial asymmetry and I asked patient if there was no photographs of him prior to the concussion is showed some smiling which we could check to see if this is demonstrated.  Patient says that there are no photographs of him prior to the concussion smiling which I find unlikely we will contact the wife to see if she can find any.  3. Vertigo Patient only has the vertigo when he is lying supine and he extends his neck and looks this may be vertebrobasilar insufficiency of the is doing but he is very specific about the vertigo and this would suggest a benign positional vertigo.  It is controlled with meclizine but we cannot continue to put this down as that this was not present prior to the incident.

## 2020-07-24 NOTE — Patient Instructions (Signed)
How to Perform the Epley Maneuver The Epley maneuver is an exercise that relieves symptoms of vertigo. Vertigo is the feeling that you or your surroundings are moving when they are not. When you feel vertigo, you may feel like the room is spinning and may have trouble walking. The Epley maneuver is used for a type of vertigo caused by a calcium deposit in a part of the inner ear. The maneuver involves changing head positions to help the deposit move out of the area. You can do this maneuver at home whenever you have symptoms of vertigo. You can repeat it in 24 hours if your vertigo has not gone away. Even though the Epley maneuver may relieve your vertigo for a few weeks, it is possible that your symptoms will return. This maneuver relieves vertigo, but it does not relieve dizziness. What are the risks? If it is done correctly, the Epley maneuver is considered safe. Sometimes it can lead to dizziness or nausea that goes away after a short time. If you develop other symptoms--such as changes in vision, weakness, or numbness--stop doing the maneuver and call your health care provider. Supplies needed:  A bed or table.  A pillow. How to do the Epley maneuver 1. Sit on the edge of a bed or table with your back straight and your legs extended or hanging over the edge of the bed or table. 2. Turn your head halfway toward the affected ear or side as told by your health care provider. 3. Lie backward quickly with your head turned until you are lying flat on your back. You may want to position a pillow under your shoulders. 4. Hold this position for at least 30 seconds. If you feel dizzy or have symptoms of vertigo, continue to hold the position until the symptoms stop. 5. Turn your head to the opposite direction until your unaffected ear is facing the floor. 6. Hold this position for at least 30 seconds. If you feel dizzy or have symptoms of vertigo, continue to hold the position until the symptoms  stop. 7. Turn your whole body to the same side as your head so that you are positioned on your side. Your head will now be nearly facedown. Hold for at least 30 seconds. If you feel dizzy or have symptoms of vertigo, continue to hold the position until the symptoms stop. 8. Sit back up. You can repeat the maneuver in 24 hours if your vertigo does not go away.      Follow these instructions at home: For 24 hours after doing the Epley maneuver:  Keep your head in an upright position.  When lying down to sleep or rest, keep your head raised (elevated) with two or more pillows.  Avoid excessive neck movements. Activity  Do not drive or use machinery if you feel dizzy.  After doing the Epley maneuver, return to your normal activities as told by your health care provider. Ask your health care provider what activities are safe for you. General instructions  Drink enough fluid to keep your urine pale yellow.  Do not drink alcohol.  Take over-the-counter and prescription medicines only as told by your health care provider.  Keep all follow-up visits as told by your health care provider. This is important. Preventing vertigo symptoms Ask your health care provider if there is anything you should do at home to prevent vertigo. He or she may recommend that you:  Keep your head elevated with two or more pillows while you sleep.    Do not sleep on the side of your affected ear.  Get up slowly from bed.  Avoid sudden movements during the day.  Avoid extreme head positions or movement, such as looking up or bending over. Contact a health care provider if:  Your vertigo gets worse.  You have other symptoms, including: ? Nausea. ? Vomiting. ? Headache. Get help right away if you:  Have vision changes.  Have a headache or neck pain that is severe or getting worse.  Cannot stop vomiting.  Have new numbness or weakness in any part of your body. Summary  Vertigo is the feeling that  you or your surroundings are moving when they are not.  The Epley maneuver is an exercise that relieves symptoms of vertigo.  If the Epley maneuver is done correctly, it is considered safe and relieves vertigo quickly. This information is not intended to replace advice given to you by your health care provider. Make sure you discuss any questions you have with your health care provider. Document Revised: 01/10/2019 Document Reviewed: 01/10/2019 Elsevier Patient Education  2021 [employer]  

## 2020-07-30 ENCOUNTER — Telehealth: Payer: Self-pay | Admitting: Family Medicine

## 2020-07-30 DIAGNOSIS — R4189 Other symptoms and signs involving cognitive functions and awareness: Secondary | ICD-10-CM | POA: Diagnosis not present

## 2020-07-30 DIAGNOSIS — R292 Abnormal reflex: Secondary | ICD-10-CM | POA: Diagnosis not present

## 2020-07-30 DIAGNOSIS — R55 Syncope and collapse: Secondary | ICD-10-CM | POA: Diagnosis not present

## 2020-07-30 LAB — HM DIABETES EYE EXAM

## 2020-07-30 NOTE — Telephone Encounter (Signed)
Patient called to ask the doctor to call regarding his medications, Plavix and Lipitor.  Please call to discuss at [phone]

## 2020-07-31 ENCOUNTER — Ambulatory Visit: Payer: PPO | Admitting: Cardiology

## 2020-07-31 NOTE — Telephone Encounter (Signed)
Called and left message for return call

## 2020-08-01 ENCOUNTER — Other Ambulatory Visit (HOSPITAL_COMMUNITY): Payer: Self-pay | Admitting: Neurology

## 2020-08-01 ENCOUNTER — Other Ambulatory Visit: Payer: Self-pay | Admitting: Neurology

## 2020-08-01 DIAGNOSIS — R292 Abnormal reflex: Secondary | ICD-10-CM

## 2020-08-05 ENCOUNTER — Telehealth: Payer: Self-pay | Admitting: Pharmacist

## 2020-08-05 NOTE — Chronic Care Management (AMB) (Signed)
Chronic Care Management Pharmacy Assistant   Name: Andrew [name]  MRN: [id number] DOB: 08-Dec-1950   Reason for Encounter: Disease State-Hypertension and Diabetes Mellitus    Recent office visits:  07/24/20- Andrew Miu, MD (PCP)post concussion syndrome 07/10/20- Andrew Miu, MD(PCP)-concussion follow up, return in 2 weeks 06/26/20- Andrew Miu, MD(PCP)-follow up concussion, rerturn in 2 weeks 06/16/20- Andrew Miu, MD(PCP)-follow up syncope,follow up in 2 weeks  Recent consult visits:  05/04/22[facility] Andrew [name](Neurology)-start memantine 5 mg at [name], MRI c-spine ordered, return in 1 month 06/19/20-Andrew [name]- Andrew Night, MD(Cardiology)-Syncope, order for echo, wear a heart monitor x 2 weeks, follow up in 6 weeks  Hospital visits:  Medication Reconciliation was completed by comparing discharge summary, patient's EMR and Pharmacy list, and upon discussion with patient.  Admitted to the hospital on 06/08/20 due to Syncope . Discharge date was not noted. Discharged from [facility]?Medications Started at [facility] Discharge:?? -started None noted  Medication Changes at Hospital Discharge: -Changed None noted  Medications Discontinued at Hospital Discharge: -Stopped None noted  Medications that remain the same after Hospital Discharge:??  -All other medications will remain the same.    Medications: Outpatient Encounter Medications as of 08/05/2020  Medication Sig  . amLODipine (NORVASC) 10 MG tablet Take 1 tablet (10 mg total) by mouth daily.  . clopidogrel (PLAVIX) 75 MG tablet Take 75 mg by mouth every morning.  Marland Kitchen glipiZIDE (GLUCOTROL XL) 2.5 MG 24 hr tablet Take 1 tablet (2.5 mg total) by mouth daily with breakfast.  . glucose blood test strip Use as instructed  . lisinopril-hydrochlorothiazide (ZESTORETIC) 20-12.5 MG tablet Take 1 tablet by mouth daily.  . metFORMIN (GLUCOPHAGE) 1000 MG tablet Take 1 tablet (1,000 mg total) by  mouth 2 (two) times daily.  Marland Kitchen venlafaxine XR (EFFEXOR-XR) 150 MG 24 hr capsule Take 1 capsule (150 mg total) by mouth daily with breakfast.   No facility-administered encounter medications on file as of 08/05/2020.   Recent Relevant Labs: Lab Results  Component Value Date/Time   HGBA1C 5.9 (H) 05/14/2020 11:11 AM   HGBA1C 5.9 (H) 06/28/2019 03:28 PM    Kidney Function Lab Results  Component Value Date/Time   CREATININE 1.00 05/02/2020 11:25 AM   CREATININE 0.90 06/28/2019 03:28 PM   GFRNONAA >60 05/02/2020 11:25 AM   GFRAA 101 06/28/2019 03:28 PM    . Current antihyperglycemic regimen:  o Glipizide 2.5 mg take 1 tablet daily o Metformin 1000 mg take 1 tablet two times daily  . What recent interventions/DTPs have been made to improve glycemic control:  o None noted  . Have there been any recent hospitalizations or ED visits since last visit with CPP? Yes   . Patient  hypoglycemic symptoms, including   . Patient  hyperglycemic symptoms, including   . How often are you checking your blood sugar?   . What are your blood sugars ranging?  o Fasting:  o Before meals:  o After meals:  o Bedtime:   . During the week, how often does your blood glucose drop below 70?   Marland Kitchen Are you checking your feet daily/regularly?   Adherence Review: Is the patient currently on a STATIN medication? No Is the patient currently on ACE/ARB medication? Yes Does the patient have >5 day gap between last estimated fill dates? No    Reviewed chart prior to disease state call. Spoke with patient regarding BP  Recent Office Vitals: BP Readings from Last 3  Encounters:  07/24/20 120/80  07/10/20 120/86  06/26/20 136/86   Pulse Readings from Last 3 Encounters:  07/24/20 80  07/10/20 (!) 58  06/26/20 72    Wt Readings from Last 3 Encounters:  07/24/20 184 lb (83.5 kg)  07/10/20 187 lb (84.8 kg)  06/26/20 186 lb (84.4 kg)     Kidney Function Lab Results  Component Value Date/Time    CREATININE 1.00 05/02/2020 11:25 AM   CREATININE 0.90 06/28/2019 03:28 PM   GFRNONAA >60 05/02/2020 11:25 AM   GFRAA 101 06/28/2019 03:28 PM    BMP Latest Ref Rng & Units 05/02/2020 06/28/2019 01/31/2019  Glucose 70 - 99 mg/dL 184(H) 95 100(H)  BUN 8 - 23 mg/dL 20 22 23   Creatinine 0.61 - 1.24 mg/dL 1.00 0.90 0.94  BUN/Creat Ratio 10 - 24 - 24 24  Sodium 135 - 145 mmol/L 143 142 143  Potassium 3.5 - 5.1 mmol/L 3.5 4.1 5.1  Chloride 98 - 111 mmol/L 99 101 99  CO2 22 - 32 mmol/L 30 26 25   Calcium 8.9 - 10.3 mg/dL 9.5 10.3(H) 10.4(H)    . Current antihypertensive regimen:  o Amlodipine 10 mg take 1 tablet daily o Lisinopril-hctz 20-12.5 mg Take 1 tablet daily  . How often are you checking your Blood Pressure?  . Current home BP readings:   . What recent interventions/DTPs have been made by any provider to improve Blood Pressure control since last CPP Visit:   . Any recent hospitalizations or ED visits since last visit with CPP? Yes   . What diet changes have been made to improve Blood Pressure Control?  o   . What exercise is being done to improve your Blood Pressure Control?  o    Adherence Review: Is the patient currently on ACE/ARB medication? Yes Does the patient have >5 day gap between last estimated fill dates? No       Star Rating Drugs: Glipizide 2.5 mg last filled 08/04/20 90 DS Lisinopril-hydrochlorothiazide 20-12.5 mg last filled 06/04/20 90 DS Metformin 1000 mg last filled 06/19/20 90 DS  [facility] Clinical Pharmacist Assistant [phone]

## 2020-08-06 ENCOUNTER — Telehealth: Payer: Self-pay | Admitting: Family Medicine

## 2020-08-06 DIAGNOSIS — E119 Type 2 diabetes mellitus without complications: Secondary | ICD-10-CM

## 2020-08-06 DIAGNOSIS — E7801 Familial hypercholesterolemia: Secondary | ICD-10-CM

## 2020-08-06 NOTE — Telephone Encounter (Signed)
Pt called back to report that this medication gives him cramps so he cannot take it. He feels like the scarecrow from the [place] when he takes this.

## 2020-08-06 NOTE — Telephone Encounter (Signed)
Notes to clinic:  Medication requested was d/c Review for continued use    Requested Prescriptions  Pending Prescriptions Disp Refills   atorvastatin (LIPITOR) 40 MG tablet [Pharmacy Med Name: ATORVASTATIN 40 MG TABLET] 90 tablet 1    Sig: TAKE 1 TABLET BY MOUTH EVERY DAY      Cardiovascular:  Antilipid - Statins Passed - 08/06/2020 10:45 AM      Passed - Total Cholesterol in normal range and within 360 days    Cholesterol, Total  Date Value Ref Range Status  05/14/2020 131 100 - 199 mg/dL Final          Passed - LDL in normal range and within 360 days    LDL Chol Calc (NIH)  Date Value Ref Range Status  05/14/2020 65 0 - 99 mg/dL Final          Passed - HDL in normal range and within 360 days    HDL  Date Value Ref Range Status  05/14/2020 45 >39 mg/dL Final          Passed - Triglycerides in normal range and within 360 days    Triglycerides  Date Value Ref Range Status  05/14/2020 119 0 - 149 mg/dL Final          Passed - Patient is not pregnant      Passed - Valid encounter within last 12 months    Recent Outpatient Visits           1 week ago Post concussion syndrome   [facility] Juline Patch, MD   3 weeks ago Post concussion syndrome   [facility] Juline Patch, MD   1 month ago Post concussion syndrome   [facility] Juline Patch, MD   1 month ago Syncope, unspecified syncope type   [facility] Juline Patch, MD   2 months ago Type 2 diabetes mellitus without complication, without long-term current use of insulin ([place])   [facility] Juline Patch, MD       Future Appointments             In 1 month Juline Patch, MD [facility]              Signed Prescriptions Disp Refills   ONETOUCH VERIO test strip 100 strip 1    Sig: TEST ONCE DAILY      Endocrinology: Diabetes - Testing Supplies Passed - 08/06/2020 10:45 AM      Passed - Valid encounter within last 12  months    Recent Outpatient Visits           1 week ago Post concussion syndrome   [facility] Juline Patch, MD   3 weeks ago Post concussion syndrome   [facility] Juline Patch, MD   1 month ago Post concussion syndrome   [facility] Juline Patch, MD   1 month ago Syncope, unspecified syncope type   [facility] Juline Patch, MD   2 months ago Type 2 diabetes mellitus without complication, without long-term current use of insulin ([place])   [facility] Juline Patch, MD       Future Appointments             In 1 month Juline Patch, MD [facility]

## 2020-08-07 ENCOUNTER — Other Ambulatory Visit: Payer: Self-pay

## 2020-08-07 ENCOUNTER — Ambulatory Visit
Admission: RE | Admit: 2020-08-07 | Discharge: 2020-08-07 | Disposition: A | Discharge status: Home / Self Care | Payer: PPO | Source: Ambulatory Visit | Attending: Neurology | Admitting: Neurology

## 2020-08-07 DIAGNOSIS — R42 Dizziness and giddiness: Secondary | ICD-10-CM | POA: Diagnosis not present

## 2020-08-07 DIAGNOSIS — R292 Abnormal reflex: Secondary | ICD-10-CM | POA: Diagnosis not present

## 2020-08-07 DIAGNOSIS — M4803 Spinal stenosis, cervicothoracic region: Secondary | ICD-10-CM | POA: Diagnosis not present

## 2020-08-07 DIAGNOSIS — M50222 Other cervical disc displacement at C5-C6 level: Secondary | ICD-10-CM | POA: Diagnosis not present

## 2020-08-07 DIAGNOSIS — M50223 Other cervical disc displacement at C6-C7 level: Secondary | ICD-10-CM | POA: Diagnosis not present

## 2020-08-13 ENCOUNTER — Other Ambulatory Visit: Payer: Self-pay | Admitting: Family Medicine

## 2020-08-13 DIAGNOSIS — I10 Essential (primary) hypertension: Secondary | ICD-10-CM

## 2020-08-18 DIAGNOSIS — S060X1S Concussion with loss of consciousness of 30 minutes or less, sequela: Secondary | ICD-10-CM | POA: Diagnosis not present

## 2020-08-18 DIAGNOSIS — R4189 Other symptoms and signs involving cognitive functions and awareness: Secondary | ICD-10-CM | POA: Diagnosis not present

## 2020-08-28 DIAGNOSIS — S060X1S Concussion with loss of consciousness of 30 minutes or less, sequela: Secondary | ICD-10-CM | POA: Diagnosis not present

## 2020-08-28 DIAGNOSIS — R4189 Other symptoms and signs involving cognitive functions and awareness: Secondary | ICD-10-CM | POA: Diagnosis not present

## 2020-09-12 DIAGNOSIS — G3184 Mild cognitive impairment, so stated: Secondary | ICD-10-CM | POA: Diagnosis not present

## 2020-09-12 DIAGNOSIS — S060X1S Concussion with loss of consciousness of 30 minutes or less, sequela: Secondary | ICD-10-CM | POA: Diagnosis not present

## 2020-09-15 DIAGNOSIS — R4189 Other symptoms and signs involving cognitive functions and awareness: Secondary | ICD-10-CM | POA: Diagnosis not present

## 2020-09-15 DIAGNOSIS — F1021 Alcohol dependence, in remission: Secondary | ICD-10-CM | POA: Diagnosis not present

## 2020-09-15 DIAGNOSIS — R292 Abnormal reflex: Secondary | ICD-10-CM | POA: Diagnosis not present

## 2020-09-16 ENCOUNTER — Other Ambulatory Visit: Payer: Self-pay | Admitting: Family Medicine

## 2020-09-16 ENCOUNTER — Ambulatory Visit: Payer: PPO | Admitting: Family Medicine

## 2020-09-18 DIAGNOSIS — Z48812 Encounter for surgical aftercare following surgery on the circulatory system: Secondary | ICD-10-CM | POA: Diagnosis not present

## 2020-09-18 DIAGNOSIS — I7101 Dissection of thoracic aorta: Secondary | ICD-10-CM | POA: Diagnosis not present

## 2020-09-18 DIAGNOSIS — I10 Essential (primary) hypertension: Secondary | ICD-10-CM | POA: Diagnosis not present

## 2020-09-18 DIAGNOSIS — I714 Abdominal aortic aneurysm, without rupture: Secondary | ICD-10-CM | POA: Diagnosis not present

## 2020-09-18 DIAGNOSIS — R918 Other nonspecific abnormal finding of lung field: Secondary | ICD-10-CM | POA: Diagnosis not present

## 2020-09-30 ENCOUNTER — Other Ambulatory Visit: Payer: Self-pay

## 2020-09-30 ENCOUNTER — Ambulatory Visit (INDEPENDENT_AMBULATORY_CARE_PROVIDER_SITE_OTHER): Payer: PPO | Admitting: Family Medicine

## 2020-09-30 ENCOUNTER — Encounter: Payer: Self-pay | Admitting: Family Medicine

## 2020-09-30 VITALS — BP 112/80 | HR 64 | Ht 73.0 in | Wt 184.0 lb

## 2020-09-30 DIAGNOSIS — E119 Type 2 diabetes mellitus without complications: Secondary | ICD-10-CM

## 2020-09-30 DIAGNOSIS — F339 Major depressive disorder, recurrent, unspecified: Secondary | ICD-10-CM | POA: Diagnosis not present

## 2020-09-30 DIAGNOSIS — I10 Essential (primary) hypertension: Secondary | ICD-10-CM | POA: Diagnosis not present

## 2020-09-30 DIAGNOSIS — E7801 Familial hypercholesterolemia: Secondary | ICD-10-CM

## 2020-09-30 MED ORDER — GLIPIZIDE ER 2.5 MG PO TB24: 2.5000 mg | ORAL_TABLET | Freq: Every day | ORAL | 1 refills | Status: DC

## 2020-09-30 MED ORDER — LISINOPRIL-HYDROCHLOROTHIAZIDE 20-12.5 MG PO TABS: 1.0000 | ORAL_TABLET | Freq: Every day | ORAL | 1 refills | Status: DC

## 2020-09-30 MED ORDER — AMLODIPINE BESYLATE 10 MG PO TABS: 10.0000 mg | ORAL_TABLET | Freq: Every day | ORAL | 1 refills | Status: DC

## 2020-09-30 MED ORDER — METFORMIN HCL 1000 MG PO TABS: 1000.0000 mg | ORAL_TABLET | Freq: Two times a day (BID) | ORAL | 1 refills | Status: DC

## 2020-09-30 MED ORDER — ATORVASTATIN CALCIUM 40 MG PO TABS: 40.0000 mg | ORAL_TABLET | Freq: Every day | ORAL | 1 refills | Status: DC

## 2020-09-30 MED ORDER — VENLAFAXINE HCL ER 150 MG PO CP24: 150.0000 mg | ORAL_CAPSULE | Freq: Every day | ORAL | 1 refills | Status: DC

## 2020-09-30 NOTE — Progress Notes (Signed)
Date:  09/30/2020   Name:  Andrew [name]   DOB:  1951-03-20   MRN:  [id number]   Chief Complaint: Hypertension, Diabetes, and Hyperlipidemia  Hypertension This is a chronic problem. The current episode started more than 1 year ago. The problem has been gradually improving since onset. The problem is controlled. Pertinent negatives include no anxiety, blurred vision, chest pain, headaches, malaise/fatigue, neck pain, orthopnea, palpitations, peripheral edema, PND, shortness of breath or sweats. There are no associated agents to hypertension. Risk factors for coronary artery disease include dyslipidemia and post-menopausal state. Past treatments include ACE inhibitors, diuretics and calcium channel blockers. The current treatment provides moderate improvement. There are no compliance problems.  There is no history of angina, kidney disease, CAD/MI, CVA, heart failure, left ventricular hypertrophy, PVD or retinopathy. There is no history of chronic renal disease, a hypertension causing med or renovascular disease.  Diabetes He presents for his follow-up diabetic visit. His disease course has been stable. Pertinent negatives for hypoglycemia include no confusion, dizziness, headaches, hunger, mood changes, nervousness/anxiousness, pallor, seizures, sleepiness, speech difficulty, sweats or tremors. Pertinent negatives for diabetes include no blurred vision, no chest pain, no fatigue, no foot paresthesias, no foot ulcerations, no polydipsia, no polyphagia, no polyuria, no visual change, no weakness and no weight loss. There are no hypoglycemic complications. Symptoms are stable. There are no diabetic complications. Pertinent negatives for diabetic complications include no CVA, PVD or retinopathy. There are no known risk factors for coronary artery disease. Current diabetic treatment includes oral agent (dual therapy). He is compliant with treatment none of the time. He is following a generally healthy diet.  Meal planning includes avoidance of concentrated sweets and carbohydrate counting. An ACE inhibitor/angiotensin II receptor blocker is being taken.  Hyperlipidemia This is a chronic problem. The problem is controlled. Recent lipid tests were reviewed and are normal. He has no history of chronic renal disease, diabetes, hypothyroidism, liver disease, obesity or nephrotic syndrome. Pertinent negatives include no chest pain, focal sensory loss, focal weakness, leg pain, myalgias or shortness of breath. Current antihyperlipidemic treatment includes statins. The current treatment provides moderate improvement of lipids. Risk factors for coronary artery disease include hypertension and dyslipidemia.   Lab Results  Component Value Date   CREATININE 1.00 05/02/2020   BUN 20 05/02/2020   NA 143 05/02/2020   K 3.5 05/02/2020   CL 99 05/02/2020   CO2 30 05/02/2020   Lab Results  Component Value Date   CHOL 131 05/14/2020   HDL 45 05/14/2020   LDLCALC 65 05/14/2020   TRIG 119 05/14/2020   CHOLHDL 3.3 07/31/2018   No results found for: TSH Lab Results  Component Value Date   HGBA1C 5.9 (H) 05/14/2020   Lab Results  Component Value Date   WBC 6.2 05/02/2020   HGB 16.1 05/02/2020   HCT 46.5 05/02/2020   MCV 95.1 05/02/2020   PLT 300 05/02/2020   No results found for: ALT, AST, GGT, ALKPHOS, BILITOT   Review of Systems  Constitutional:  Negative for fatigue, malaise/fatigue and weight loss.  Eyes:  Negative for blurred vision.  Respiratory:  Negative for shortness of breath.   Cardiovascular:  Negative for chest pain, palpitations, orthopnea and PND.  Endocrine: Negative for polydipsia, polyphagia and polyuria.  Musculoskeletal:  Negative for myalgias and neck pain.  Skin:  Negative for pallor.  Neurological:  Negative for dizziness, tremors, focal weakness, seizures, speech difficulty, weakness and headaches.  Psychiatric/Behavioral:  Negative for confusion. The patient  is not  nervous/anxious.    Patient Active Problem List   Diagnosis Date Noted   Aortic atherosclerosis ([place]) 05/02/2020   Screening for colon cancer    Aortic dissection proximal to innominate ([place]) 12/23/2018   Mixed hyperlipidemia 10/05/2016   Familial multiple lipoprotein-type hyperlipidemia 08/02/2014   Routine general medical examination at a health care facility 08/02/2014   Recurrent major depressive episodes ([place]) 08/02/2014   Essential (primary) hypertension 08/02/2014   Overweight 08/02/2014   Diabetes mellitus, type 2 ([place]) 08/02/2014    No Known Allergies  Past Surgical History:  Procedure Laterality Date   COLONOSCOPY WITH PROPOFOL N/A 07/13/2019   Procedure: COLONOSCOPY WITH PROPOFOL;  Surgeon: [name], MD;  Location: [facility] ENDOSCOPY;  Service: Endoscopy;  Laterality: N/A;   NASAL SINUS SURGERY     REPAIR OF ACUTE ASCENDING THORACIC AORTIC DISSECTION  12/2018    Social History   Tobacco Use   Smoking status: Never   Smokeless tobacco: Never   Tobacco comments:    smoking cessation materials not required  Vaping Use   Vaping Use: Never used  Substance Use Topics   Alcohol use: Not Currently    Alcohol/week: 5.0 standard drinks    Types: 2 Glasses of wine, 3 Cans of beer per week    Comment: weekly   Drug use: No     Medication list has been reviewed and updated.  Current Meds  Medication Sig   amLODipine (NORVASC) 10 MG tablet Take 1 tablet (10 mg total) by mouth daily.   atorvastatin (LIPITOR) 40 MG tablet TAKE 1 TABLET BY MOUTH EVERY DAY   clopidogrel (PLAVIX) 75 MG tablet Take 75 mg by mouth every morning.   glipiZIDE (GLUCOTROL XL) 2.5 MG 24 hr tablet Take 1 tablet (2.5 mg total) by mouth daily with breakfast.   lisinopril-hydrochlorothiazide (ZESTORETIC) 20-12.5 MG tablet Take 1 tablet by mouth daily.   metFORMIN (GLUCOPHAGE) 1000 MG tablet Take 1 tablet (1,000 mg total) by mouth 2 (two) times daily.   OneTouch Delica Lancets 18A MISC TEST ONCE  DAILY   ONETOUCH VERIO test strip TEST ONCE DAILY   venlafaxine XR (EFFEXOR-XR) 150 MG 24 hr capsule Take 1 capsule (150 mg total) by mouth daily with breakfast.    PHQ 2/9 Scores 07/24/2020 06/26/2020 06/16/2020 05/14/2020  PHQ - 2 Score 0 0 0 0  PHQ- 9 Score 0 0 0 0    GAD 7 : Generalized Anxiety Score 07/24/2020 06/26/2020 06/16/2020 05/14/2020  Nervous, Anxious, on Edge 0 0 0 0  Control/stop worrying 0 0 0 0  Worry too much - different things 0 0 0 0  Trouble relaxing 0 0 1 0  Restless 0 0 0 0  Easily annoyed or irritable 0 0 1 0  Afraid - awful might happen 0 0 0 0  Total GAD 7 Score 0 0 2 0  Anxiety Difficulty - - Somewhat difficult -    BP Readings from Last 3 Encounters:  09/30/20 112/80  07/24/20 120/80  07/10/20 120/86    Physical Exam Vitals and nursing note reviewed.  HENT:     Head: Normocephalic.     Right Ear: Tympanic membrane, ear canal and external ear normal. There is no impacted cerumen.     Left Ear: Tympanic membrane, ear canal and external ear normal. There is no impacted cerumen.     Nose: Nose normal. No congestion or rhinorrhea.  Eyes:     General: No scleral icterus.  Right eye: No discharge.        Left eye: No discharge.     Conjunctiva/sclera: Conjunctivae normal.     Pupils: Pupils are equal, round, and reactive to light.  Neck:     Thyroid: No thyromegaly.     Vascular: No JVD.     Trachea: No tracheal deviation.  Cardiovascular:     Rate and Rhythm: Normal rate and regular rhythm.     Heart sounds: Normal heart sounds. No murmur heard.   No friction rub. No gallop.  Pulmonary:     Effort: No respiratory distress.     Breath sounds: Normal breath sounds. No stridor. No wheezing, rhonchi or rales.  Abdominal:     General: Bowel sounds are normal.     Palpations: Abdomen is soft. There is no mass.     Tenderness: There is no abdominal tenderness. There is no guarding or rebound.  Musculoskeletal:        General: No tenderness. Normal  range of motion.     Cervical back: Normal range of motion and neck supple.  Lymphadenopathy:     Cervical: No cervical adenopathy.  Skin:    General: Skin is warm.     Findings: No rash.  Neurological:     Mental Status: He is alert and oriented to person, place, and time.     Cranial Nerves: No cranial nerve deficit.     Deep Tendon Reflexes: Reflexes are normal and symmetric.    Wt Readings from Last 3 Encounters:  09/30/20 184 lb (83.5 kg)  07/24/20 184 lb (83.5 kg)  07/10/20 187 lb (84.8 kg)    BP 112/80   Pulse 64   Ht 6\' 1"  (1.854 m)   Wt 184 lb (83.5 kg)   BMI 24.28 kg/m   Assessment and Plan: 1. Essential (primary) hypertension Chronic.  Controlled.  Stable.  Blood pressure 112/80.  Continue amlodipine 10 mg once a day and lisinopril hydrochlorothiazide 20-12 0.5 mg once a day. - amLODipine (NORVASC) 10 MG tablet; Take 1 tablet (10 mg total) by mouth daily.  Dispense: 90 tablet; Refill: 1 - lisinopril-hydrochlorothiazide (ZESTORETIC) 20-12.5 MG tablet; Take 1 tablet by mouth daily.  Dispense: 90 tablet; Refill: 1  2. Familial hypercholesterolemia Chronic.  Controlled.  Stable.  Continue atorvastatin 40 mg once a day. - atorvastatin (LIPITOR) 40 MG tablet; Take 1 tablet (40 mg total) by mouth daily.  Dispense: 90 tablet; Refill: 1  3. Type 2 diabetes mellitus without complication, without long-term current use of insulin (HCC) Chronic.  Controlled.  Stable.  Continue glipizide XL 2.5 mg once a day, and metformin 1 g twice a day.  Will check A1c for current level of control. - HgB A1c - glipiZIDE (GLUCOTROL XL) 2.5 MG 24 hr tablet; Take 1 tablet (2.5 mg total) by mouth daily with breakfast.  Dispense: 90 tablet; Refill: 1 - metFORMIN (GLUCOPHAGE) 1000 MG tablet; Take 1 tablet (1,000 mg total) by mouth 2 (two) times daily.  Dispense: 180 tablet; Refill: 1  4. Recurrent major depressive episodes (HCC) Chronic.  Controlled.  Stable.  Continue venlafaxine XR 150 mg once  a day. - venlafaxine XR (EFFEXOR-XR) 150 MG 24 hr capsule; Take 1 capsule (150 mg total) by mouth daily with breakfast.  Dispense: 90 capsule; Refill: 1

## 2020-10-01 LAB — HEMOGLOBIN A1C
Est. average glucose Bld gHb Est-mCnc: 134 mg/dL
Hgb A1c MFr Bld: 6.3 % — ABNORMAL HIGH (ref 4.8–5.6)

## 2020-10-03 ENCOUNTER — Other Ambulatory Visit: Payer: Self-pay

## 2020-10-03 ENCOUNTER — Encounter: Payer: Self-pay | Admitting: Cardiology

## 2020-10-03 ENCOUNTER — Ambulatory Visit: Payer: PPO | Admitting: Cardiology

## 2020-10-03 VITALS — BP 130/72 | HR 70 | Ht 73.0 in | Wt 183.0 lb

## 2020-10-03 DIAGNOSIS — E7800 Pure hypercholesterolemia, unspecified: Secondary | ICD-10-CM

## 2020-10-03 DIAGNOSIS — I7101 Dissection of thoracic aorta: Secondary | ICD-10-CM | POA: Diagnosis not present

## 2020-10-03 DIAGNOSIS — R55 Syncope and collapse: Secondary | ICD-10-CM

## 2020-10-03 DIAGNOSIS — I10 Essential (primary) hypertension: Secondary | ICD-10-CM

## 2020-10-03 DIAGNOSIS — I71011 Dissection of aortic arch: Secondary | ICD-10-CM

## 2020-10-03 NOTE — Progress Notes (Signed)
Cardiology Office Note:    Date:  10/03/2020   ID:  Andrew [name], DOB 1950/10/04, MRN [id number]  PCP:  Juline Patch, MD   [place]  Cardiologist:  None  Advanced Practice Provider:  No care team member to display Electrophysiologist:  None       Referring MD: Juline Patch, MD   Chief Complaint  Patient presents with   Andrew [name]   Other    Past due 6 week follow up. Meds reviewed verbally with patient.      History of Present Illness:    Andrew [name] is a 70 y.o. male with a hx of type A aortic dissection (s/p hemiarch repair with aortic valve replacement bioprosthetic at [facility]), hypertension, hyperlipidemia, diabetes who presents for follow-up.    [place]  Has not had any further episodes since.  Cardiac monitor was placed to evaluate any significant arrhythmias, echo performed to evaluate gross structural abnormalities.  Currently feels well, denies palpitations, dizziness, chest pain.   Past Medical History:  Diagnosis Date   Depression    Diabetes mellitus without complication ([place])    Hyperlipidemia    Hypertension     Past Surgical History:  Procedure Laterality Date   COLONOSCOPY WITH PROPOFOL N/A 07/13/2019   Procedure: COLONOSCOPY WITH PROPOFOL;  Surgeon: [name], MD;  Location: [facility] ENDOSCOPY;  Service: Endoscopy;  Laterality: N/A;   NASAL SINUS SURGERY     REPAIR OF ACUTE ASCENDING THORACIC AORTIC DISSECTION  12/2018    Current Medications: Current Meds  Medication Sig   amLODipine (NORVASC) 10 MG tablet Take 1 tablet (10 mg total) by mouth daily.   aspirin EC 81 MG tablet Take 81 mg by mouth daily. Swallow whole.   atorvastatin (LIPITOR) 40 MG tablet Take 1 tablet (40 mg total) by mouth daily.   glipiZIDE (GLUCOTROL XL) 2.5 MG 24 hr tablet Take 1 tablet (2.5 mg total) by mouth daily with breakfast.   lisinopril-hydrochlorothiazide (ZESTORETIC) 20-12.5 MG tablet  Take 1 tablet by mouth daily.   memantine (NAMENDA) 5 MG tablet Take 1 tablet by mouth daily.   metFORMIN (GLUCOPHAGE) 1000 MG tablet Take 1 tablet (1,000 mg total) by mouth 2 (two) times daily.   OneTouch Delica Lancets 40J MISC TEST ONCE DAILY   ONETOUCH VERIO test strip TEST ONCE DAILY   venlafaxine XR (EFFEXOR-XR) 150 MG 24 hr capsule Take 1 capsule (150 mg total) by mouth daily with breakfast.     Allergies:   Patient has no known allergies.   Social History   Socioeconomic History   Marital status: Married    Spouse name: Not on file   Number of children: 1   Years of education: Not on file   Highest education level: Associate degree: academic program  Occupational History   Occupation: Retired  Tobacco Use   Smoking status: Never   Smokeless tobacco: Never   Tobacco comments:    smoking cessation materials not required  Vaping Use   Vaping Use: Never used  Substance and Sexual Activity   Alcohol use: Not Currently    Alcohol/week: 5.0 standard drinks    Types: 2 Glasses of wine, 3 Cans of beer per week    Comment: weekly   Drug use: No   Sexual activity: Not Currently  Other Topics Concern   Not on file  Social History Narrative   Not on file   Social  Determinants of Health   Financial Resource Strain: Low Risk    Difficulty of Paying Living Expenses: Not hard at all  Food Insecurity: No Food Insecurity   Worried About [place] in the Last Year: Never true   Ran Out of Food in the Last Year: Never true  Transportation Needs: No Transportation Needs   Lack of Transportation (Medical): No   Lack of Transportation (Non-Medical): No  Physical Activity: Sufficiently Active   Days of Exercise per Week: 5 days   Minutes of Exercise per Session: 40 min  Stress: No Stress Concern Present   Feeling of Stress : Not at all  Social Connections: Moderately Isolated   Frequency of Communication with Friends and Family: More than three times a week    Frequency of Social Gatherings with Friends and Family: Twice a week   Attends Religious Services: Never   Marine scientist or Organizations: No   Attends Music therapist: Never   Marital Status: Married     Family History: The patient's family history includes Healthy in his father; Heart disease in his mother.  ROS:   Please see the history of present illness.     All other systems reviewed and are negative.  EKGs/Labs/Other Studies Reviewed:    The following studies were reviewed today:   EKG:  EKG is  ordered today.  The ekg ordered today demonstrates normal sinus rhythm  Recent Labs: 05/02/2020: BUN 20; Creatinine, Ser 1.00; Hemoglobin 16.1; Platelets 300; Potassium 3.5; Sodium 143  Recent Lipid Panel    Component Value Date/Time   CHOL 131 05/14/2020 1111   TRIG 119 05/14/2020 1111   HDL 45 05/14/2020 1111   CHOLHDL 3.3 07/31/2018 1131   LDLCALC 65 05/14/2020 1111     Risk Assessment/Calculations:      Physical Exam:    VS:  BP 130/72 (BP Location: Left Arm, Patient Position: Sitting, Cuff Size: Normal)   Pulse 70   Ht 6\' 1"  (1.854 m)   Wt 183 lb (83 kg)   SpO2 97%   BMI 24.14 kg/m     Wt Readings from Last 3 Encounters:  10/03/20 183 lb (83 kg)  09/30/20 184 lb (83.5 kg)  07/24/20 184 lb (83.5 kg)     GEN:  Well nourished, well developed in no acute distress HEENT: Normal NECK: No JVD; No carotid bruits LYMPHATICS: No lymphadenopathy CARDIAC: RRR, no murmurs, rubs, gallops RESPIRATORY:  Clear to auscultation without rales, wheezing or rhonchi  ABDOMEN: Soft, non-tender, non-distended MUSCULOSKELETAL:  No edema; No deformity  SKIN: Warm and dry NEUROLOGIC:  Alert and oriented x 3 PSYCHIATRIC:  Normal affect   ASSESSMENT:    1. Syncope and collapse   2. 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 Primary hypertension   4. Pure hypercholesterolemia     PLAN:    In order of problems listed above:  Patient with  syncopal episode, etiology unclear.  2-week cardiac monitor with 1 episode of VT lasting 5 beats, occasional paroxysmal SVTs.  Overall benign cardiac monitor with no significant arrhythmias to suggest etiology of syncope.  Echo with preserved ejection fraction, EF 50%, impaired relaxation. History of aortic dissection status post hemiarch repair with aortic valve replacement.  Last CT in 2021 showing stable aortic root, aorta size.  Follows with [facility] surgery for frequent CT scans.  Last echo with normal functioning bioprosthetic valve, ascending aorta measuring 35 mm. Hypertension, BP controlled.  Continue current BP meds.  Hyperlipidemia, continue statin.  Follow-up yearly.      Medication Adjustments/Labs and Tests Ordered: Current medicines are reviewed at length with the patient today.  Concerns regarding medicines are outlined above.  Orders Placed This Encounter  Procedures   EKG 12-Lead    No orders of the defined types were placed in this encounter.   Patient Instructions  Medication Instructions:   Your physician recommends that you continue on your current medications as directed. Please refer to the Current Medication list given to you today.  *If you need a refill on your cardiac medications before your next appointment, please call your pharmacy*   Lab Work:  None Ordered  If you have labs (blood work) drawn today and your tests are completely normal, you will receive your results only by: [place] (if you have MyChart) OR A paper copy in the mail If you have any lab test that is abnormal or we need to change your treatment, we will call you to review the results.   Testing/Procedures:  None ordered  Follow-Up: At [facility], you and your health needs are our priority.  As part of our continuing mission to provide you with exceptional heart care, we have created designated Provider Care Teams.  These Care Teams include your primary Cardiologist  (physician) and Advanced Practice Providers (APPs -  Physician Assistants and Nurse Practitioners) who all work together to provide you with the care you need, when you need it.  We recommend signing up for the patient portal called "MyChart".  Sign up information is provided on this After Visit Summary.  MyChart is used to connect with patients for Virtual Visits (Telemedicine).  Patients are able to view lab/test results, encounter notes, upcoming appointments, etc.  Non-urgent messages can be sent to your provider as well.   To learn more about what you can do with MyChart, go to [email or web address].    Your next appointment:   12 month(s)  The format for your next appointment:   In Person  Provider:   You may see [place] [facility] [place] or one of the following Advanced Practice Providers on your designated Care Team:   Murray Hodgkins, NP Christell Faith, PA-C Marrianne Mood, PA-C Cadence Chance, [place]    Signed, Kate Sable, MD  10/03/2020 2:02 PM    [place]

## 2020-10-03 NOTE — Patient Instructions (Signed)
Medication Instructions:   Your physician recommends that you continue on your current medications as directed. Please refer to the Current Medication list given to you today.  *If you need a refill on your cardiac medications before your next appointment, please call your pharmacy*   Lab Work:  None Ordered  If you have labs (blood work) drawn today and your tests are completely normal, you will receive your results only by: [place] (if you have MyChart) OR A paper copy in the mail If you have any lab test that is abnormal or we need to change your treatment, we will call you to review the results.   Testing/Procedures:  None ordered  Follow-Up: At [facility], you and your health needs are our priority.  As part of our continuing mission to provide you with exceptional heart care, we have created designated Provider Care Teams.  These Care Teams include your primary Cardiologist (physician) and Advanced Practice Providers (APPs -  Physician Assistants and Nurse Practitioners) who all work together to provide you with the care you need, when you need it.  We recommend signing up for the patient portal called "MyChart".  Sign up information is provided on this After Visit Summary.  MyChart is used to connect with patients for Virtual Visits (Telemedicine).  Patients are able to view lab/test results, encounter notes, upcoming appointments, etc.  Non-urgent messages can be sent to your provider as well.   To learn more about what you can do with MyChart, go to [email or web address].    Your next appointment:   12 month(s)  The format for your next appointment:   In Person  Provider:   You may see [place] or one of the following Advanced Practice Providers on your designated Care Team:   Murray Hodgkins, NP Christell Faith, PA-C Marrianne Mood, PA-C Cadence [place], [place]

## 2020-10-08 ENCOUNTER — Ambulatory Visit: Payer: PPO | Attending: Neurology | Admitting: Speech Pathology

## 2020-10-08 ENCOUNTER — Other Ambulatory Visit: Payer: Self-pay

## 2020-10-08 DIAGNOSIS — IMO0001 Reserved for inherently not codable concepts without codable children: Secondary | ICD-10-CM

## 2020-10-08 DIAGNOSIS — R41841 Cognitive communication deficit: Secondary | ICD-10-CM

## 2020-10-08 DIAGNOSIS — S069X9S Unspecified intracranial injury with loss of consciousness of unspecified duration, sequela: Secondary | ICD-10-CM | POA: Diagnosis not present

## 2020-10-08 DIAGNOSIS — G3184 Mild cognitive impairment, so stated: Secondary | ICD-10-CM | POA: Diagnosis not present

## 2020-10-10 ENCOUNTER — Encounter: Payer: Self-pay | Admitting: Speech Pathology

## 2020-10-10 NOTE — Therapy (Signed)
[place] MAIN [facility] SERVICES [address] [place], [place], [address] Phone: [phone]   Fax:  [phone]  Speech Language Pathology Evaluation  Patient Details  Name: Andrew [name] MRN: [id number] Date of Birth: Dec 01, 1950 Referring Provider (SLP): Jennings Books   Encounter Date: 10/08/2020   End of Session - 10/10/20 0630     Visit Number 1    Number of Visits 25    Date for SLP Re-Evaluation 01/02/21    Authorization Type [employer]    Authorization Time Period 10/08/2020 thru 01/12/2021    Authorization - Visit Number 1    Progress Note Due on Visit 10    SLP Start Time 1000    SLP Stop Time  1100    SLP Time Calculation (min) 60 min    Activity Tolerance Patient tolerated treatment well             Past Medical History:  Diagnosis Date   Depression    Diabetes mellitus without complication ([place])    Hyperlipidemia    Hypertension     Past Surgical History:  Procedure Laterality Date   COLONOSCOPY WITH PROPOFOL N/A 07/13/2019   Procedure: COLONOSCOPY WITH PROPOFOL;  Surgeon: [name], MD;  Location: [facility] ENDOSCOPY;  Service: Endoscopy;  Laterality: N/A;   NASAL SINUS SURGERY     REPAIR OF ACUTE ASCENDING THORACIC AORTIC DISSECTION  12/2018    There were no vitals filed for this visit.   Subjective Assessment - 10/10/20 0605     Subjective pt pleasant, good historian of accident    Currently in Pain? No/denies                SLP Evaluation [facility] - 10/10/20 3295       SLP Visit Information   SLP Received On 10/08/20    Referring Provider (SLP) [name]    Onset Date 09/15/2020    Medical Diagnosis mild TBI      General Information   HPI Pt is a 70 year old adult who is referred for cognitive communication evaluation d/t Mild Cognitive Impairment status post neuropsych testing. On 06/08/2020 pt suffered a fall while in [place]. CTA (from [place] - 06/08/2020) showed focal 2.2 cm board  based outpouching at the medial aspect of the aortic arch. Pt reports worsening of cognition since then. Neuropsychological Testing was completed on 09/12/2020 which revealed low average intellectual functioning, processing speed, attention, learning, delayed  recall varied between low average and moderately impaired with executive function being the most severely impacted.    Behavioral/Cognition appropriate    Mobility Status ambulatory      Balance Screen   Has the patient fallen in the past 6 months No    Has the patient had a decrease in activity level because of a fear of falling?  No    Is the patient reluctant to leave their home because of a fear of falling?  No      Prior Functional Status   Cognitive/Linguistic Baseline Within functional limits    Type of Home House     Lives With Spouse    Available Support Family    Vocation Retired      Associate Professor   Overall Cognitive Status Impaired/Different from baseline    Area of Impairment Attention;Awareness;Problem solving    Current Attention Level Sustained    Awareness Emergent    Problem Solving Requires verbal cues    Attention Selective    Selective Attention Impaired  Selective Attention Impairment Verbal complex;Functional complex    Memory Impaired    Memory Impairment Decreased recall of new information    Awareness Impaired    Awareness Impairment Anticipatory impairment    Problem Solving Impaired    Problem Solving Impairment Verbal complex;Functional complex    Corporate treasurer;Sequencing    Reasoning Impaired    Reasoning Impairment Verbal complex;Functional complex    Sequencing Impaired    Sequencing Impairment Verbal complex;Functional complex      Auditory Comprehension   Overall Auditory Comprehension Appears within functional limits for tasks assessed      Visual Recognition/Discrimination   Discrimination Within Function Limits      Reading Comprehension   Reading Status Within  funtional limits      Expression   Primary Mode of Expression Nonverbal - written      Verbal Expression   Overall Verbal Expression Appears within functional limits for tasks assessed      Written Expression   Dominant Hand Right    Written Expression Within Functional Limits      Oral Motor/Sensory Function   Overall Oral Motor/Sensory Function Appears within functional limits for tasks assessed      Motor Speech   Overall Motor Speech Appears within functional limits for tasks assessed      Standardized Assessments   Standardized Assessments  Cognitive Linguistic Quick Test                             SLP Education - 10/10/20 0629     Education Details results of assessment, ST POC    Person(s) Educated Patient    Methods Explanation    Comprehension Verbalized understanding              SLP Short Term Goals - 10/10/20 8527       SLP SHORT TERM GOAL #1   Title With minimal cues, pt will complete semi-complex reasoning task with 90% accuracy.    Baseline moderate cues    Time 10    Period --   sessions   Status New      SLP SHORT TERM GOAL #2   Title With moderate assistance, pt will complete tasks focusing on inferential reasoning with 80% accuracy.    Baseline new goal    Time 10    Period --   sessions   Status New      SLP SHORT TERM GOAL #3   Title Pt will demonstrate selective attention for 45 minutes in a moderately distracting environment.    Baseline 30 minutes basic selective attention    Time 10    Period --   session   Status New      SLP SHORT TERM GOAL #4   Title With moderate assistance, pt will demonstrate anticipatory awareness by providing appropriate solution to hypothetical safety situation.    Baseline new goal    Time 10    Period --   sessions   Status New              SLP Long Term Goals - 10/10/20 0641       SLP LONG TERM GOAL #1   Title Pt will complete complex reasoning tasks with > 90%  ability.    Baseline new goal    Time 12    Period Months    Status New    Target Date 01/02/21      SLP LONG  TERM GOAL #2   Title Pt will demonstrate divided attention to task for 30 minute intervals.    Baseline new goal    Time 12    Period Weeks    Status New    Target Date 01/02/21      SLP LONG TERM GOAL #3   Title Pt and wife will identify cognitive communication barriers and create compensatory strategies to increase pt's functional independence within family, leisure and community activities.    Baseline new goal    Time 12    Period Weeks    Status New    Target Date 01/02/21              Plan - 10/10/20 0631     Clinical Impression Statement Pt presents with mild higher level cognitive impairments in the areas of complex selective attention, complex reasoning, abstract verbal reasoning as well as inferential reasoning. Pt given portions of the Cognitive Linguistic Quic Test as well as informal deductive reasoning and generative naming tasks. Pt required moderate cues to complete semi-complex deductive reasoning tasks as well as recall of words during generative naming tasks. [name] strengths include visuospatial abilities, story recall, auditory comprehension, verbal expression and speech intelligibility. At this time skilled St intervention is indicated to target the above mentioned deficits in an effort to improve pt's functional independence and reduce caregiver burden.    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, sequela [facility])    Problem List Patient Active Problem List   Diagnosis Date Noted   Aortic atherosclerosis ([place]) 05/02/2020   Screening for colon cancer    Aortic dissection proximal to innominate ([place]) 12/23/2018   Mixed hyperlipidemia 10/05/2016   Familial multiple lipoprotein-type hyperlipidemia 08/02/2014   Routine general medical examination at a health care facility 08/02/2014   Recurrent major depressive episodes ([place]) 08/02/2014   Essential (primary) hypertension 08/02/2014   Overweight 08/02/2014   Diabetes mellitus, type 2 ([place]) 08/02/2014   Othal Kubitz B. Rutherford Nail M.S., CCC-SLP, [place] Pathologist Rehabilitation Services Office [phone]  Stormy Fabian 10/10/2020, 6:47 AM  [place] MAIN [facility] SERVICES [address] [place], [place], [address] Phone: [phone]   Fax:  [phone]  Name: Andrew [name] MRN: [id number] Date of Birth: 1950-10-06

## 2020-10-14 ENCOUNTER — Ambulatory Visit: Payer: PPO | Admitting: Speech Pathology

## 2020-10-14 ENCOUNTER — Other Ambulatory Visit: Payer: Self-pay

## 2020-10-14 DIAGNOSIS — S069X9S Unspecified intracranial injury with loss of consciousness of unspecified duration, sequela: Secondary | ICD-10-CM

## 2020-10-14 DIAGNOSIS — IMO0001 Reserved for inherently not codable concepts without codable children: Secondary | ICD-10-CM

## 2020-10-14 DIAGNOSIS — R41841 Cognitive communication deficit: Secondary | ICD-10-CM | POA: Diagnosis not present

## 2020-10-14 DIAGNOSIS — G3184 Mild cognitive impairment, so stated: Secondary | ICD-10-CM

## 2020-10-14 NOTE — Patient Instructions (Signed)
Read Part 1 of "Soft-Wired"

## 2020-10-14 NOTE — Therapy (Signed)
[place] MAIN [facility] SERVICES [address] [place], [place], [address] Phone: [phone]   Fax:  [phone]  Speech Language Pathology Treatment  Patient Details  Name: Andrew [name] MRN: [id number] Date of Birth: 1950/07/30 Referring Provider (SLP): Jennings Books   Encounter Date: 10/14/2020   End of Session - 10/14/20 1627     Visit Number 2    Number of Visits 25    Date for SLP Re-Evaluation 01/02/21    Authorization Type [employer]    Authorization Time Period 10/08/2020 thru 01/12/2021    Authorization - Visit Number 2    Progress Note Due on Visit 10    SLP Start Time 0900    SLP Stop Time  1000    SLP Time Calculation (min) 60 min    Activity Tolerance Patient tolerated treatment well             Past Medical History:  Diagnosis Date   Depression    Diabetes mellitus without complication ([place])    Hyperlipidemia    Hypertension     Past Surgical History:  Procedure Laterality Date   COLONOSCOPY WITH PROPOFOL N/A 07/13/2019   Procedure: COLONOSCOPY WITH PROPOFOL;  Surgeon: [name], MD;  Location[facility] ENDOSCOPY;  Service: Endoscopy;  Laterality: N/A;   NASAL SINUS SURGERY     REPAIR OF ACUTE ASCENDING THORACIC AORTIC DISSECTION  12/2018    There were no vitals filed for this visit.   Subjective Assessment - 10/14/20 1608     Subjective pt pleasant, accompanied by his wife    Patient is accompained by: Family member    Currently in Pain? No/denies                   ADULT SLP TREATMENT - 10/14/20 0001       Treatment Provided   Treatment provided Cognitive-Linquistic      Cognitive-Linquistic Treatment   Treatment focused on Cognition;Patient/family/caregiver education    Skilled Treatment COGNITION: MEMORY - Internal memory strategies - independently achieve 75% accuracy when following 2-3 step directions, improving to 100% with minimal verbal cues instruction provided on verbally  rehearsing information for great accuracy; External memory strategies - taking notes on how to play a novel semi-complex card game [facility] in the [place]); disorganized use of note taking but improved with demonstration by of information that needed to be written down for accurate recall of game              SLP Education - 10/14/20 1627     Education Details internal and external memory strategies    Person(s) Educated Patient;Spouse    Methods Explanation;Demonstration;Verbal cues    Comprehension Verbalized understanding;Returned demonstration;Verbal cues required;Need further instruction              SLP Short Term Goals - 10/10/20 0637       SLP SHORT TERM GOAL #1   Title With minimal cues, pt will complete semi-complex reasoning task with 90% accuracy.    Baseline moderate cues    Time 10    Period --   sessions   Status New      SLP SHORT TERM GOAL #2   Title With moderate assistance, pt will complete tasks focusing on inferential reasoning with 80% accuracy.    Baseline new goal    Time 10    Period --   sessions   Status New      SLP SHORT TERM GOAL #3  Title Pt will demonstrate selective attention for 45 minutes in a moderately distracting environment.    Baseline 30 minutes basic selective attention    Time 10    Period --   session   Status New      SLP SHORT TERM GOAL #4   Title With moderate assistance, pt will demonstrate anticipatory awareness by providing appropriate solution to hypothetical safety situation.    Baseline new goal    Time 10    Period --   sessions   Status New              SLP Long Term Goals - 10/10/20 0641       SLP LONG TERM GOAL #1   Title Pt will complete complex reasoning tasks with > 90% ability.    Baseline new goal    Time 12    Period Months    Status New    Target Date 01/02/21      SLP LONG TERM GOAL #2   Title Pt will demonstrate divided attention to task for 30 minute intervals.    Baseline new goal     Time 12    Period Weeks    Status New    Target Date 01/02/21      SLP LONG TERM GOAL #3   Title Pt and wife will identify cognitive communication barriers and create compensatory strategies to increase pt's functional independence within family, leisure and community activities.    Baseline new goal    Time 12    Period Weeks    Status New    Target Date 01/02/21              Plan - 10/14/20 1630     Clinical Impression Statement Pt presents with mild cognitive impairments in the areas of memory, emergent awareness, semi-complex problem solving, insight. Pt eagerly participates and is receptive to instruction as well as possibilities of use external memory aides. Pt doesn't currently use any memory aids and this led to functional problem as he didn't convey information to his wife from last session. This resulted in scheduling conflicts. Skilled ST services are required to target pt's cognitive deficits in an effort to increase pt's functional independence for return to leisure and community activities.    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, sequela [facility])    Problem List Patient Active Problem List   Diagnosis Date Noted   Aortic atherosclerosis ([place]) 05/02/2020   Screening for colon cancer    Aortic dissection proximal to innominate ([place]) 12/23/2018   Mixed hyperlipidemia 10/05/2016   Familial multiple  lipoprotein-type hyperlipidemia 08/02/2014   Routine general medical examination at a health care facility 08/02/2014   Recurrent major depressive episodes ([place]) 08/02/2014   Essential (primary) hypertension 08/02/2014   Overweight 08/02/2014   Diabetes mellitus, type 2 ([place]) 08/02/2014   [place] Jodoin B. Rutherford Nail M.S., CCC-SLP, [place] Office [phone]  Stormy Fabian 10/14/2020, 4:31 PM  [place]  [facility] MAIN [facility] SERVICES [place], [place], [address] Phone: [phone]   Fax:  [phone]   Name: Andrew [name] MRN: [id number] Date of Birth: Sep 09, 1950

## 2020-10-14 NOTE — Addendum Note (Signed)
Addended by: Stormy Fabian B on: 10/14/2020 08:18 AM   Modules accepted: Orders

## 2020-10-15 ENCOUNTER — Encounter: Payer: PPO | Admitting: Speech Pathology

## 2020-10-16 ENCOUNTER — Other Ambulatory Visit: Payer: Self-pay

## 2020-10-16 ENCOUNTER — Ambulatory Visit: Payer: PPO | Admitting: Speech Pathology

## 2020-10-16 DIAGNOSIS — R41841 Cognitive communication deficit: Secondary | ICD-10-CM | POA: Diagnosis not present

## 2020-10-16 DIAGNOSIS — S069X9S Unspecified intracranial injury with loss of consciousness of unspecified duration, sequela: Secondary | ICD-10-CM

## 2020-10-16 DIAGNOSIS — IMO0001 Reserved for inherently not codable concepts without codable children: Secondary | ICD-10-CM

## 2020-10-16 DIAGNOSIS — G3184 Mild cognitive impairment, so stated: Secondary | ICD-10-CM

## 2020-10-18 NOTE — Patient Instructions (Signed)
Read SOFT WIRED, create 2 photo albums in phone, use notebook for recall

## 2020-10-18 NOTE — Therapy (Signed)
[place] MAIN [facility] SERVICES [address] [place], [place], [address] Phone: [phone]   Fax:  [phone]  Speech Language Pathology Treatment  Patient Details  Name: Andrew [name] MRN: [id number] Date of Birth: 07-12-1950 Referring Provider (SLP): Jennings Books   Encounter Date: 10/16/2020   End of Session - 10/18/20 2146     Visit Number 3    Number of Visits 25    Date for SLP Re-Evaluation 01/02/21    Authorization Type [employer]    Authorization Time Period 10/08/2020 thru 01/12/2021    Authorization - Visit Number 3    Progress Note Due on Visit 10    SLP Start Time 0900    SLP Stop Time  1000    SLP Time Calculation (min) 60 min    Activity Tolerance Patient tolerated treatment well             Past Medical History:  Diagnosis Date   Depression    Diabetes mellitus without complication ([place])    Hyperlipidemia    Hypertension     Past Surgical History:  Procedure Laterality Date   COLONOSCOPY WITH PROPOFOL N/A 07/13/2019   Procedure: COLONOSCOPY WITH PROPOFOL;  Surgeon: [name], MD;  Location: [facility] ENDOSCOPY;  Service: Endoscopy;  Laterality: N/A;   NASAL SINUS SURGERY     REPAIR OF ACUTE ASCENDING THORACIC AORTIC DISSECTION  12/2018    There were no vitals filed for this visit.   Subjective Assessment - 10/18/20 2143     Subjective pt pleasant, accompanied by his wife, "I forget how far I needed to read in the book and I forgot to bring my other book"    Patient is accompained by: Family member    Currently in Pain? No/denies                   ADULT SLP TREATMENT - 10/18/20 0001       Treatment Provided   Treatment provided Cognitive-Linquistic      Cognitive-Linquistic Treatment   Treatment focused on Cognition;Patient/family/caregiver education    Skilled Treatment COGNITION: MEMORY -notebook provided as external memory strategy to aid in recall of information; SLP provided  written examples of how to use book effectively for accurate recall; Pt also wrote down instructions on how to create photo albums to store pictures of items related to house to aid in recall; moderate cues to use written information to effectively play previously taught card game              SLP Education - 10/18/20 2144     Education Details active reading SOFT WIRED, memorybook, using cell phone as external memory aid    Person(s) Educated Patient;Spouse    Methods Explanation;Demonstration;Verbal cues;Handout    Comprehension Verbalized understanding;Need further instruction              SLP Short Term Goals - 10/10/20 [id number]       SLP SHORT TERM GOAL #1   Title With minimal cues, pt will complete semi-complex reasoning task with 90% accuracy.    Baseline moderate cues    Time 10    Period --   sessions   Status New      SLP SHORT TERM GOAL #2   Title With moderate assistance, pt will complete tasks focusing on inferential reasoning with 80% accuracy.    Baseline new goal    Time 10    Period --   sessions   Status  New      SLP SHORT TERM GOAL #3   Title Pt will demonstrate selective attention for 45 minutes in a moderately distracting environment.    Baseline 30 minutes basic selective attention    Time 10    Period --   session   Status New      SLP SHORT TERM GOAL #4   Title With moderate assistance, pt will demonstrate anticipatory awareness by providing appropriate solution to hypothetical safety situation.    Baseline new goal    Time 10    Period --   sessions   Status New              SLP Long Term Goals - 10/10/20 0641       SLP LONG TERM GOAL #1   Title Pt will complete complex reasoning tasks with > 90% ability.    Baseline new goal    Time 12    Period Months    Status New    Target Date 01/02/21      SLP LONG TERM GOAL #2   Title Pt will demonstrate divided attention to task for 30 minute intervals.    Baseline new goal    Time  12    Period Weeks    Status New    Target Date 01/02/21      SLP LONG TERM GOAL #3   Title Pt and wife will identify cognitive communication barriers and create compensatory strategies to increase pt's functional independence within family, leisure and community activities.    Baseline new goal    Time 12    Period Weeks    Status New    Target Date 01/02/21              Plan - 10/18/20 2147     Clinical Impression Statement Pt presents with mild cognitive impairments in the areas of memory, emergent awareness, semi-complex problem solving, insight. Pt eagerly participates and is receptive to instruction as well as possibilities of use external memory aides. Pt saw validity in use notebook for important information as he had forgotten how far to write in the book "Soft Wired" as well as forgetting to bring in additional book that he had. Pt will likely require further instruction on use of external memory aids and he need therapy to target internal memory strategies. Skilled ST services are required to target pt's cognitive deficits in an effort to increase pt's functional independence for return to leisure and community activities.    Speech Therapy Frequency 2x / week    Duration 12 weeks    Treatment/Interventions Patient/family education;Cognitive reorganization;Internal/external aids;SLP instruction and feedback;Compensatory strategies;Functional tasks    Potential to Achieve Goals Good    SLP Home Exercise Plan provided, see pt instruction section    Consulted and Agree with Plan of Care Patient;Family member/caregiver    Family Member Consulted pt's wife             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 Rutherford Nail M.S., CCC-SLP, [place] Pathologist Rehabilitation Services Office [phone]  Stormy Fabian 10/18/2020, 9:49 PM  [place] MAIN [facility] SERVICES [address] [place], [place], [address] Phone: [phone]   Fax:  [phone]   Name: DEVEREAUX STALLS MRN: [id number] Date of Birth: [birth year]

## 2020-10-20 ENCOUNTER — Encounter: Payer: PPO | Admitting: Speech Pathology

## 2020-10-21 ENCOUNTER — Other Ambulatory Visit: Payer: Self-pay

## 2020-10-21 ENCOUNTER — Ambulatory Visit: Payer: PPO | Admitting: Speech Pathology

## 2020-10-21 ENCOUNTER — Encounter: Payer: PPO | Admitting: Speech Pathology

## 2020-10-21 DIAGNOSIS — R41841 Cognitive communication deficit: Secondary | ICD-10-CM | POA: Diagnosis not present

## 2020-10-21 DIAGNOSIS — G3184 Mild cognitive impairment, so stated: Secondary | ICD-10-CM

## 2020-10-21 DIAGNOSIS — S069X9S Unspecified intracranial injury with loss of consciousness of unspecified duration, sequela: Secondary | ICD-10-CM

## 2020-10-21 DIAGNOSIS — IMO0001 Reserved for inherently not codable concepts without codable children: Secondary | ICD-10-CM

## 2020-10-21 NOTE — Patient Instructions (Signed)
Use external memory aid when playing card game as well as begin keeping score

## 2020-10-21 NOTE — Therapy (Signed)
[place] MAIN [facility] SERVICES [address] [place], [place], [address] Phone: [phone]   Fax:  [phone]  Speech Language Pathology Treatment  Patient Details  Name: Andrew [name] MRN: [id number] Date of Birth: 12/17/1950 Referring Provider (SLP): Jennings Books   Encounter Date: 10/21/2020   End of Session - 10/21/20 1224     Visit Number 4    Number of Visits 25    Date for SLP Re-Evaluation 01/02/21    Authorization Type [employer]    Authorization Time Period 10/08/2020 thru 01/12/2021    Authorization - Visit Number 4    Progress Note Due on Visit 10    SLP Start Time 0900    SLP Stop Time  1000    SLP Time Calculation (min) 60 min    Activity Tolerance Patient tolerated treatment well             Past Medical History:  Diagnosis Date   Depression    Diabetes mellitus without complication ([place])    Hyperlipidemia    Hypertension     Past Surgical History:  Procedure Laterality Date   COLONOSCOPY WITH PROPOFOL N/A 07/13/2019   Procedure: COLONOSCOPY WITH PROPOFOL;  Surgeon: [name], MD;  Location: [name] ENDOSCOPY;  Service: Endoscopy;  Laterality: N/A;   NASAL SINUS SURGERY     REPAIR OF ACUTE ASCENDING THORACIC AORTIC DISSECTION  12/2018    There were no vitals filed for this visit.   Subjective Assessment - 10/21/20 1221     Subjective pt pleased with new datebook, use of notebook provided by SLP    Currently in Pain? No/denies                   ADULT SLP TREATMENT - 10/21/20 0001       Treatment Provided   Treatment provided Cognitive-Linquistic      Cognitive-Linquistic Treatment   Treatment focused on Cognition;Patient/family/caregiver education    Skilled Treatment COGNITION: MEMORY - notebook reviewed with pt as well as newly acquired datebook, pt using appropriately, he remember to bring in the two books relating to [place] as well as remembering to read SOFT WIRED; maximal  assistance with example provided of using note to remind pt of steps within previously taught card game; moderate faded to minimal cues to refer to note;  when referring to note, pt's accuracy immediately increased to Mod I              SLP Education - 10/21/20 1223     Education Details external compensatory memory strategies, goal is independent use of memory aid - not independent perfection with game    Person(s) Educated Patient    Methods Explanation;Demonstration;Handout;Verbal cues    Comprehension Verbalized understanding;Need further instruction              SLP Short Term Goals - 10/10/20 [id number]       SLP SHORT TERM GOAL #1   Title With minimal cues, pt will complete semi-complex reasoning task with 90% accuracy.    Baseline moderate cues    Time 10    Period --   sessions   Status New      SLP SHORT TERM GOAL #2   Title With moderate assistance, pt will complete tasks focusing on inferential reasoning with 80% accuracy.    Baseline new goal    Time 10    Period --   sessions   Status New      SLP  SHORT TERM GOAL #3   Title Pt will demonstrate selective attention for 45 minutes in a moderately distracting environment.    Baseline 30 minutes basic selective attention    Time 10    Period --   session   Status New      SLP SHORT TERM GOAL #4   Title With moderate assistance, pt will demonstrate anticipatory awareness by providing appropriate solution to hypothetical safety situation.    Baseline new goal    Time 10    Period --   sessions   Status New              SLP Long Term Goals - 10/10/20 0641       SLP LONG TERM GOAL #1   Title Pt will complete complex reasoning tasks with > 90% ability.    Baseline new goal    Time 12    Period Months    Status New    Target Date 01/02/21      SLP LONG TERM GOAL #2   Title Pt will demonstrate divided attention to task for 30 minute intervals.    Baseline new goal    Time 12    Period Weeks     Status New    Target Date 01/02/21      SLP LONG TERM GOAL #3   Title Pt and wife will identify cognitive communication barriers and create compensatory strategies to increase pt's functional independence within family, leisure and community activities.    Baseline new goal    Time 12    Period Weeks    Status New    Target Date 01/02/21              Plan - 10/21/20 1225     Clinical Impression Statement Pt presents with mild cognitive impairments in the areas of memory, emergent awareness, semi-complex problem solving, insight. Pt demonstrated great use of notebook, acquired a datebook as well as remembered to complete HEP and bring in book to session. While he is more insightful Izora Gala had to keep telling me to double check the cards already played on the table"), he required maximal assistance with example of ways to promote increased independence with game. While he was very receptive to referring to created note, he will likely require further instruction on use of external memory aids.   Skilled ST services are required to target pt's cognitive deficits in an effort to increase pt's functional independence for return to leisure and community activities.    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, sequela [facility])    Problem List Patient Active Problem List   Diagnosis Date Noted   Aortic atherosclerosis ([place]) 05/02/2020    Screening for colon cancer    Aortic dissection proximal to innominate ([place]) 12/23/2018   Mixed hyperlipidemia 10/05/2016   Familial multiple lipoprotein-type hyperlipidemia 08/02/2014   Routine general medical examination at a health care facility 08/02/2014   Recurrent major depressive  episodes ([place]) 08/02/2014   Essential (primary) hypertension 08/02/2014   Overweight 08/02/2014   Diabetes mellitus, type 2 ([place]) 08/02/2014   Chaye Misch B. Rutherford Nail M.S., CCC-SLP, [place] Pathologist Rehabilitation Services Office [phone]   Stormy Fabian 10/21/2020, 12:26 PM  [place] MAIN [facility] SERVICES [address] [place], [place], [address] Phone: [phone]   Fax:  [phone]   Name: Andrew [name] MRN: [id number] Date of Birth: [birth year]

## 2020-10-23 ENCOUNTER — Other Ambulatory Visit: Payer: Self-pay

## 2020-10-23 ENCOUNTER — Ambulatory Visit: Payer: PPO | Admitting: Speech Pathology

## 2020-10-23 DIAGNOSIS — IMO0001 Reserved for inherently not codable concepts without codable children: Secondary | ICD-10-CM

## 2020-10-23 DIAGNOSIS — S069X9S Unspecified intracranial injury with loss of consciousness of unspecified duration, sequela: Secondary | ICD-10-CM

## 2020-10-23 DIAGNOSIS — G3184 Mild cognitive impairment, so stated: Secondary | ICD-10-CM

## 2020-10-23 DIAGNOSIS — R41841 Cognitive communication deficit: Secondary | ICD-10-CM

## 2020-10-24 ENCOUNTER — Encounter: Payer: Self-pay | Admitting: Family Medicine

## 2020-10-24 ENCOUNTER — Ambulatory Visit (INDEPENDENT_AMBULATORY_CARE_PROVIDER_SITE_OTHER): Payer: PPO | Admitting: Family Medicine

## 2020-10-24 VITALS — BP 120/78 | HR 72 | Ht 73.0 in | Wt 181.0 lb

## 2020-10-24 DIAGNOSIS — I83012 Varicose veins of right lower extremity with ulcer of calf: Secondary | ICD-10-CM

## 2020-10-24 DIAGNOSIS — L97211 Non-pressure chronic ulcer of right calf limited to breakdown of skin: Secondary | ICD-10-CM

## 2020-10-24 DIAGNOSIS — E119 Type 2 diabetes mellitus without complications: Secondary | ICD-10-CM | POA: Diagnosis not present

## 2020-10-24 MED ORDER — AMOXICILLIN-POT CLAVULANATE 875-125 MG PO TABS: 1.0000 | ORAL_TABLET | Freq: Two times a day (BID) | ORAL | 0 refills | Status: DC

## 2020-10-24 NOTE — Patient Instructions (Signed)
Venous Ulcer A venous ulcer is a shallow sore on your lower leg that is caused by poorcirculation in your veins. This condition used to be called stasis ulcer. Venous ulcer is the most common type of lower leg ulcer. You may have venous ulcers on one leg or on both legs. The area where this condition most commonly develops is around the ankles. A venous ulcer may last for a long time (chronic ulcer) or it may return repeatedly (recurrent ulcer). What are the causes? A venous ulcer may be caused by any condition that causes poor blood flow in your legs. Veins have valves that help return blood to the heart. If these valves do not work properly: Blood can flow backward and pool in the lower legs. Blood can then leak out of your veins, which can irritate your skin. Irritation can cause a break in the skin, which becomes a venous ulcer. What increases the risk? You are more likely to develop this condition if you: Are 76 years of age or older. Are male. Are overweight. Are not active. Have had a leg ulcer in the past. Have varicose veins. Have clots in your lower leg veins (deep vein thrombosis). Have inflammation of your leg veins (phlebitis). Have recently had a pregnancy. Use products that contain nicotine or tobacco. What are the signs or symptoms? The main symptom of this condition is an open sore near your ankle. Other symptoms may include: Swelling. Thickening of the skin. Fluid leaking from the ulcer. Bleeding. Itching. Pain and swelling that gets worse when you stand up and feels better when you raise your leg. Blotchy skin. Darkening of the skin. How is this diagnosed? Your health care provider may suspect a venous ulcer based on your medical history and your risk factors. He or she may: Do a physical exam. Do other tests, such as: Measuring blood pressure in your arms and legs. Using sound waves (ultrasound) to measure blood flow in your leg veins. How is this  treated? This condition may be treated by: Keeping your leg raised (elevated). Wearing a type of bandage or stocking to compress the veins of your leg (compression therapy). Taking medicines to improve blood flow. Taking antibiotic medicines to treat infection. Cleaning your ulcer and removing any dead tissue from the wound (debridement). Placing various types of medicated bandages (dressings) or medicated wraps on your ulcer. Surgery to close the wound using a piece of skin taken from another area of your body (graft). This is only done for wounds that are deep or hard to heal. You may need to try several different types of treatment to get your venousulcer to heal. Healing may take a long time. Follow these instructions at home: Medicines Take or apply over-the-counter and prescription medicines only as told by your health care provider. If you were prescribed an antibiotic medicine, take it as told by your health care provider. Do not stop using the antibiotic even if you start to feel better. Ask your health care provider if you should take aspirin before long trips. Wound care Follow instructions from your health care provider about how to take care of your wound. Make sure you: Wash your hands with soap and water before and after you change your bandage (dressing). If soap and water are not available, use hand sanitizer. Change your dressing as told by your health care provider. If you had a skin graft, leave stitches (sutures) in place. These may need to stay in place for 2 weeks or  longer. Ask when you should remove your dressing. If your dressing is dry and sticks to your leg when you try to remove it, moisten or wet the dressing with saline solution or water so that the dressing can be removed without harming your skin or wound tissue. When you are able to remove your dressing, check your wound every day for signs of infection. Have a caregiver do this for you if you are not able to do  it yourself. Check for: More redness, swelling, or pain. More fluid or blood. Warmth. Pus or a bad smell. Activity Avoid sitting for a long time without moving. Get up to take short walks every 1-2 hours. This is important to improve blood flow in your legs. Ask for help if you feel weak or unsteady. Ask your health care provider what level of activity is safe for you. Rest with your legs raised (elevated) during the day. If possible, elevate your legs above the level of your heart for 30 minutes, 3-4 times a day, or as told by your health care provider. Do not sit with your legs crossed. General instructions  Wear elastic stockings, compression stockings, or support hose as told by your health care provider. Raise the foot of your bed as told by your health care provider. Do not use any products that contain nicotine or tobacco, such as cigarettes, e-cigarettes, and chewing tobacco. If you need help quitting, ask your health care provider. Keep all follow-up visits as told by your health care provider. This is important.  Contact a health care provider if: Your ulcer is getting larger or is not healing. Your pain gets worse. Get help right away if you have: More redness, swelling, or pain around your ulcer. More fluid or blood coming from your ulcer. Warmth in the area around your ulcer. Pus or a bad smell coming from your ulcer. A fever. Summary A venous ulcer is a shallow sore on your lower leg that is caused by poor circulation in your veins. Follow instructions from your health care provider about how to take care of your wound. Check your wound every day for signs of infection. Take over-the-counter and prescription medicines only as told by your health care provider. Keep all follow-up visits as told by your health care provider. This is important. This information is not intended to replace advice given to you by your health care provider. Make sure you discuss any questions  you have with your healthcare provider. Document Revised: 11/10/2017 Document Reviewed: 11/10/2017 Elsevier Patient Education  [place].

## 2020-10-24 NOTE — Progress Notes (Signed)
Date:  10/24/2020   Name:  Andrew [name]   DOB:  03/06/1951   MRN:  [id number]   Chief Complaint: Laceration (Hits legs on door going into the garage- places on legs have been there x 1 month and not completely healing- been using triple antibiotic cream)  Laceration  The incident occurred more than 1 week ago (1 month). The laceration is located on the Left leg and right leg. The laceration is 2 cm in size. The laceration mechanism was a blunt object. The patient is experiencing no pain. The pain has been Constant since onset.   Lab Results  Component Value Date   CREATININE 1.00 05/02/2020   BUN 20 05/02/2020   NA 143 05/02/2020   K 3.5 05/02/2020   CL 99 05/02/2020   CO2 30 05/02/2020   Lab Results  Component Value Date   CHOL 131 05/14/2020   HDL 45 05/14/2020   LDLCALC 65 05/14/2020   TRIG 119 05/14/2020   CHOLHDL 3.3 07/31/2018   No results found for: TSH Lab Results  Component Value Date   HGBA1C 6.3 (H) 09/30/2020   Lab Results  Component Value Date   WBC 6.2 05/02/2020   HGB 16.1 05/02/2020   HCT 46.5 05/02/2020   MCV 95.1 05/02/2020   PLT 300 05/02/2020   No results found for: ALT, AST, GGT, ALKPHOS, BILITOT   Review of Systems  Constitutional:  Negative for chills and fever.  HENT:  Negative for drooling, ear discharge, ear pain and sore throat.   Respiratory:  Negative for cough, shortness of breath and wheezing.   Cardiovascular:  Negative for chest pain, palpitations and leg swelling.  Gastrointestinal:  Negative for abdominal pain, blood in stool, constipation, diarrhea and nausea.  Endocrine: Negative for polydipsia.  Genitourinary:  Negative for dysuria, frequency, hematuria and urgency.  Musculoskeletal:  Negative for back pain, myalgias and neck pain.  Skin:  Negative for rash.  Allergic/Immunologic: Negative for environmental allergies.  Neurological:  Negative for dizziness and headaches.  Hematological:  Does not bruise/bleed easily.   Psychiatric/Behavioral:  Negative for suicidal ideas. The patient is not nervous/anxious.    Patient Active Problem List   Diagnosis Date Noted   Aortic atherosclerosis ([place]) 05/02/2020   Screening for colon cancer    Aortic dissection proximal to innominate ([place]) 12/23/2018   Mixed hyperlipidemia 10/05/2016   Familial multiple lipoprotein-type hyperlipidemia 08/02/2014   Routine general medical examination at a health care facility 08/02/2014   Recurrent major depressive episodes ([place]) 08/02/2014   Essential (primary) hypertension 08/02/2014   Overweight 08/02/2014   Diabetes mellitus, type 2 ([place]) 08/02/2014    No Known Allergies  Past Surgical History:  Procedure Laterality Date   COLONOSCOPY WITH PROPOFOL N/A 07/13/2019   Procedure: COLONOSCOPY WITH PROPOFOL;  Surgeon: [name], MD;  Location: [facility] ENDOSCOPY;  Service: Endoscopy;  Laterality: N/A;   NASAL SINUS SURGERY     REPAIR OF ACUTE ASCENDING THORACIC AORTIC DISSECTION  12/2018    Social History   Tobacco Use   Smoking status: Never   Smokeless tobacco: Never   Tobacco comments:    smoking cessation materials not required  Vaping Use   Vaping Use: Never used  Substance Use Topics   Alcohol use: Not Currently    Alcohol/week: 5.0 standard drinks    Types: 2 Glasses of wine, 3 Cans of beer per week    Comment: weekly   Drug use: No     Medication list has  been reviewed and updated.  Current Meds  Medication Sig   amLODipine (NORVASC) 10 MG tablet Take 1 tablet (10 mg total) by mouth daily.   aspirin EC 81 MG tablet Take 81 mg by mouth daily. Swallow whole.   atorvastatin (LIPITOR) 40 MG tablet Take 1 tablet (40 mg total) by mouth daily.   glipiZIDE (GLUCOTROL XL) 2.5 MG 24 hr tablet Take 1 tablet (2.5 mg total) by mouth daily with breakfast.   lisinopril-hydrochlorothiazide (ZESTORETIC) 20-12.5 MG tablet Take 1 tablet by mouth daily.   memantine (NAMENDA) 5 MG tablet Take 1 tablet by mouth daily.    metFORMIN (GLUCOPHAGE) 1000 MG tablet Take 1 tablet (1,000 mg total) by mouth 2 (two) times daily.   OneTouch Delica Lancets 99991111 MISC TEST ONCE DAILY   ONETOUCH VERIO test strip TEST ONCE DAILY   venlafaxine XR (EFFEXOR-XR) 150 MG 24 hr capsule Take 1 capsule (150 mg total) by mouth daily with breakfast.    PHQ 2/9 Scores 10/24/2020 07/24/2020 06/26/2020 06/16/2020  PHQ - 2 Score 0 0 0 0  PHQ- 9 Score 0 0 0 0    GAD 7 : Generalized Anxiety Score 10/24/2020 07/24/2020 06/26/2020 06/16/2020  Nervous, Anxious, on Edge 0 0 0 0  Control/stop worrying 0 0 0 0  Worry too much - different things 0 0 0 0  Trouble relaxing 0 0 0 1  Restless 0 0 0 0  Easily annoyed or irritable 0 0 0 1  Afraid - awful might happen 0 0 0 0  Total GAD 7 Score 0 0 0 2  Anxiety Difficulty - - - Somewhat difficult    BP Readings from Last 3 Encounters:  10/24/20 120/78  10/03/20 130/72  09/30/20 112/80    Physical Exam Vitals and nursing note reviewed.  HENT:     Head: Normocephalic.     Right Ear: External ear normal.     Left Ear: External ear normal.     Nose: Nose normal.  Eyes:     General: No scleral icterus.       Right eye: No discharge.        Left eye: No discharge.     Conjunctiva/sclera: Conjunctivae normal.     Pupils: Pupils are equal, round, and reactive to light.  Neck:     Thyroid: No thyromegaly.     Vascular: No JVD.     Trachea: No tracheal deviation.  Cardiovascular:     Rate and Rhythm: Normal rate and regular rhythm.     Heart sounds: Normal heart sounds. No murmur heard.   No friction rub. No gallop.  Pulmonary:     Effort: No respiratory distress.     Breath sounds: Normal breath sounds. No wheezing or rales.  Abdominal:     General: Bowel sounds are normal.     Palpations: Abdomen is soft. There is no mass.     Tenderness: There is no abdominal tenderness. There is no guarding or rebound.  Musculoskeletal:        General: No tenderness. Normal range of motion.     Cervical  back: Normal range of motion and neck supple.  Lymphadenopathy:     Cervical: No cervical adenopathy.  Skin:    General: Skin is warm.     Findings: No rash.     Comments: 1.5 cm diameter/ 0.5 deep nonhealing  Neurological:     Mental Status: He is alert and oriented to person, place, and time.  Cranial Nerves: No cranial nerve deficit.     Deep Tendon Reflexes: Reflexes are normal and symmetric.    Wt Readings from Last 3 Encounters:  10/24/20 181 lb (82.1 kg)  10/03/20 183 lb (83 kg)  09/30/20 184 lb (83.5 kg)    BP 120/78   Pulse 72   Ht '6\' 1"'$  (1.854 m)   Wt 181 lb (82.1 kg)   BMI 23.88 kg/m   Assessment and Plan:  1. Venous stasis ulcer of right calf limited to breakdown of skin with varicose veins ([place]) New onset.  Persistent.  Relatively stable is nonhealing.  Patient had an eschar that he removed this morning.  This is a relatively superficial but nonhealing ulcer presumably of a venous stasis nature.  Patient also has diabetes which is contributing to the nonhealing aspect of this wound.  We will refer to wound care center for advice we will cover with Augmentin in the event that there was some impetigo involved prior to removal of the eschar.  We will do wet-to-dry dressings in the meantime and patient has been instructed on once a day changing of dressing with demonstration from nurse. - AMB referral to wound care center - amoxicillin-clavulanate (AUGMENTIN) 875-125 MG tablet; Take 1 tablet by mouth 2 (two) times daily.  Dispense: 20 tablet; Refill: 0  2. Type 2 diabetes mellitus without complication, without long-term current use of insulin (HCC) Chronic.  Stable.  Last A1c at 6.3.  And is relatively controlled on current measures.

## 2020-10-25 NOTE — Patient Instructions (Signed)
Refer to memory book for information, turn off any distractions when engaging in tasks

## 2020-10-25 NOTE — Therapy (Signed)
[place] MAIN [name] SERVICES [address] [place], [place], [address] Phone: [phone]   Fax:  [phone]  Speech Language Pathology Treatment  Patient Details  Name: Andrew [name] MRN: [id number] Date of Birth: Aug 27, 1950 Referring Provider (SLP): Jennings Books   Encounter Date: 10/23/2020   End of Session - 10/25/20 0832     Visit Number 5    Number of Visits 25    Date for SLP Re-Evaluation 01/02/21    Authorization Type [employer]    Authorization Time Period 10/08/2020 thru 01/12/2021    Authorization - Visit Number 5    Progress Note Due on Visit 10    SLP Start Time 0900    SLP Stop Time  1000    SLP Time Calculation (min) 60 min    Activity Tolerance Patient tolerated treatment well             Past Medical History:  Diagnosis Date   Depression    Diabetes mellitus without complication ([place])    Hyperlipidemia    Hypertension     Past Surgical History:  Procedure Laterality Date   COLONOSCOPY WITH PROPOFOL N/A 07/13/2019   Procedure: COLONOSCOPY WITH PROPOFOL;  Surgeon: [name], MD;  Location: [facility] ENDOSCOPY;  Service: Endoscopy;  Laterality: N/A;   NASAL SINUS SURGERY     REPAIR OF ACUTE ASCENDING THORACIC AORTIC DISSECTION  12/2018    There were no vitals filed for this visit.   Subjective Assessment - 10/25/20 0831     Subjective "I was distracted by [place]ark week"    Patient is accompained by: Family member    Currently in Pain? No/denies                   ADULT SLP TREATMENT - 10/25/20 0001       Treatment Provided   Treatment provided Cognitive-Linquistic      Cognitive-Linquistic Treatment   Treatment focused on Cognition;Patient/family/caregiver education    Skilled Treatment COGNITION: MEMORY - Maximal cues to refer to external memory aid when playing known semi-complex game, when prompted to use memory aid pt's accuracy rises from 70% to 100%              SLP  Education - 10/25/20 0832     Education Details external memory aids, decreasing distractions when completing tasks    Person(s) Educated Patient;Spouse    Methods Explanation;Handout    Comprehension Verbalized understanding;Need further instruction              SLP Short Term Goals - 10/10/20 0637       SLP SHORT TERM GOAL #1   Title With minimal cues, pt will complete semi-complex reasoning task with 90% accuracy.    Baseline moderate cues    Time 10    Period --   sessions   Status New      SLP SHORT TERM GOAL #2   Title With moderate assistance, pt will complete tasks focusing on inferential reasoning with 80% accuracy.    Baseline new goal    Time 10    Period --   sessions   Status New      SLP SHORT TERM GOAL #3   Title Pt will demonstrate selective attention for 45 minutes in a moderately distracting environment.    Baseline 30 minutes basic selective attention    Time 10    Period --   session   Status New  SLP SHORT TERM GOAL #4   Title With moderate assistance, pt will demonstrate anticipatory awareness by providing appropriate solution to hypothetical safety situation.    Baseline new goal    Time 10    Period --   sessions   Status New              SLP Long Term Goals - 10/10/20 0641       SLP LONG TERM GOAL #1   Title Pt will complete complex reasoning tasks with > 90% ability.    Baseline new goal    Time 12    Period Months    Status New    Target Date 01/02/21      SLP LONG TERM GOAL #2   Title Pt will demonstrate divided attention to task for 30 minute intervals.    Baseline new goal    Time 12    Period Weeks    Status New    Target Date 01/02/21      SLP LONG TERM GOAL #3   Title Pt and wife will identify cognitive communication barriers and create compensatory strategies to increase pt's functional independence within family, leisure and community activities.    Baseline new goal    Time 12    Period Weeks    Status  New    Target Date 01/02/21              Plan - 10/25/20 [id number]     Clinical Impression Statement Pt presents with mild cognitive impairments in the areas of memory, emergent awareness, semi-complex problem solving, insight. While pt has been eager to use external memory aids, he didn't refer to memory notebook at home and thus was not able to complete any homework. When playing [employer] at home, he continued requiring maximal cues d/t lack of external memory aid. Education provided on need to develop habit of referring to memory aids to increase accurate recall and completion of tasks.   Skilled ST services continue to be required to target pt's cognitive deficits in an effort to increase pt's functional independence for return to leisure and community activities.    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, sequela [facility])    Problem List Patient Active Problem List   Diagnosis Date Noted   Aortic atherosclerosis ([place]) 05/02/2020   Screening for colon cancer    Aortic dissection proximal to innominate ([place]) 12/23/2018   Mixed hyperlipidemia 10/05/2016   Familial multiple lipoprotein-type hyperlipidemia 08/02/2014   Routine general medical examination at a health care facility 08/02/2014   Recurrent major depressive episodes ([place]) 08/02/2014   Essential (primary) hypertension  08/02/2014   Overweight 08/02/2014   Diabetes mellitus, type 2 ([place]) 08/02/2014   Alyra Patty B. Rutherford Nail M.S., CCC-SLP, [place] Pathologist Rehabilitation Services Office [phone]  Stormy Fabian 10/25/2020, 8:33 AM  [place] MAIN [facility] SERVICES [address]  [place], [place], [address] Phone: [phone]   Fax:  [phone]   Name: KACIE KRISTIANSEN MRN: [id number] Date of Birth: April 28, 1950

## 2020-10-27 ENCOUNTER — Encounter: Payer: PPO | Admitting: Speech Pathology

## 2020-10-28 ENCOUNTER — Other Ambulatory Visit: Payer: Self-pay

## 2020-10-28 ENCOUNTER — Ambulatory Visit: Payer: PPO | Attending: Neurology | Admitting: Speech Pathology

## 2020-10-28 ENCOUNTER — Encounter: Payer: PPO | Admitting: Speech Pathology

## 2020-10-28 DIAGNOSIS — G3184 Mild cognitive impairment, so stated: Secondary | ICD-10-CM | POA: Insufficient documentation

## 2020-10-28 DIAGNOSIS — R41841 Cognitive communication deficit: Secondary | ICD-10-CM | POA: Insufficient documentation

## 2020-10-28 DIAGNOSIS — IMO0001 Reserved for inherently not codable concepts without codable children: Secondary | ICD-10-CM

## 2020-10-28 DIAGNOSIS — S069X9S Unspecified intracranial injury with loss of consciousness of unspecified duration, sequela: Secondary | ICD-10-CM | POA: Diagnosis not present

## 2020-10-29 NOTE — Therapy (Signed)
[place] MAIN [facility] SERVICES [address] [place], [place], [address] Phone: [phone]   Fax:  [phone]  Speech Language Pathology Treatment  Patient Details  Name: Andrew [name] MRN: [id number] Date of Birth: [birth year] Referring Provider (SLP): Jennings Books   Encounter Date: 10/28/2020   End of Session - 10/29/20 0643     Visit Number 6    Number of Visits 25    Date for SLP Re-Evaluation 01/02/21    Authorization Type [employer]    Authorization Time Period 10/08/2020 thru 01/12/2021    Authorization - Visit Number 6    Progress Note Due on Visit 10    SLP Start Time 1400    SLP Stop Time  1500    SLP Time Calculation (min) 60 min    Activity Tolerance Patient tolerated treatment well             Past Medical History:  Diagnosis Date   Depression    Diabetes mellitus without complication ([place])    Hyperlipidemia    Hypertension     Past Surgical History:  Procedure Laterality Date   COLONOSCOPY WITH PROPOFOL N/A 07/13/2019   Procedure: COLONOSCOPY WITH PROPOFOL;  Surgeon: [name], MD;  Location: [facility] ENDOSCOPY;  Service: Endoscopy;  Laterality: N/A;   NASAL SINUS SURGERY     REPAIR OF ACUTE ASCENDING THORACIC AORTIC DISSECTION  12/2018    There were no vitals filed for this visit.   Subjective Assessment - 10/29/20 [id number]     Subjective "except for the needle, my dentist appt went well"    Currently in Pain? No/denies                   ADULT SLP TREATMENT - 10/29/20 0001       Treatment Provided   Treatment provided Cognitive-Linquistic      Cognitive-Linquistic Treatment   Treatment focused on Cognition;Patient/family/caregiver education    Skilled Treatment COGNITION: MEMORY - Maximal cues to locate albums on phone, unable to locate previous pictures of household items to use as an external memory aid; maimal cues to write information down in note book as well as maximal assistance  when following written information for accurately adding pictures to albums              SLP Education - 10/29/20 0643     Education Details external memory aids    Person(s) Educated Patient    Methods Explanation;Demonstration;Verbal cues    Comprehension Verbalized understanding;Need further instruction              SLP Short Term Goals - 10/10/20 0637       SLP SHORT TERM GOAL #1   Title With minimal cues, pt will complete semi-complex reasoning task with 90% accuracy.    Baseline moderate cues    Time 10    Period --   sessions   Status New      SLP SHORT TERM GOAL #2   Title With moderate assistance, pt will complete tasks focusing on inferential reasoning with 80% accuracy.    Baseline new goal    Time 10    Period --   sessions   Status New      SLP SHORT TERM GOAL #3   Title Pt will demonstrate selective attention for 45 minutes in a moderately distracting environment.    Baseline 30 minutes basic selective attention    Time 10    Period --  session   Status New      SLP SHORT TERM GOAL #4   Title With moderate assistance, pt will demonstrate anticipatory awareness by providing appropriate solution to hypothetical safety situation.    Baseline new goal    Time 10    Period --   sessions   Status New              SLP Long Term Goals - 10/10/20 0641       SLP LONG TERM GOAL #1   Title Pt will complete complex reasoning tasks with > 90% ability.    Baseline new goal    Time 12    Period Months    Status New    Target Date 01/02/21      SLP LONG TERM GOAL #2   Title Pt will demonstrate divided attention to task for 30 minute intervals.    Baseline new goal    Time 12    Period Weeks    Status New    Target Date 01/02/21      SLP LONG TERM GOAL #3   Title Pt and wife will identify cognitive communication barriers and create compensatory strategies to increase pt's functional independence within family, leisure and community  activities.    Baseline new goal    Time 12    Period Weeks    Status New    Target Date 01/02/21              Plan - 10/29/20 0644     Clinical Impression Statement Pt presents with mild cognitive impairments in the areas of memory, emergent awareness, semi-complex problem solving, insight. While pt has been eager to use external memory aids, he continues to require examples and heavy cues of what information needs to be written down, how much detail to include in written information and heavy cues to refer to written information. Education provided on need to develop habit of referring to memory aids to increase accurate recall and completion of tasks. Skilled ST services continue to be required to target pt's cognitive deficits in an effort to increase pt's functional independence for return to leisure and community activities.    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, sequela [facility])    Problem List Patient Active Problem List   Diagnosis Date Noted   Aortic atherosclerosis ([place]) 05/02/2020   Screening for colon cancer    Aortic dissection proximal to innominate ([place]) 12/23/2018   Mixed hyperlipidemia 10/05/2016   Familial multiple lipoprotein-type hyperlipidemia 08/02/2014   Routine general medical examination at a health care facility 08/02/2014   Recurrent major depressive episodes ([place]) 08/02/2014   Essential  (primary) hypertension 08/02/2014   Overweight 08/02/2014   Diabetes mellitus, type 2 ([place]) 08/02/2014   Sherwin Hollingshed B. Rutherford Nail M.S., CCC-SLP, [place] Pathologist Rehabilitation Services Office [phone]  Stormy Fabian 10/29/2020, 6:46 AM  [place] MAIN [facility] SERVICES [address] [place], [place], [address]  Phone: [phone]   Fax:  [phone]   Name: Andrew [name] MRN: [id number] Date of Birth: July 18, 1950

## 2020-10-29 NOTE — Patient Instructions (Signed)
Follow written directions to add pictures to albums on phone

## 2020-10-30 ENCOUNTER — Other Ambulatory Visit: Payer: Self-pay

## 2020-10-30 ENCOUNTER — Ambulatory Visit: Payer: PPO | Admitting: Speech Pathology

## 2020-10-30 DIAGNOSIS — IMO0001 Reserved for inherently not codable concepts without codable children: Secondary | ICD-10-CM

## 2020-10-30 DIAGNOSIS — R41841 Cognitive communication deficit: Secondary | ICD-10-CM

## 2020-10-30 DIAGNOSIS — G3184 Mild cognitive impairment, so stated: Secondary | ICD-10-CM

## 2020-10-31 NOTE — Therapy (Signed)
[place] MAIN [facility] SERVICES [address] [place], [place], [address] Phone: [phone]   Fax:  [phone]  Speech Language Pathology Treatment  Patient Details  Name: Andrew [name] MRN: [id number] Date of Birth: [birth year] Referring Provider (SLP): Jennings Books   Encounter Date: 10/30/2020   End of Session - 10/31/20 1456     Visit Number 7    Number of Visits 25    Date for SLP Re-Evaluation 01/02/21    Authorization Type [employer]    Authorization Time Period 10/08/2020 thru 01/12/2021    Authorization - Visit Number 7    Progress Note Due on Visit 10    SLP Start Time 0900    SLP Stop Time  1000    SLP Time Calculation (min) 60 min    Activity Tolerance Patient tolerated treatment well             Past Medical History:  Diagnosis Date   Depression    Diabetes mellitus without complication ([place])    Hyperlipidemia    Hypertension     Past Surgical History:  Procedure Laterality Date   COLONOSCOPY WITH PROPOFOL N/A 07/13/2019   Procedure: COLONOSCOPY WITH PROPOFOL;  Surgeon: [name], MD;  Location: [facility] ENDOSCOPY;  Service: Endoscopy;  Laterality: N/A;   NASAL SINUS SURGERY     REPAIR OF ACUTE ASCENDING THORACIC AORTIC DISSECTION  12/2018    There were no vitals filed for this visit.   Subjective Assessment - 10/31/20 1454     Subjective "my wife's flight got cancelled"    Currently in Pain? No/denies                   ADULT SLP TREATMENT - 10/31/20 0001       Treatment Provided   Treatment provided Cognitive-Linquistic      Cognitive-Linquistic Treatment   Treatment focused on Cognition;Patient/family/caregiver education    Skilled Treatment COGNITION: MEMORY - After initial Maximal cues for sequencing directions, referring back to correct section in directions for recall, pt able to perform with minimal assistance during a complex problem solving task              SLP  Education - 10/31/20 1456     Education Details external memory aid    Person(s) Educated Patient    Methods Explanation;Demonstration;Verbal cues    Comprehension Verbalized understanding;Returned demonstration;Need further instruction              SLP Short Term Goals - 10/10/20 [id number]       SLP SHORT TERM GOAL #1   Title With minimal cues, pt will complete semi-complex reasoning task with 90% accuracy.    Baseline moderate cues    Time 10    Period --   sessions   Status New      SLP SHORT TERM GOAL #2   Title With moderate assistance, pt will complete tasks focusing on inferential reasoning with 80% accuracy.    Baseline new goal    Time 10    Period --   sessions   Status New      SLP SHORT TERM GOAL #3   Title Pt will demonstrate selective attention for 45 minutes in a moderately distracting environment.    Baseline 30 minutes basic selective attention    Time 10    Period --   session   Status New      SLP SHORT TERM GOAL #4   Title With moderate  assistance, pt will demonstrate anticipatory awareness by providing appropriate solution to hypothetical safety situation.    Baseline new goal    Time 10    Period --   sessions   Status New              SLP Long Term Goals - 10/10/20 0641       SLP LONG TERM GOAL #1   Title Pt will complete complex reasoning tasks with > 90% ability.    Baseline new goal    Time 12    Period Months    Status New    Target Date 01/02/21      SLP LONG TERM GOAL #2   Title Pt will demonstrate divided attention to task for 30 minute intervals.    Baseline new goal    Time 12    Period Weeks    Status New    Target Date 01/02/21      SLP LONG TERM GOAL #3   Title Pt and wife will identify cognitive communication barriers and create compensatory strategies to increase pt's functional independence within family, leisure and community activities.    Baseline new goal    Time 12    Period Weeks    Status New    Target  Date 01/02/21              Plan - 10/31/20 1457     Clinical Impression Statement Pt presents with mild cognitive impairments in the areas of memory, emergent awareness, semi-complex problem solving, insight. Written instrucitons for complex problem solving task were provided to help pt refer to written information (use external memory aid). Pt progressed from maximal cues to minimal cues and his accuracy with problem solving also increased from maximal support to 75% with minimal support. Skilled ST services continue to be required to target pt's cognitive deficits in an effort to increase pt's functional independence for return to leisure and community activities.    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, sequela [facility])    Problem List Patient Active Problem List   Diagnosis Date Noted   Aortic atherosclerosis ([place]) 05/02/2020   Screening for colon cancer    Aortic dissection proximal to innominate ([place]) 12/23/2018   Mixed hyperlipidemia 10/05/2016   Familial multiple lipoprotein-type hyperlipidemia 08/02/2014   Routine general medical examination at a health care facility 08/02/2014   Recurrent major depressive episodes ([place]) 08/02/2014   Essential (primary) hypertension 08/02/2014   Overweight 08/02/2014   Diabetes mellitus, type 2 ([place]) 08/02/2014   Jedd Schulenburg B. Rutherford Nail M.S., CCC-SLP, [place]  Pathologist Rehabilitation Services Office [phone]  Stormy Fabian 10/31/2020, 3:10 PM  [place] MAIN [facility] SERVICES [address] [place], [place], [address] Phone: [phone]   Fax:  [phone]   Name: Andrew [name] MRN: [id number] Date of Birth: Mar 02, 1951

## 2020-11-03 ENCOUNTER — Encounter: Payer: PPO | Admitting: Speech Pathology

## 2020-11-04 ENCOUNTER — Other Ambulatory Visit: Payer: Self-pay

## 2020-11-04 ENCOUNTER — Encounter: Payer: PPO | Admitting: Speech Pathology

## 2020-11-04 ENCOUNTER — Ambulatory Visit: Payer: PPO | Admitting: Speech Pathology

## 2020-11-04 DIAGNOSIS — R41841 Cognitive communication deficit: Secondary | ICD-10-CM

## 2020-11-04 DIAGNOSIS — G3184 Mild cognitive impairment, so stated: Secondary | ICD-10-CM

## 2020-11-04 DIAGNOSIS — IMO0001 Reserved for inherently not codable concepts without codable children: Secondary | ICD-10-CM

## 2020-11-04 NOTE — Therapy (Signed)
[place] MAIN [facility] SERVICES [address] [place], [place], [address] Phone: [phone]   Fax:  [phone]  Speech Language Pathology Treatment  Patient Details  Name: QUADARRIUS DOBIES MRN: [id number] Date of Birth: 02-27-1951 Referring Provider (SLP): Jennings Books   Encounter Date: 11/04/2020   End of Session - 11/04/20 1354     Visit Number 8    Number of Visits 25    Date for SLP Re-Evaluation 01/02/21    Authorization Type [employer]    Authorization Time Period 10/08/2020 thru 01/12/2021    Authorization - Visit Number 8    Progress Note Due on Visit 10    SLP Start Time 0900    SLP Stop Time  1000    SLP Time Calculation (min) 60 min    Activity Tolerance Patient tolerated treatment well             Past Medical History:  Diagnosis Date   Depression    Diabetes mellitus without complication ([place])    Hyperlipidemia    Hypertension     Past Surgical History:  Procedure Laterality Date   COLONOSCOPY WITH PROPOFOL N/A 07/13/2019   Procedure: COLONOSCOPY WITH PROPOFOL;  Surgeon: [name], MD;  Location: [facility] ENDOSCOPY;  Service: Endoscopy;  Laterality: N/A;   NASAL SINUS SURGERY     REPAIR OF ACUTE ASCENDING THORACIC AORTIC DISSECTION  12/2018    There were no vitals filed for this visit.   Subjective Assessment - 11/04/20 1353     Subjective pt pleasant, reports that his wife is enjoying herself in [place]    Currently in Pain? No/denies                   ADULT SLP TREATMENT - 11/04/20 0001       Treatment Provided   Treatment provided Cognitive-Linquistic      Cognitive-Linquistic Treatment   Treatment focused on Cognition;Patient/family/caregiver education    Skilled Treatment COGNITION: MEMORY - 858% independent use of external memory aid to complete complex problem solving task              SLP Education - 11/04/20 1354     Education Details provided    [employer]) Educated  Patient    Methods Explanation;Demonstration;Verbal cues    Comprehension Verbalized understanding;Returned demonstration;Need further instruction              SLP Short Term Goals - 10/10/20 [id number]       SLP SHORT TERM GOAL #1   Title With minimal cues, pt will complete semi-complex reasoning task with 90% accuracy.    Baseline moderate cues    Time 10    Period --   sessions   Status New      SLP SHORT TERM GOAL #2   Title With moderate assistance, pt will complete tasks focusing on inferential reasoning with 80% accuracy.    Baseline new goal    Time 10    Period --   sessions   Status New      SLP SHORT TERM GOAL #3   Title Pt will demonstrate selective attention for 45 minutes in a moderately distracting environment.    Baseline 30 minutes basic selective attention    Time 10    Period --   session   Status New      SLP SHORT TERM GOAL #4   Title With moderate assistance, pt will demonstrate anticipatory awareness by providing appropriate solution to hypothetical  safety situation.    Baseline new goal    Time 10    Period --   sessions   Status New              SLP Long Term Goals - 10/10/20 0641       SLP LONG TERM GOAL #1   Title Pt will complete complex reasoning tasks with > 90% ability.    Baseline new goal    Time 12    Period Months    Status New    Target Date 01/02/21      SLP LONG TERM GOAL #2   Title Pt will demonstrate divided attention to task for 30 minute intervals.    Baseline new goal    Time 12    Period Weeks    Status New    Target Date 01/02/21      SLP LONG TERM GOAL #3   Title Pt and wife will identify cognitive communication barriers and create compensatory strategies to increase pt's functional independence within family, leisure and community activities.    Baseline new goal    Time 12    Period Weeks    Status New    Target Date 01/02/21              Plan - 11/04/20 1355     Clinical Impression Statement  Pt presents with mild cognitive impairments in the areas of memory, emergent awareness, semi-complex problem solving, insight. Pt states that he showed a friend how to create albums and store pictures according to albums. He also brings all items to therapy. During today's task, pt demonstrated increased independent use of external memory aids improving from 75% to 85% during this session. Subjectively, he required moderate faded to minimal cues for emergent awareness, specifically for self-correcting. Skilled ST services continue to be required to target pt's cognitive deficits in an effort to increase pt's functional independence for return to leisure and community activities.    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, sequela [facility])    Problem List Patient Active Problem List   Diagnosis Date Noted   Aortic atherosclerosis ([place]) 05/02/2020   Screening for colon cancer    Aortic dissection proximal to innominate ([place]) 12/23/2018   Mixed hyperlipidemia 10/05/2016   Familial multiple lipoprotein-type hyperlipidemia 08/02/2014   Routine general medical examination at a health care facility 08/02/2014   Recurrent major depressive episodes ([place]) 08/02/2014   Essential (primary) hypertension 08/02/2014   Overweight 08/02/2014   Diabetes mellitus, type 2 ([place]) 08/02/2014   Louis Gaw B. Rutherford Nail M.S., CCC-SLP, [place] Pathologist Rehabilitation Services Office [phone]  Stormy Fabian 11/04/2020, 1:56 PM  [place] MAIN [facility] SERVICES [address] [place], [place], [address] Phone: [phone]   Fax:  [phone]   Name: ZACHARIAN LIGUORI MRN: [id number] Date of Birth: [birth year]

## 2020-11-06 ENCOUNTER — Ambulatory Visit: Payer: PPO | Admitting: Speech Pathology

## 2020-11-06 ENCOUNTER — Other Ambulatory Visit: Payer: Self-pay

## 2020-11-06 DIAGNOSIS — R41841 Cognitive communication deficit: Secondary | ICD-10-CM

## 2020-11-06 DIAGNOSIS — G3184 Mild cognitive impairment, so stated: Secondary | ICD-10-CM

## 2020-11-06 DIAGNOSIS — IMO0001 Reserved for inherently not codable concepts without codable children: Secondary | ICD-10-CM

## 2020-11-07 ENCOUNTER — Encounter: Payer: PPO | Attending: Physician Assistant | Admitting: Physician Assistant

## 2020-11-07 DIAGNOSIS — S81811A Laceration without foreign body, right lower leg, initial encounter: Secondary | ICD-10-CM | POA: Insufficient documentation

## 2020-11-07 DIAGNOSIS — E11622 Type 2 diabetes mellitus with other skin ulcer: Secondary | ICD-10-CM | POA: Diagnosis not present

## 2020-11-07 DIAGNOSIS — I10 Essential (primary) hypertension: Secondary | ICD-10-CM | POA: Diagnosis not present

## 2020-11-07 DIAGNOSIS — L97812 Non-pressure chronic ulcer of other part of right lower leg with fat layer exposed: Secondary | ICD-10-CM | POA: Diagnosis not present

## 2020-11-07 DIAGNOSIS — W2209XA Striking against other stationary object, initial encounter: Secondary | ICD-10-CM | POA: Insufficient documentation

## 2020-11-07 DIAGNOSIS — L97919 Non-pressure chronic ulcer of unspecified part of right lower leg with unspecified severity: Secondary | ICD-10-CM | POA: Diagnosis present

## 2020-11-07 NOTE — Progress Notes (Signed)
Andrew [name] ([id number]) Visit Report for 11/07/2020 Allergy List Details Patient Name: Andrew [name], [name] Date of Service: 11/07/2020 9:45 AM Medical Record Number: [id number] Patient Account Number: [id number] Date of Birth/Sex: 1951-02-15 (70 y.o. M) Treating RN: Andrew [name] Primary Care Andrew [name]: Andrew [name] Other Clinician: Referring Skylynn [name]: Andrew [name] Treating Andrew [name]/Extender: Andrew [name] Weeks in Treatment: 0 Allergies Active Allergies No Known Allergies Allergy Notes Electronic Signature(s) Signed: 11/07/2020 11:30:33 AM By: Andrew [name] Entered By: Andrew [name] on 11/07/2020 10:29:51 Andrew [name] ([id number]) -------------------------------------------------------------------------------- Arrival Information Details Patient Name: Andrew [name], [name] Date of Service: 11/07/2020 9:45 AM Medical Record Number: [id number] Patient Account Number: [id number] Date of Birth/Sex: 19-May-1950 (70 y.o. M) Treating RN: Andrew [name] Primary Care Andrew [name]: Andrew [name] Other Clinician: Referring Andrew [name]: Andrew [name] Treating Andrew [name]/Extender: Andrew [name] in Treatment: 0 Visit Information Patient Arrived: Ambulatory Arrival Time: 10:21 Accompanied By: self Transfer Assistance: None Patient Identification Verified: Yes Secondary Verification Process Completed: Yes Patient Has Alerts: Yes Patient Alerts: Patient on Blood Thinner Aspirin '81mg'$  Diabetic Electronic Signature(s) Signed: 11/07/2020 11:30:33 AM By: Andrew [name] Entered By: Andrew [name] on 11/07/2020 10:28:53 Andrew [name] ([id number]) -------------------------------------------------------------------------------- Clinic Level of Care Assessment Details Patient Name: Andrew [name], [name] Date of Service: 11/07/2020 9:45 AM Medical Record Number: [id number] Patient Account Number: [id number] Date of Birth/Sex: 12-23-1950 (70 y.o. M) Treating RN: Andrew [name] Primary Care Andrew [name]: Andrew [name] Other  Clinician: Referring Andrew [name]: Andrew [name] Treating Andrew [name]/Extender: Andrew [name] in Treatment: 0 Clinic Level of Care Assessment Items TOOL 1 Quantity Score '[]'$  - Use when EandM and Procedure is performed on INITIAL visit 0 ASSESSMENTS - Nursing Assessment / Reassessment X - General Physical Exam (combine w/ comprehensive assessment (listed just below) when performed on new 1 20 pt. evals) '[]'$  - 0 Comprehensive Assessment (HX, ROS, Risk Assessments, Wounds Hx, etc.) ASSESSMENTS - Wound and Skin Assessment / Reassessment '[]'$  - Dermatologic / Skin Assessment (not related to wound area) 0 ASSESSMENTS - Ostomy and/or Continence Assessment and Care '[]'$  - Incontinence Assessment and Management 0 '[]'$  - 0 Ostomy Care Assessment and Management (repouching, etc.) PROCESS - Coordination of Care X - Simple Patient / Family Education for ongoing care 1 15 '[]'$  - 0 Complex (extensive) Patient / Family Education for ongoing care X- 1 10 Staff obtains Programmer, systems, Records, Test Results / Process Orders '[]'$  - 0 Staff telephones HHA, Nursing Homes / Clarify orders / etc '[]'$  - 0 Routine Transfer to another Facility (non-emergent condition) '[]'$  - 0 Routine Hospital Admission (non-emergent condition) '[]'$  - 0 New Admissions / Biomedical engineer / Ordering NPWT, Apligraf, etc. '[]'$  - 0 Emergency Hospital Admission (emergent condition) PROCESS - Special Needs '[]'$  - Pediatric / Minor Patient Management 0 '[]'$  - 0 Isolation Patient Management '[]'$  - 0 Hearing / Language / Visual special needs '[]'$  - 0 Assessment of Community assistance (transportation, D/C planning, etc.) '[]'$  - 0 Additional assistance / Altered mentation '[]'$  - 0 Support Surface(s) Assessment (bed, cushion, seat, etc.) INTERVENTIONS - Miscellaneous '[]'$  - External ear exam 0 '[]'$  - 0 Patient Transfer (multiple staff / Civil Service fast streamer / Similar devices) '[]'$  - 0 Simple Staple / Suture removal (25 or less) '[]'$  - 0 Complex Staple / Suture removal  (26 or more) '[]'$  - 0 Hypo/Hyperglycemic Management (do not check if billed separately) X- 1 15 Ankle / Brachial Index (ABI) - do not check if billed separately Has the patient been seen at the hospital within the last three years: Yes Total Score: 60 Level Of  Care: New/Established - Level 2 Andrew [name], [name] ([id number]) Electronic Signature(s) Signed: 11/07/2020 11:30:33 AM By: Andrew [name] Entered By: Andrew [name] on 11/07/2020 11:15:54 Andrew [name], [name] ([id number]) -------------------------------------------------------------------------------- Encounter Discharge Information Details Patient Name: Andrew [name], [name] Date of Service: 11/07/2020 9:45 AM Medical Record Number: [id number] Patient Account Number: [id number] Date of Birth/Sex: [birth year] (70 y.o. M) Treating RN: Andrew [name] Primary Care Andrew [name]: Andrew [name] Other Clinician: Referring Andrew [name]: Andrew [name] Treating Aadith Raudenbush/Extender: Andrew [name] in Treatment: 0 Encounter Discharge Information Items Post Procedure Vitals Discharge Condition: Stable Temperature (F): 98.8 Ambulatory Status: Ambulatory Pulse (bpm): 65 Discharge Destination: Home Respiratory Rate (breaths/min): 16 Transportation: Private Auto Blood Pressure (mmHg): 141/88 Accompanied By: self Schedule Follow-up Appointment: Yes Clinical Summary of Care: Electronic Signature(s) Signed: 11/07/2020 11:30:33 AM By: Andrew [name] Entered By: Andrew [name] on 11/07/2020 11:16:52 Andrew [name] ([id number]) -------------------------------------------------------------------------------- Lower Extremity Assessment Details Patient Name: Andrew [name], [name] Date of Service: 11/07/2020 9:45 AM Medical Record Number: [id number] Patient Account Number: [id number] Date of Birth/Sex: 1950-08-02 (70 y.o. M) Treating RN: Andrew [name] Primary Care Kweli Grassel: Andrew [name] Other Clinician: Referring Tarence Searcy: Andrew [name] Treating Eshika Reckart/Extender: Andrew [name] Weeks in Treatment: 0 Edema Assessment Assessed: [Left: No] [Right: Yes] [Left: Edema] [Right: :] Calf Left: Right: Point of Measurement: 39 cm From Medial Instep 32.4 cm Ankle Left: Right: Point of Measurement: 11 cm From Medial Instep 19.5 cm Knee To Floor Left: Right: From Medial Instep 47 cm Vascular Assessment Pulses: Dorsalis Pedis Palpable: [Right:Yes] Blood Pressure: Brachial: [Right:141] Ankle: [Right:Dorsalis Pedis: 160 1.13] Electronic Signature(s) Signed: 11/07/2020 11:30:33 AM By: Andrew [name] Entered By: Andrew [name] on 11/07/2020 10:42:12 [place], Boyd. ([id number]) -------------------------------------------------------------------------------- Multi Wound Chart Details Patient Name: Andrew [name], [name] Date of Service: 11/07/2020 9:45 AM Medical Record Number: [id number] Patient Account Number: [id number] Date of Birth/Sex: 06/10/1950 (70 y.o. M) Treating RN: Andrew [name] Primary Care Zhaire Locker: Andrew [name] Other Clinician: Referring Marvion Bastidas: Andrew [name] Treating Wilburn Keir/Extender: Andrew [name] Weeks in Treatment: 0 Vital Signs Height(in): 73 Pulse(bpm): 65 Weight(lbs): 181 Blood Pressure(mmHg): 141/88 Body Mass Index(BMI): 24 Temperature(F): 98.8 Respiratory Rate(breaths/min): 16 Photos: [N/A:N/A] Wound Location: Right, Lateral Lower Leg N/A N/A Wounding Event: Laceration N/A N/A Primary Etiology: Trauma, Other N/A N/A Comorbid History: Coronary Artery Disease, N/A N/A Hypertension, Type II Diabetes Date Acquired: 09/22/2020 N/A N/A Weeks of Treatment: 0 N/A N/A Wound Status: Open N/A N/A Measurements L x W x D (cm) 0.4x0.3x0.1 N/A N/A Area (cm) : 0.094 N/A N/A Volume (cm) : 0.009 N/A N/A Classification: Full Thickness Without Exposed N/A N/A Support Structures Exudate Amount: Medium N/A N/A Exudate Type: Serosanguineous N/A N/A Exudate Color: red, brown N/A N/A Granulation Amount: Medium (34-66%) N/A N/A Granulation Quality: Red,  Pink N/A N/A Necrotic Amount: Medium (34-66%) N/A N/A Necrotic Tissue: Eschar, Adherent Slough N/A N/A Treatment Notes Electronic Signature(s) Signed: 11/07/2020 11:30:33 AM By: Andrew [name] Entered By: Andrew [name] on 11/07/2020 11:08:00 Andrew [name], Andrew [name] ([id number]) -------------------------------------------------------------------------------- [place] Details Patient Name: Andrew [name], [name] Date of Service: 11/07/2020 9:45 AM Medical Record Number: [id number] Patient Account Number: [id number] Date of Birth/Sex: May 15, 1950 (70 y.o. M) Treating RN: Andrew [name] Primary Care Philo Kurtz: Andrew [name] Other Clinician: Referring Tylor Courtwright: Andrew [name] Treating Cyris Maalouf/Extender: Andrew [name] in Treatment: 0 Active Inactive Orientation to the Wound Care Program Nursing Diagnoses: Knowledge deficit related to the wound healing center program Goals: Patient/caregiver will verbalize understanding of the [place] Program Date Initiated: 11/07/2020 Target Resolution Date: 11/26/2020 Goal Status: Active Interventions: Provide education on  orientation to the wound center Notes: Wound/Skin Impairment Nursing Diagnoses: Impaired tissue integrity Knowledge deficit related to smoking impact on wound healing Knowledge deficit related to ulceration/compromised skin integrity Goals: Patient/caregiver will verbalize understanding of skin care regimen Date Initiated: 11/07/2020 Target Resolution Date: 11/26/2020 Goal Status: Active Ulcer/skin breakdown will have a volume reduction of 30% by week 4 Date Initiated: 11/07/2020 Target Resolution Date: 12/08/2020 Goal Status: Active Ulcer/skin breakdown will have a volume reduction of 50% by week 8 Date Initiated: 11/07/2020 Target Resolution Date: 01/07/2021 Goal Status: Active Ulcer/skin breakdown will have a volume reduction of 80% by week 12 Date Initiated: 11/07/2020 Target Resolution Date: 02/07/2021 Goal  Status: Active Interventions: Assess patient/caregiver ability to obtain necessary supplies Assess patient/caregiver ability to perform ulcer/skin care regimen upon admission and as needed Assess ulceration(s) every visit Notes: Electronic Signature(s) Signed: 11/07/2020 11:30:33 AM By: Andrew [name] Entered By: Andrew [name] on 11/07/2020 11:07:50 [place], [place] ([id number]) -------------------------------------------------------------------------------- Pain Assessment Details Patient Name: Andrew [name], [name] Date of Service: 11/07/2020 9:45 AM Medical Record Number: [id number] Patient Account Number: [id number] Date of Birth/Sex: 1950/09/05 (70 y.o. M) Treating RN: Andrew [name] Primary Care Sachiko Methot: Andrew [name] Other Clinician: Referring Rayen Palen: Andrew [name] Treating Kasem Mozer/Extender: Andrew [name] Weeks in Treatment: 0 Active Problems Location of Pain Severity and Description of Pain Patient Has Paino No Site Locations Rate the pain. Current Pain Level: 0 Pain Management and Medication Current Pain Management: Electronic Signature(s) Signed: 11/07/2020 11:30:33 AM By: Andrew [name] Entered By: Andrew [name] on 11/07/2020 10:29:01 Dobratz, Donye Andrew [name] ([id number]) -------------------------------------------------------------------------------- Patient/Caregiver Education Details Patient Name: Andrew [name], [name] Date of Service: 11/07/2020 9:45 AM Medical Record Number: [id number] Patient Account Number: [id number] Date of Birth/Gender: [birth year] (70 y.o. M) Treating RN: Andrew [name] Primary Care Physician: Andrew [name] Other Clinician: Referring Physician: Otilio [name] Treating Physician/Extender: Andrew [name] in Treatment: 0 Education Assessment Education Provided To: Patient Education Topics Provided Basic Hygiene: Welcome To [facility] [place]: Wound Debridement: Wound/Skin Impairment: Electronic Signature(s) Signed: 11/07/2020 11:30:33 AM By: Andrew [name] Entered By: Andrew [name] on 11/07/2020 11:08:22 Andrew [name], [place]. ([id number]) -------------------------------------------------------------------------------- Wound Assessment Details Patient Name: Andrew [name], [name] Date of Service: 11/07/2020 9:45 AM Medical Record Number: [id number] Patient Account Number: [id number] Date of Birth/Sex: [birth year] (70 y.o. M) Treating RN: Andrew [name] Primary Care Nash Bolls: Andrew [name] Other Clinician: Referring Renan Danese: Andrew [name] Treating Caedyn Tassinari/Extender: Andrew [name] Weeks in Treatment: 0 Wound Status Wound Number: 1 Primary Etiology: Trauma, Other Wound Location: Right, Lateral Lower Leg Wound Status: Open Wounding Event: Laceration Comorbid Coronary Artery Disease, Hypertension, Type II History: Diabetes Date Acquired: 09/22/2020 Weeks Of Treatment: 0 Clustered Wound: No Photos Wound Measurements Length: (cm) 0.4 Width: (cm) 0.3 Depth: (cm) 0.1 Area: (cm) 0.094 Volume: (cm) 0.009 % Reduction in Area: % Reduction in Volume: Tunneling: No Undermining: No Wound Description Classification: Full Thickness Without Exposed Support Structures Exudate Amount: Medium Exudate Type: Serosanguineous Exudate Color: red, brown Foul Odor After Cleansing: No Wound Bed Granulation Amount: Medium (34-66%) Granulation Quality: Red, Pink Necrotic Amount: Medium (34-66%) Necrotic Quality: Eschar, Adherent Slough Treatment Notes Wound #1 (Lower Leg) Wound Laterality: Right, Lateral Cleanser Soap and Water Discharge Instruction: Gently cleanse wound with antibacterial soap, rinse and pat dry prior to dressing wounds Peri-Wound Care Topical Primary Dressing Berkemeier, Campbell L. ([id number]) Prisma 4.34 (in) Discharge Instruction: Moisten w/normal saline or sterile water; Cover wound as directed. Do not remove from wound bed. Secondary Dressing Mepilex Border Flex, 4x4 (in/in) Discharge Instruction: Apply to wound as directed. Do  not  cut. Secured With Compression Wrap Compression Stockings Add-Ons Electronic Signature(s) Signed: 11/07/2020 11:30:33 AM By: Andrew [name] Entered By: Andrew [name] on 11/07/2020 10:37:24 [place], [place] Andrew [name] ([id number]) -------------------------------------------------------------------------------- Vitals Details Patient Name: Merfeld, Glade L. Date of Service: 11/07/2020 9:45 AM Medical Record Number: [id number] Patient Account Number: [id number] Date of Birth/Sex: Jun 07, 1950 (70 y.o. M) Treating RN: Andrew [name] Primary Care Tyarra Nolton: Andrew [name] Other Clinician: Referring Lionell Matuszak: Andrew [name] Treating Janki Dike/Extender: Andrew [name] Weeks in Treatment: 0 Vital Signs Time Taken: 10:28 Temperature (F): 98.8 Height (in): 73 Pulse (bpm): 65 Source: Stated Respiratory Rate (breaths/min): 16 Weight (lbs): 181 Blood Pressure (mmHg): 141/88 Source: Stated Reference Range: 80 - 120 mg / dl Body Mass Index (BMI): 23.9 Electronic Signature(s) Signed: 11/07/2020 11:30:33 AM By: Andrew [name] Entered ByDonnamarie [name] on 11/07/2020 10:29:37

## 2020-11-07 NOTE — Progress Notes (Signed)
STELIOS, MCGHIE ([id number]) Visit Report for 11/07/2020 Abuse/Suicide Risk Screen Details Patient Name: Andrew [name], [name] Date of Service: 11/07/2020 9:45 AM Medical Record Number: [id number] Patient Account Number: [id number] Date of Birth/Sex: [birth year] (70 y.o. M) Treating RN: Donnamarie Poag Primary Care Teresa Nicodemus: [name] Other Clinician: Referring Elleana Stillson: [name] Treating Jasan Doughtie/Extender: Jeri Cos Weeks in Treatment: 0 Abuse/Suicide Risk Screen Items Answer ABUSE RISK SCREEN: Has anyone close to you tried to hurt or harm you recentlyo No Do you feel uncomfortable with anyone in your familyo No Has anyone forced you do things that you didnot want to doo No Electronic Signature(s) Signed: 11/07/2020 11:30:33 AM By: Donnamarie Poag Entered By: Donnamarie Poag on 11/07/2020 10:32:38 Lipscomb, Lilly ([id number]) -------------------------------------------------------------------------------- Activities of Daily Living Details Patient Name: Andrew [name], [name] Date of Service: 11/07/2020 9:45 AM Medical Record Number: [id number] Patient Account Number: [id number] Date of Birth/Sex: 04-10-1950 (70 y.o. M) Treating RN: Donnamarie Poag Primary Care Jasaiah Karwowski: [name] Other Clinician: Referring Marg Macmaster: [name] Treating Loren Sawaya/Extender: Jeri Cos Weeks in Treatment: 0 Activities of Daily Living Items Answer Activities of Daily Living (Please select one for each item) Drive Automobile Completely Able Take Medications Completely Able Use Telephone Completely Able Care for Appearance Completely Able Use Toilet Completely Able Bath / Shower Completely Able Dress Self Completely Able Feed Self Completely Able Walk Completely Able Get In / Out Bed Completely Able Housework Completely Able Prepare Meals Completely [place] for Self Completely Able Electronic Signature(s) Signed: 11/07/2020 11:30:33 AM By: Donnamarie Poag Entered By:  Donnamarie Poag on 11/07/2020 10:30:19 Andrew [name], Andrew [name] ([id number]) -------------------------------------------------------------------------------- Education Screening Details Patient Name: Andrew [name], [name] Date of Service: 11/07/2020 9:45 AM Medical Record Number: [id number] Patient Account Number: [id number] Date of Birth/Sex: 1950-08-15 (70 y.o. M) Treating RN: Donnamarie Poag Primary Care Lemmie Vanlanen: [name] Other Clinician: Referring Muaz Shorey: [name] Treating Crescentia Boutwell/Extender: Skipper Cliche in Treatment: 0 Primary Learner Assessed: Patient Learning Preferences/Education Level/Primary Language Learning Preference: Explanation Highest Education Level: College or Above Preferred Language: English Cognitive Barrier Language Barrier: No Translator Needed: No Memory Deficit: No Emotional Barrier: No Cultural/Religious Beliefs Affecting Medical Care: No Physical Barrier Impaired Vision: No Impaired Hearing: No Decreased Hand dexterity: No Knowledge/Comprehension Knowledge Level: High Comprehension Level: High Ability to understand written instructions: High Ability to understand verbal instructions: High Motivation Anxiety Level: Calm Cooperation: Cooperative Education Importance: Acknowledges Need Interest in Health Problems: Asks Questions Perception: Coherent Willingness to Engage in Self-Management High Activities: Readiness to Engage in Self-Management High Activities: Electronic Signature(s) Signed: 11/07/2020 11:30:33 AM By: Donnamarie Poag Entered By: Donnamarie Poag on 11/07/2020 10:30:41 Andrew [name], Andrew [name] ([id number]) -------------------------------------------------------------------------------- Fall Risk Assessment Details Patient Name: Andrew [name], [name] Date of Service: 11/07/2020 9:45 AM Medical Record Number: [id number] Patient Account Number: [id number] Date of Birth/Sex: [birth year] (70 y.o. M) Treating RN: Donnamarie Poag Primary Care Delene Morais: [name] Other Clinician: Referring Tahlia Deamer: [name] Treating Dusan Lipford/Extender: Skipper Cliche in Treatment: 0 Fall Risk Assessment Items Have you had 2 or more falls in the last 12 monthso 0 No Have you had any fall that resulted in injury in the last 12 monthso 0 No FALLS RISK SCREEN History of falling - immediate or within 3 months 0 No Secondary diagnosis (Do you have 2 or more medical diagnoseso) 0 No Ambulatory aid None/bed rest/wheelchair/nurse 0 Yes Crutches/cane/walker 0 No Furniture 0 No Intravenous therapy Access/Saline/Heparin Lock 0 No Gait/Transferring Normal/ bed rest/ wheelchair 0 Yes Weak (short steps with or without shuffle,  stooped but able to lift head while walking, may 0 No seek support from furniture) Impaired (short steps with shuffle, may have difficulty arising from chair, head down, impaired 0 No balance) Mental Status Oriented to own ability 0 Yes Electronic Signature(s) Signed: 11/07/2020 11:30:33 AM By: Donnamarie Poag Entered By: Donnamarie Poag on 11/07/2020 10:31:10 Andrew [name], [name] ([id number]) -------------------------------------------------------------------------------- Foot Assessment Details Patient Name: Andrew [name], [name] Date of Service: 11/07/2020 9:45 AM Medical Record Number: [id number] Patient Account Number: [id number] Date of Birth/Sex: Apr 20, 1950 (70 y.o. M) Treating RN: Donnamarie Poag Primary Care Kennon Encinas: [name] Other Clinician: Referring Karleen Seebeck: [name] Treating Pete Merten/Extender: Jeri Cos Weeks in Treatment: 0 Foot Assessment Items Site Locations + = Sensation present, - = Sensation absent, C = Callus, U = Ulcer R = Redness, W = Warmth, M = Maceration, PU = Pre-ulcerative lesion F = Fissure, S = Swelling, D = Dryness Assessment Right: Left: Other Deformity: No No Prior Foot Ulcer: No No Prior Amputation: No No Charcot Joint: No No Ambulatory Status: Ambulatory Without Help Gait: Steady Electronic  Signature(s) Signed: 11/07/2020 11:30:33 AM By: Donnamarie Poag Entered By: Donnamarie Poag on 11/07/2020 10:32:21 Andrew [name], Andrew [name] ([id number]) -------------------------------------------------------------------------------- Nutrition Risk Screening Details Patient Name: Andrew [name], [name] Date of Service: 11/07/2020 9:45 AM Medical Record Number: [id number] Patient Account Number: [id number] Date of Birth/Sex: 05-07-1950 (70 y.o. M) Treating RN: Donnamarie Poag Primary Care Marypat Kimmet: [name] Other Clinician: Referring Romana Deaton: [name] Treating Devondre Guzzetta/Extender: Jeri Cos Weeks in Treatment: 0 Height (in): 73 Weight (lbs): 181 Body Mass Index (BMI): 23.9 Nutrition Risk Screening Items Score Screening NUTRITION RISK SCREEN: I have an illness or condition that made me change the kind and/or amount of food I eat 0 No I eat fewer than two meals per day 0 No I eat few fruits and vegetables, or milk products 0 No I have three or more drinks of beer, liquor or wine almost every day 0 No I have tooth or mouth problems that make it hard for me to eat 0 No I don't always have enough money to buy the food I need 0 No I eat alone most of the time 0 No I take three or more different prescribed or over-the-counter drugs a day 1 Yes Without wanting to, I have lost or gained 10 pounds in the last six months 0 No I am not always physically able to shop, cook and/or feed myself 0 No Nutrition Protocols Good Risk Protocol 0 No interventions needed Moderate Risk Protocol High Risk Proctocol Risk Level: Good Risk Score: 1 Electronic Signature(s) Signed: 11/07/2020 11:30:33 AM By: Donnamarie Poag Entered ByDonnamarie Poag on 11/07/2020 10:31:22
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
## 2020-11-07 NOTE — Progress Notes (Signed)
Andrew, [name] ([id number]) Visit Report for 11/07/2020 Chief Complaint Document Details Patient Name: Andrew [name], [name] Date of Service: 11/07/2020 9:45 AM Medical Record Number: [id number] Patient Account Number: [id number] Date of Birth/Sex: December 19, 1950 (70 y.o. M) Treating RN: Donnamarie Poag Primary Care Provider: [name] Other Clinician: Referring Provider: [name] Treating Provider/Extender: Jeri Cos Weeks in Treatment: 0 Information Obtained from: Patient Chief Complaint Right leg ulcer Electronic Signature(s) Signed: 11/07/2020 10:54:40 AM By: Worthy Keeler PA-C Entered By: Worthy Keeler on 11/07/2020 10:54:40 Andrew [name] ([id number]) -------------------------------------------------------------------------------- Debridement Details Patient Name: [name], [name] Date of Service: 11/07/2020 9:45 AM Medical Record Number: [id number] Patient Account Number: [id number] Date of Birth/Sex: [birth year] (70 y.o. M) Treating RN: Donnamarie Poag Primary Care Provider: [name] Other Clinician: Referring Provider: [name] Treating Provider/Extender: Jeri Cos Weeks in Treatment: 0 Debridement Performed for Wound #1 Right,Lateral Lower Leg Assessment: Performed By: Physician Tommie Sams., PA-C Debridement Type: Debridement Level of Consciousness (Pre- Awake and Alert procedure): Pre-procedure Verification/Time Out Yes - 11:09 Taken: Start Time: 11:09 Pain Control: Lidocaine Total Area Debrided (L x W): 0.4 (cm) x 0.3 (cm) = 0.12 (cm) Tissue and other material Viable, Non-Viable, Slough, Subcutaneous, Biofilm, Slough debrided: Level: Skin/Subcutaneous Tissue Debridement Description: Excisional Instrument: Curette Bleeding: Minimum Hemostasis Achieved: Pressure End Time: 11:13 Response to Treatment: Procedure was tolerated well Level of Consciousness (Post- Awake and Alert procedure): Post Debridement Measurements of Total  Wound Length: (cm) 0.4 Width: (cm) 0.3 Depth: (cm) 0.1 Volume: (cm) 0.009 Character of Wound/Ulcer Post Debridement: Improved Post Procedure Diagnosis Same as Pre-procedure Electronic Signature(s) Signed: 11/07/2020 11:30:33 AM By: Donnamarie Poag Signed: 11/07/2020 5:54:54 PM By: Worthy Keeler PA-C Entered By: Donnamarie Poag on 11/07/2020 11:10:37 Andrew [name] ([id number]) -------------------------------------------------------------------------------- HPI Details Patient Name: [name], [name] Date of Service: 11/07/2020 9:45 AM Medical Record Number: [id number] Patient Account Number: [id number] Date of Birth/Sex: 21-Jul-1950 (70 y.o. M) Treating RN: Donnamarie Poag Primary Care Provider: [name] Other Clinician: Referring Provider: [name] Treating Provider/Extender: Jeri Cos Weeks in Treatment: 0 History of Present Illness HPI Description: 11/07/2020 upon evaluation today patient presents for initial evaluation here in clinic concerning a traumatic ulcer that he has on the lateral portion of his leg in the calf region on the right. With that being said he has been on Augmentin although I do not see any signs of active infection at this time which is great news. Fortunately there is no evidence of infection currently as well which is also great news. No fevers, chills, nausea, vomiting, or diarrhea. The patient's ABI today was 1.13. He also has a history of hypertension for which she is on medication otherwise no major medical problems that would affect his healing currently. This occurred as a result of him hitting this on the edge of a storm door. Electronic Signature(s) Signed: 11/07/2020 11:18:45 AM By: Worthy Keeler PA-C Entered By: Worthy Keeler on 11/07/2020 11:18:44 Andrew [name] ([id number]) -------------------------------------------------------------------------------- Physical Exam Details Patient Name: [name], [name] Date of Service: 11/07/2020  9:45 AM Medical Record Number: [id number] Patient Account Number: [id number] Date of Birth/Sex: Nov 12, 1950 (70 y.o. M) Treating RN: Donnamarie Poag Primary Care Provider: [name] Other Clinician: Referring Provider: [name] Treating Provider/Extender: Jeri Cos Weeks in Treatment: 0 Constitutional Well-nourished and well-hydrated in no acute distress. Respiratory normal breathing without difficulty. Psychiatric this patient is able to make decisions and demonstrates good insight into disease process. Alert and Oriented x 3. pleasant and cooperative. Notes  Upon inspection patient's wound bed actually showed signs of good granulation and epithelization at this point. There does not appear to be any signs of active infection which is great news and overall very pleased with where things stand today. No fevers, chills, nausea, vomiting, or diarrhea.I did perform sharp debridement clear away some of the necrotic debris he tolerated that today without complication postdebridement wound bed appears to be doing significantly better which is great news. Electronic Signature(s) Signed: 11/07/2020 11:19:36 AM By: Worthy Keeler PA-C Entered By: Worthy Keeler on 11/07/2020 11:19:36 Andrew [name] ([id number]) -------------------------------------------------------------------------------- Physician Orders Details Patient Name: [name], [name] Date of Service: 11/07/2020 9:45 AM Medical Record Number: [id number] Patient Account Number: [id number] Date of Birth/Sex: May 08, 1950 (70 y.o. M) Treating RN: Donnamarie Poag Primary Care Provider: [name] Other Clinician: Referring Provider: [name] Treating Provider/Extender: Skipper Cliche in Treatment: 0 Verbal / Phone Orders: No Diagnosis Coding ICD-10 Coding Code Description [phone] Laceration without foreign body, right lower leg, initial encounter L97.812 Non-pressure chronic ulcer of other part of right lower leg with  fat layer exposed [place] (primary) hypertension Follow-up Appointments o Return Appointment in 1 week. o Nurse Visit as needed - call to schedule Bathing/ Shower/ Hygiene o May shower; gently cleanse wound with antibacterial soap, rinse and pat dry prior to dressing wounds - make sure dressing is clean and dry-change after shower o No tub bath. Wound Treatment Wound #1 - Lower Leg Wound Laterality: Right, Lateral Cleanser: Soap and Water 2 x Per Week/30 Days Discharge Instructions: Gently cleanse wound with antibacterial soap, rinse and pat dry prior to dressing wounds Primary Dressing: Prisma 4.34 (in) 2 x Per Week/30 Days Discharge Instructions: Moisten w/normal saline or sterile water; Cover wound as directed. Do not remove from wound bed. Secondary Dressing: Mepilex Border Flex, 4x4 (in/in) (DME) (Generic) 2 x Per Week/30 Days Discharge Instructions: Apply to wound as directed. Do not cut. Electronic Signature(s) Signed: 11/07/2020 11:30:33 AM By: Donnamarie Poag Signed: 11/07/2020 5:54:54 PM By: Worthy Keeler PA-C Entered By: Donnamarie Poag on 11/07/2020 11:15:09 [place], Andrew [name] Kitchen ([id number]) -------------------------------------------------------------------------------- Problem List Details Patient Name: Slatter, [name] Date of Service: 11/07/2020 9:45 AM Medical Record Number: [id number] Patient Account Number: [id number] Date of Birth/Sex: 1950-11-15 (70 y.o. M) Treating RN: Donnamarie Poag Primary Care Provider: [name] Other Clinician: Referring Provider: [name] Treating Provider/Extender: Jeri Cos Weeks in Treatment: 0 Active Problems ICD-10 Encounter Code Description Active Date MDM Diagnosis S81.811A Laceration without foreign body, right lower leg, initial encounter 11/07/2020 No Yes L97.812 Non-pressure chronic ulcer of other part of right lower leg with fat layer 11/07/2020 No Yes exposed [place] (primary) hypertension 11/07/2020 No  Yes Inactive Problems Resolved Problems Electronic Signature(s) Signed: 11/07/2020 10:54:26 AM By: Worthy Keeler PA-C Entered By: Worthy Keeler on 11/07/2020 10:54:25 Pintor, Jamail L. ([id number]) -------------------------------------------------------------------------------- Progress Note Details Patient Name: Betters, [name] Date of Service: 11/07/2020 9:45 AM Medical Record Number: [id number] Patient Account Number: [id number] Date of Birth/Sex: 10-04-1950 (70 y.o. M) Treating RN: Donnamarie Poag Primary Care Provider: [name] Other Clinician: Referring Provider: [name] Treating Provider/Extender: Jeri Cos Weeks in Treatment: 0 Subjective Chief Complaint Information obtained from Patient Right leg ulcer History of Present Illness (HPI) 11/07/2020 upon evaluation today patient presents for initial evaluation here in clinic concerning a traumatic ulcer that he has on the lateral portion of his leg in the calf region on the right. With that being said he has been on  Augmentin although I do not see any signs of active infection at this time which is great news. Fortunately there is no evidence of infection currently as well which is also great news. No fevers, chills, nausea, vomiting, or diarrhea. The patient's ABI today was 1.13. He also has a history of hypertension for which she is on medication otherwise no major medical problems that would affect his healing currently. This occurred as a result of him hitting this on the edge of a storm door. Patient History Information obtained from Patient. Allergies No Known Allergies Social History Never smoker, Marital Status - Married, Alcohol Use - Rarely, Drug Use - No History, Caffeine Use - Daily. Medical History Cardiovascular Patient has history of Coronary Artery Disease, Hypertension Endocrine Patient has history of Type II Diabetes Patient is treated with Controlled Diet, Oral Agents. Blood sugar is not  tested. Review of Systems (ROS) Constitutional Symptoms (General Health) Denies complaints or symptoms of Fatigue, Fever, Chills, Marked Weight Change. Eyes Complains or has symptoms of Glasses / Contacts. Ear/Nose/Mouth/Throat Denies complaints or symptoms of Difficult clearing ears, Sinusitis. Hematologic/Lymphatic Denies complaints or symptoms of Bleeding / Clotting Disorders, Human Immunodeficiency Virus. Respiratory Denies complaints or symptoms of Chronic or frequent coughs, Shortness of Breath. Cardiovascular Denies complaints or symptoms of LE edema, aorta repair several years ago and follows with cardiovascular Gastrointestinal Denies complaints or symptoms of Frequent diarrhea, Nausea, Vomiting. Genitourinary Denies complaints or symptoms of Kidney failure/ Dialysis, Incontinence/dribbling. Immunological Denies complaints or symptoms of Hives, Itching. Integumentary (Skin) Denies complaints or symptoms of Wounds, Bleeding or bruising tendency, Breakdown, Swelling. Musculoskeletal Denies complaints or symptoms of Muscle Pain, Muscle Weakness. Neurologic Denies complaints or symptoms of Numbness/parasthesias, Focal/Weakness. Psychiatric Denies complaints or symptoms of Anxiety, Claustrophobia. Caffrey, [name] ([id number]) Objective Constitutional Well-nourished and well-hydrated in no acute distress. Vitals Time Taken: 10:28 AM, Height: 73 in, Source: Stated, Weight: 181 lbs, Source: Stated, BMI: 23.9, Temperature: 98.8 F, Pulse: 65 bpm, Respiratory Rate: 16 breaths/min, Blood Pressure: 141/88 mmHg. Respiratory normal breathing without difficulty. Psychiatric this patient is able to make decisions and demonstrates good insight into disease process. Alert and Oriented x 3. pleasant and cooperative. General Notes: Upon inspection patient's wound bed actually showed signs of good granulation and epithelization at this point. There does not appear to be any signs of  active infection which is great news and overall very pleased with where things stand today. No fevers, chills, nausea, vomiting, or diarrhea.I did perform sharp debridement clear away some of the necrotic debris he tolerated that today without complication postdebridement wound bed appears to be doing significantly better which is great news. Integumentary (Hair, Skin) Wound #1 status is Open. Original cause of wound was Laceration. The date acquired was: 09/22/2020. The wound is located on the Right,Lateral Lower Leg. The wound measures 0.4cm length x 0.3cm width x 0.1cm depth; 0.094cm^2 area and 0.009cm^3 volume. There is no tunneling or undermining noted. There is a medium amount of serosanguineous drainage noted. There is medium (34-66%) red, pink granulation within the wound bed. There is a medium (34-66%) amount of necrotic tissue within the wound bed including Eschar and Adherent Slough. Assessment Active Problems ICD-10 Laceration without foreign body, right lower leg, initial encounter Non-pressure chronic ulcer of other part of right lower leg with fat layer exposed [place] (primary) hypertension Procedures Wound #1 Pre-procedure diagnosis of Wound #1 is a Trauma, Other located on the Right,Lateral Lower Leg . There was a Excisional Skin/Subcutaneous Tissue Debridement with a total area  of 0.12 sq cm performed by Tommie Sams., PA-C. With the following instrument(s): Curette to remove Viable and Non-Viable tissue/material. Material removed includes Subcutaneous Tissue, Slough, and Biofilm after achieving pain control using Lidocaine. A time out was conducted at 11:09, prior to the start of the procedure. A Minimum amount of bleeding was controlled with Pressure. The procedure was tolerated well. Post Debridement Measurements: 0.4cm length x 0.3cm width x 0.1cm depth; 0.009cm^3 volume. Character of Wound/Ulcer Post Debridement is improved. Post procedure Diagnosis Wound #1: Same as  Pre-Procedure Plan Follow-up Appointments: Return Appointment in 1 week. Nurse Visit as needed - call to schedule Bathing/ Shower/ Hygiene: May shower; gently cleanse wound with antibacterial soap, rinse and pat dry prior to dressing wounds - make sure dressing is clean and dry- change after shower No tub bath. WOUND #1: - Lower Leg Wound Laterality: Right, Lateral Cleanser: Soap and Water 2 x Per Week/30 Days Selvey, [name] ([id number]) Discharge Instructions: Gently cleanse wound with antibacterial soap, rinse and pat dry prior to dressing wounds Primary Dressing: Prisma 4.34 (in) 2 x Per Week/30 Days Discharge Instructions: Moisten w/normal saline or sterile water; Cover wound as directed. Do not remove from wound bed. Secondary Dressing: Mepilex Border Flex, 4x4 (in/in) (DME) (Generic) 2 x Per Week/30 Days Discharge Instructions: Apply to wound as directed. Do not cut. 1. Would recommend currently that we go ahead and initiate treatment with a silver collagen dressing which I think is in all some way to go. 2. Also can recommend the Zetuvit border foam dressing to cover which I think will also be beneficial. 3. I am also can recommend the patient should continue to monitor for any signs of infection though I see no issues right now and I do not think he needs any additional antibiotics currently. We will see patient back for reevaluation in 1 week here in the clinic. If anything worsens or changes patient will contact our office for additional recommendations. Electronic Signature(s) Signed: 11/07/2020 11:20:18 AM By: Worthy Keeler PA-C Entered By: Worthy Keeler on 11/07/2020 11:20:17 Sloss, Cherly [name] ([id number]) -------------------------------------------------------------------------------- ROS/PFSH Details Patient Name: Aguiniga, [name] Date of Service: 11/07/2020 9:45 AM Medical Record Number: [id number] Patient Account Number: [id number] Date of Birth/Sex: 04-25-1950  (70 y.o. M) Treating RN: Donnamarie Poag Primary Care Provider: [name] Other Clinician: Referring Provider: [name] Treating Provider/Extender: Jeri Cos Weeks in Treatment: 0 Information Obtained From Patient Constitutional Symptoms (General Health) Complaints and Symptoms: Negative for: Fatigue; Fever; Chills; Marked Weight Change Eyes Complaints and Symptoms: Positive for: Glasses / Contacts Ear/Nose/Mouth/Throat Complaints and Symptoms: Negative for: Difficult clearing ears; Sinusitis Hematologic/Lymphatic Complaints and Symptoms: Negative for: Bleeding / Clotting Disorders; Human Immunodeficiency Virus Respiratory Complaints and Symptoms: Negative for: Chronic or frequent coughs; Shortness of Breath Cardiovascular Complaints and Symptoms: Negative for: LE edema Review of System Notes: aorta repair several years ago and follows with cardiovascular Medical History: Positive for: Coronary Artery Disease; Hypertension Gastrointestinal Complaints and Symptoms: Negative for: Frequent diarrhea; Nausea; Vomiting Genitourinary Complaints and Symptoms: Negative for: Kidney failure/ Dialysis; Incontinence/dribbling Immunological Complaints and Symptoms: Negative for: Hives; Itching Integumentary (Skin) Complaints and Symptoms: Negative for: Wounds; Bleeding or bruising tendency; Breakdown; Swelling Noone, Jerre L. ([id number]) Musculoskeletal Complaints and Symptoms: Negative for: Muscle Pain; Muscle Weakness Neurologic Complaints and Symptoms: Negative for: Numbness/parasthesias; Focal/Weakness Psychiatric Complaints and Symptoms: Negative for: Anxiety; Claustrophobia Endocrine Medical History: Positive for: Type II Diabetes Time with diabetes: 2012 Treated with: Oral agents, Diet Blood sugar tested every day:  No Oncologic Immunizations Pneumococcal Vaccine: Received Pneumococcal Vaccination: No Implantable Devices None Family and Social  History Never smoker; Marital Status - Married; Alcohol Use: Rarely; Drug Use: No History; Caffeine Use: Daily; Financial Concerns: No; Food, Clothing or Shelter Needs: No; Support System Lacking: No; Transportation Concerns: No Electronic Signature(s) Signed: 11/07/2020 11:30:33 AM By: Donnamarie Poag Signed: 11/07/2020 5:54:54 PM By: Worthy Keeler PA-C Entered By: Donnamarie Poag on 11/07/2020 10:35:21 Tetrick, Kolden L. ([id number]) -------------------------------------------------------------------------------- SuperBill Details Patient Name: Celli, Render L. Date of Service: 11/07/2020 Medical Record Number: [id number] Patient Account Number: [id number] Date of Birth/Sex: June 26, 1950 (70 y.o. M) Treating RN: Donnamarie Poag Primary Care Provider: [name] Other Clinician: Referring Provider: [name] Treating Provider/Extender: Jeri Cos Weeks in Treatment: 0 Diagnosis Coding ICD-10 Codes Code Description [phone] Laceration without foreign body, right lower leg, initial encounter L97.812 Non-pressure chronic ulcer of other part of right lower leg with fat layer exposed [place] (primary) hypertension Facility Procedures CPT4 Code: [id number] Description: [phone] - WOUND CARE VISIT-LEV 2 EST PT Modifier: Quantity: 1 CPT4 Code: [id number] Description: 11042 - DEB SUBQ TISSUE 20 SQ CM/< Modifier: Quantity: 1 CPT4 Code: Description: ICD-10 Diagnosis Description [id number] Non-pressure chronic ulcer of other part of right lower leg with fat laye Modifier: r exposed Quantity: Physician Procedures CPT4 [id number] Description: WC PHYS LEVEL 3 o NEW PT Modifier: 25 Quantity: 1 CPT4 Code: Description: ICD-10 Diagnosis Description S81.811A Laceration without foreign body, right lower leg, initial encounter L97.812 Non-pressure chronic ulcer of other part of right lower leg with fat lay [place] (primary) hypertension Modifier: er exposed Quantity: CPT4 Code:  [id number] Description: 11042 - WC PHYS SUBQ TISS 20 SQ CM Modifier: Quantity: 1 CPT4 Code: Description: ICD-10 Diagnosis Description [id number] Non-pressure chronic ulcer of other part of right lower leg with fat lay Modifier: er exposed Quantity: Electronic Signature(s) Signed: 11/07/2020 11:20:33 AM By: Worthy Keeler PA-C Entered By: Worthy Keeler on 11/07/2020 11:20:32

## 2020-11-10 ENCOUNTER — Encounter: Payer: PPO | Admitting: Speech Pathology

## 2020-11-10 DIAGNOSIS — S81811A Laceration without foreign body, right lower leg, initial encounter: Secondary | ICD-10-CM | POA: Diagnosis not present

## 2020-11-11 ENCOUNTER — Encounter: Payer: PPO | Admitting: Speech Pathology

## 2020-11-11 ENCOUNTER — Ambulatory Visit: Payer: PPO | Admitting: Speech Pathology

## 2020-11-11 ENCOUNTER — Other Ambulatory Visit: Payer: Self-pay

## 2020-11-11 DIAGNOSIS — G3184 Mild cognitive impairment, so stated: Secondary | ICD-10-CM

## 2020-11-11 DIAGNOSIS — R41841 Cognitive communication deficit: Secondary | ICD-10-CM | POA: Diagnosis not present

## 2020-11-11 DIAGNOSIS — IMO0001 Reserved for inherently not codable concepts without codable children: Secondary | ICD-10-CM

## 2020-11-11 NOTE — Therapy (Signed)
[place] MAIN [facility] SERVICES [address] [place], [place], [address] Phone: [phone]   Fax:  [phone]  Speech Language Pathology Treatment Speech Therapy Progress Note   Dates of reporting period  10/08/2020   to   11/11/2020   Patient Details  Name: Andrew [name] MRN: [id number] Date of Birth: 21-Feb-1951 Referring Provider (SLP): Jennings Books   Encounter Date: 11/11/2020   End of Session - 11/11/20 1644     Visit Number 10    Number of Visits 25    Date for SLP Re-Evaluation 01/02/21    Authorization Type [employer]    Authorization Time Period 10/08/2020 thru 01/12/2021    Authorization - Visit Number 10    Progress Note Due on Visit 10    SLP Start Time 0905    SLP Stop Time  1000    SLP Time Calculation (min) 55 min    Activity Tolerance Patient tolerated treatment well             Past Medical History:  Diagnosis Date   Depression    Diabetes mellitus without complication ([place])    Hyperlipidemia    Hypertension     Past Surgical History:  Procedure Laterality Date   COLONOSCOPY WITH PROPOFOL N/A 07/13/2019   Procedure: COLONOSCOPY WITH PROPOFOL;  Surgeon: [name], MD;  Location: [facility] ENDOSCOPY;  Service: Endoscopy;  Laterality: N/A;   NASAL SINUS SURGERY     REPAIR OF ACUTE ASCENDING THORACIC AORTIC DISSECTION  12/2018    There were no vitals filed for this visit.   Subjective Assessment - 11/11/20 1643     Subjective "I woke up at 5 o'clock this morning, I couldn't go back to sleep"    Currently in Pain? No/denies                   ADULT SLP TREATMENT - 11/11/20 0001       Treatment Provided   Treatment provided Cognitive-Linquistic      Cognitive-Linquistic Treatment   Treatment focused on Cognition;Patient/family/caregiver education    Skilled Treatment COGNITION: 82% accuracy when problem solving semi-complex to complex problem solving task, improving to 100% with  supervision question cues              SLP Education - 11/11/20 1644     Education Details improved ability today    Person(s) Educated Patient    Methods Explanation;Demonstration    Comprehension Verbalized understanding;Returned demonstration              SLP Short Term Goals - 11/11/20 1644       SLP SHORT TERM GOAL #1   Title With minimal cues, pt will complete semi-complex reasoning task with 90% accuracy.    Time 10    Period --   sessions   Status On-going      SLP SHORT TERM GOAL #2   Title With minimal assistance, pt will complete tasks focusing on inferential reasoning with 80% accuracy.    Baseline goal met in time period, goal updated    Time 10    Period --   sessions   Status Revised      SLP SHORT TERM GOAL #3   Title Pt will demonstrate selective attention for 45 minutes in a moderately distracting environment.    Time 10    Period --   session   Status On-going      SLP SHORT TERM GOAL #4   Title  With minimal assistance, pt will demonstrate anticipatory awareness by providing appropriate solution to hypothetical safety situation.    Baseline goal met in time period, goal revised    Time 10    Period --   sessions   Status New              SLP Long Term Goals - 11/11/20 1646       SLP LONG TERM GOAL #1   Title Pt will complete complex reasoning tasks with > 90% ability.    Status On-going    Target Date 01/02/21      SLP LONG TERM GOAL #2   Title Pt will demonstrate divided attention to task for 30 minute intervals.    Status On-going    Target Date 01/02/21      SLP LONG TERM GOAL #3   Title Pt and wife will identify cognitive communication barriers and create compensatory strategies to increase pt's functional independence within family, leisure and community activities.    Status On-going    Target Date 01/02/21              Plan - 11/11/20 1647     Clinical Impression Statement Pt continues to be eager and is  demonstrating great ability to carry over and implement strategies taught in sessions. As a result, he has successfully met 2 of 4 STGs and is making steady progress towards his LTGs.             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, sequela [facility])    Problem List Patient Active Problem List   Diagnosis Date Noted   Aortic atherosclerosis ([place]) 05/02/2020   Screening for colon cancer    Aortic dissection proximal to innominate ([place]) 12/23/2018   Mixed hyperlipidemia 10/05/2016   Familial multiple lipoprotein-type hyperlipidemia 08/02/2014   Routine general medical examination at a health care facility 08/02/2014   Recurrent major depressive episodes ([place]) 08/02/2014   Essential (primary) hypertension 08/02/2014   Overweight 08/02/2014   Diabetes mellitus, type 2 ([place]) 08/02/2014   Jawana Reagor B. Rutherford Nail M.S., CCC-SLP, [place] Pathologist Rehabilitation Services Office [phone]  Stormy Fabian 11/11/2020, 4:47 PM  [place] MAIN [facility] SERVICES [address] [place], [place], [address] Phone: [phone]   Fax:  [phone]   Name: Andrew [name] MRN: [id number] Date of Birth: 1950-10-30

## 2020-11-13 ENCOUNTER — Ambulatory Visit: Payer: PPO | Admitting: Speech Pathology

## 2020-11-13 ENCOUNTER — Other Ambulatory Visit: Payer: Self-pay

## 2020-11-13 DIAGNOSIS — IMO0001 Reserved for inherently not codable concepts without codable children: Secondary | ICD-10-CM

## 2020-11-13 DIAGNOSIS — G3184 Mild cognitive impairment, so stated: Secondary | ICD-10-CM

## 2020-11-13 DIAGNOSIS — R41841 Cognitive communication deficit: Secondary | ICD-10-CM | POA: Diagnosis not present

## 2020-11-14 NOTE — Therapy (Signed)
[place] MAIN [facility] SERVICES [address] [place], [place], [address] Phone: [phone]   Fax:  [phone]  Speech Language Pathology Treatment  Patient Details  Name: Andrew [name] MRN: [id number] Date of Birth: 07-15-1950 Referring Provider (SLP): Jennings Books   Encounter Date: 11/13/2020   End of Session - 11/14/20 1359     Visit Number 11    Number of Visits 25    Date for SLP Re-Evaluation 01/02/21    Authorization Type [employer]    Authorization Time Period 10/08/2020 thru 01/12/2021    Authorization - Visit Number 1    Progress Note Due on Visit 10    SLP Start Time 0905    SLP Stop Time  1000    SLP Time Calculation (min) 55 min    Activity Tolerance Patient tolerated treatment well             Past Medical History:  Diagnosis Date   Depression    Diabetes mellitus without complication ([place])    Hyperlipidemia    Hypertension     Past Surgical History:  Procedure Laterality Date   COLONOSCOPY WITH PROPOFOL N/A 07/13/2019   Procedure: COLONOSCOPY WITH PROPOFOL;  Surgeon: [name], MD;  Location: [facility] ENDOSCOPY;  Service: Endoscopy;  Laterality: N/A;   NASAL SINUS SURGERY     REPAIR OF ACUTE ASCENDING THORACIC AORTIC DISSECTION  12/2018    There were no vitals filed for this visit.   Subjective Assessment - 11/14/20 1357     Subjective "I woke up at 5am again this morning"    Currently in Pain? No/denies                   ADULT SLP TREATMENT - 11/14/20 0001       Treatment Provided   Treatment provided Cognitive-Linquistic      Cognitive-Linquistic Treatment   Treatment focused on Cognition;Patient/family/caregiver education    Skilled Treatment COGNITION: 86% accuracy when problem solving semi-complex to complex problem solving task; much decreased impulsitivity at around `10% of opportunities              SLP Education - 11/14/20 1359     Education Details continued  improved ability    Person(s) Educated Patient    Methods Explanation;Demonstration;Verbal cues    Comprehension Verbalized understanding              SLP Short Term Goals - 11/11/20 1644       SLP SHORT TERM GOAL #1   Title With minimal cues, pt will complete semi-complex reasoning task with 90% accuracy.    Time 10    Period --   sessions   Status On-going      SLP SHORT TERM GOAL #2   Title With minimal assistance, pt will complete tasks focusing on inferential reasoning with 80% accuracy.    Baseline goal met in time period, goal updated    Time 10    Period --   sessions   Status Revised      SLP SHORT TERM GOAL #3   Title Pt will demonstrate selective attention for 45 minutes in a moderately distracting environment.    Time 10    Period --   session   Status On-going      SLP SHORT TERM GOAL #4   Title With minimal assistance, pt will demonstrate anticipatory awareness by providing appropriate solution to hypothetical safety situation.    Baseline goal met in time  period, goal revised    Time 10    Period --   sessions   Status New              SLP Long Term Goals - 11/11/20 1646       SLP LONG TERM GOAL #1   Title Pt will complete complex reasoning tasks with > 90% ability.    Status On-going    Target Date 01/02/21      SLP LONG TERM GOAL #2   Title Pt will demonstrate divided attention to task for 30 minute intervals.    Status On-going    Target Date 01/02/21      SLP LONG TERM GOAL #3   Title Pt and wife will identify cognitive communication barriers and create compensatory strategies to increase pt's functional independence within family, leisure and community activities.    Status On-going    Target Date 01/02/21              Plan - 11/14/20 1359     Clinical Impression Statement Pt continues to show improved with executive functioning during previously introduced complex problem solving task. He continues to benefit from assistance  for emergent awareness and insight into cognitive processes being targeted. As such, skilled ST ocntinues to be indicated to target pt's mild cognitive deficits in an effort to increase pt's functional independence and reduce caregiver burden.    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, sequela [facility])    Problem List Patient Active Problem List   Diagnosis Date Noted   Aortic atherosclerosis ([place]) 05/02/2020   Screening for colon cancer    Aortic dissection proximal to innominate ([place]) 12/23/2018   Mixed hyperlipidemia 10/05/2016   Familial multiple lipoprotein-type hyperlipidemia 08/02/2014   Routine general medical examination at a health care facility 08/02/2014   Recurrent major depressive episodes ([place]) 08/02/2014   Essential (primary) hypertension 08/02/2014   Overweight 08/02/2014   Diabetes mellitus, type 2 ([place]) 08/02/2014   Daryana Whirley B. Rutherford Nail M.S., CCC-SLP, [place] Pathologist Rehabilitation Services Office [phone]  Stormy Fabian 11/14/2020, 2:04 PM  [place] MAIN [facility] SERVICES [address] [place], [place], [address] Phone: [phone]   Fax:  [phone]   Name: Andrew [name] MRN: [id number] Date of Birth: 09-Mar-1951

## 2020-11-17 ENCOUNTER — Other Ambulatory Visit: Payer: Self-pay

## 2020-11-17 ENCOUNTER — Encounter: Payer: PPO | Admitting: Physician Assistant

## 2020-11-17 ENCOUNTER — Encounter: Payer: PPO | Admitting: Speech Pathology

## 2020-11-17 DIAGNOSIS — E11622 Type 2 diabetes mellitus with other skin ulcer: Secondary | ICD-10-CM | POA: Diagnosis not present

## 2020-11-17 DIAGNOSIS — L97812 Non-pressure chronic ulcer of other part of right lower leg with fat layer exposed: Secondary | ICD-10-CM | POA: Diagnosis not present

## 2020-11-17 NOTE — Progress Notes (Addendum)
LAUDIE, STEININGER ([id number]) Visit Report for 11/17/2020 Chief Complaint Document Details Patient Name: Rojero, Danh L. Date of Service: 11/17/2020 8:00 AM Medical Record Number: [id number] Patient Account Number: [id number] Date of Birth/Sex: [birth year] (70 y.o. M) Treating RN: Cornell Barman Primary Care Provider: [name] Other Clinician: Referring Provider: [name] Treating Provider/Extender: Jeri Cos Weeks in Treatment: 1 Information Obtained from: Patient Chief Complaint Right leg ulcer Electronic Signature(s) Signed: 11/17/2020 8:26:18 AM By: Worthy Keeler PA-C Entered By: Worthy Keeler on 11/17/2020 08:26:18 [place], [name] ([id number]) -------------------------------------------------------------------------------- Debridement Details Patient Name: Weinmann, Yoni L. Date of Service: 11/17/2020 8:00 AM Medical Record Number: [id number] Patient Account Number: [id number] Date of Birth/Sex: 11/28/1950 (70 y.o. M) Treating RN: Cornell Barman Primary Care Provider: [name] Other Clinician: Referring Provider: [name] Treating Provider/Extender: Jeri Cos Weeks in Treatment: 1 Debridement Performed for Wound #1 Right,Lateral Lower Leg Assessment: Performed By: Physician Tommie Sams., PA-C Debridement Type: Debridement Level of Consciousness (Pre- Awake and Alert procedure): Pre-procedure Verification/Time Out Yes - 09:28 Taken: Pain Control: Lidocaine Total Area Debrided (L x W): 0.5 (cm) x 0.4 (cm) = 0.2 (cm) Tissue and other material Viable, Eschar, Slough, Subcutaneous, Slough debrided: Level: Skin/Subcutaneous Tissue Debridement Description: Excisional Instrument: Curette Bleeding: Minimum Hemostasis Achieved: Pressure Response to Treatment: Procedure was tolerated well Level of Consciousness (Post- Awake and Alert procedure): Post Debridement Measurements of Total Wound Length: (cm) 0.5 Width: (cm) 0.4 Depth: (cm) 0.2 Volume:  (cm) 0.031 Character of Wound/Ulcer Post Debridement: Stable Post Procedure Diagnosis Same as Pre-procedure Electronic Signature(s) Signed: 11/17/2020 12:17:32 PM By: Gretta Cool, BSN, RN, CWS, Kim RN, BSN Signed: 11/17/2020 5:15:29 PM By: Worthy Keeler PA-C Entered By: Gretta Cool, BSN, RN, CWS, Kim on 11/17/2020 08:30:37 [place], [name] ([id number]) -------------------------------------------------------------------------------- HPI Details Patient Name: Kielty, Donnald L. Date of Service: 11/17/2020 8:00 AM Medical Record Number: [id number] Patient Account Number: [id number] Date of Birth/Sex: January 23, 1951 (70 y.o. M) Treating RN: Cornell Barman Primary Care Provider: [name] Other Clinician: Referring Provider: [name] Treating Provider/Extender: Jeri Cos Weeks in Treatment: 1 History of Present Illness HPI Description: 11/07/2020 upon evaluation today patient presents for initial evaluation here in clinic concerning a traumatic ulcer that he has on the lateral portion of his leg in the calf region on the right. With that being said he has been on Augmentin although I do not see any signs of active infection at this time which is great news. Fortunately there is no evidence of infection currently as well which is also great news. No fevers, chills, nausea, vomiting, or diarrhea. The patient's ABI today was 1.13. He also has a history of hypertension for which she is on medication otherwise no major medical problems that would affect his healing currently. This occurred as a result of him hitting this on the edge of a storm door. 11/17/2020 upon evaluation today patient appears to be doing better in regard to his wound. With that being said this does still appear to be open but is not nearly the size that it was previous. I am very pleased actually with where things stand and I think that the patient is making excellent progress here. Still I think that there is a small opening but again I  think the collagen can help he just needs to make sure to change this on a every other day basis. Electronic Signature(s) Signed: 11/17/2020 8:51:05 AM By: Worthy Keeler PA-C Entered By: Worthy Keeler on 11/17/2020 08:51:05 Apgar, Jalien L. ([id number]) --------------------------------------------------------------------------------  Physical Exam Details Patient Name: Waltz, Abdiaziz L. Date of Service: 11/17/2020 8:00 AM Medical Record Number: [id number] Patient Account Number: [id number] Date of Birth/Sex: 1951-01-24 (70 y.o. M) Treating RN: Cornell Barman Primary Care Provider: [name] Other Clinician: Referring Provider: [name] Treating Provider/Extender: Jeri Cos Weeks in Treatment: 1 Constitutional Well-nourished and well-hydrated in no acute distress. Respiratory normal breathing without difficulty. Psychiatric this patient is able to make decisions and demonstrates good insight into disease process. Alert and Oriented x 3. pleasant and cooperative. Notes Upon inspection patient's wound bed actually showed signs of good granulation underneath the eschar but I did have to remove the eschar there was still a small opening this does appear to be much better than last time I saw him nonetheless I think that he is still showing signs of an open wound currently. We will get a continue to monitor I do feel like is about half the size it was last week. Electronic Signature(s) Signed: 11/17/2020 8:51:28 AM By: Worthy Keeler PA-C Entered By: Worthy Keeler on 11/17/2020 08:51:28 [place], [name] ([id number]) -------------------------------------------------------------------------------- Physician Orders Details Patient Name: Kowalewski, Jahel L. Date of Service: 11/17/2020 8:00 AM Medical Record Number: [id number] Patient Account Number: [id number] Date of Birth/Sex: 02-28-1951 (70 y.o. M) Treating RN: Cornell Barman Primary Care Provider: [name] Other  Clinician: Referring Provider: [name] Treating Provider/Extender: Skipper Cliche in Treatment: 1 Verbal / Phone Orders: No Diagnosis Coding ICD-10 Coding Code Description [phone] Laceration without foreign body, right lower leg, initial encounter L97.812 Non-pressure chronic ulcer of other part of right lower leg with fat layer exposed [place] (primary) hypertension Follow-up Appointments o Return Appointment in 1 week. o Nurse Visit as needed - call to schedule Bathing/ Shower/ Hygiene o May shower; gently cleanse wound with antibacterial soap, rinse and pat dry prior to dressing wounds - make sure dressing is clean and dry-change after shower o No tub bath. Wound Treatment Wound #1 - Lower Leg Wound Laterality: Right, Lateral Cleanser: Soap and Water 2 x Per Week/30 Days Discharge Instructions: Gently cleanse wound with antibacterial soap, rinse and pat dry prior to dressing wounds Primary Dressing: Prisma 4.34 (in) 2 x Per Week/30 Days Discharge Instructions: Moisten w/normal saline or sterile water; Cover wound as directed. Do not remove from wound bed. Secondary Dressing: Mepilex Border Flex, 4x4 (in/in) (Generic) 2 x Per Week/30 Days Discharge Instructions: Apply to wound as directed. Do not cut. Electronic Signature(s) Signed: 11/17/2020 12:17:32 PM By: Gretta Cool, BSN, RN, CWS, Kim RN, BSN Signed: 11/17/2020 5:15:29 PM By: Worthy Keeler PA-C Entered By: Gretta Cool, BSN, RN, CWS, Kim on 11/17/2020 08:31:05 [place], [name] ([id number]) -------------------------------------------------------------------------------- Problem List Details Patient Name: Truss, Mart L. Date of Service: 11/17/2020 8:00 AM Medical Record Number: [id number] Patient Account Number: [id number] Date of Birth/Sex: [birth year] (70 y.o. M) Treating RN: Cornell Barman Primary Care Provider: [name] Other Clinician: Referring Provider: [name] Treating Provider/Extender: Jeri Cos Weeks in Treatment: 1 Active Problems ICD-10 Encounter Code Description Active Date MDM Diagnosis [phone] Laceration without foreign body, right lower leg, initial encounter 11/07/2020 No Yes L97.812 Non-pressure chronic ulcer of other part of right lower leg with fat layer 11/07/2020 No Yes exposed [place] (primary) hypertension 11/07/2020 No Yes Inactive Problems Resolved Problems Electronic Signature(s) Signed: 11/17/2020 8:26:13 AM By: Worthy Keeler PA-C Entered By: Worthy Keeler on 11/17/2020 08:26:12 [place], [place] ([id number]) -------------------------------------------------------------------------------- Progress Note Details Patient Name: Asbill, Enio L. Date of Service: 11/17/2020 8:00 AM  Medical Record Number: [id number] Patient Account Number: [id number] Date of Birth/Sex: [birth year] (70 y.o. M) Treating RN: Cornell Barman Primary Care Provider: [name] Other Clinician: Referring Provider: [name] Treating Provider/Extender: Skipper Cliche in Treatment: 1 Subjective Chief Complaint Information obtained from Patient Right leg ulcer History of Present Illness (HPI) 11/07/2020 upon evaluation today patient presents for initial evaluation here in clinic concerning a traumatic ulcer that he has on the lateral portion of his leg in the calf region on the right. With that being said he has been on Augmentin although I do not see any signs of active infection at this time which is great news. Fortunately there is no evidence of infection currently as well which is also great news. No fevers, chills, nausea, vomiting, or diarrhea. The patient's ABI today was 1.13. He also has a history of hypertension for which she is on medication otherwise no major medical problems that would affect his healing currently. This occurred as a result of him hitting this on the edge of a storm door. 11/17/2020 upon evaluation today patient appears to be doing better in  regard to his wound. With that being said this does still appear to be open but is not nearly the size that it was previous. I am very pleased actually with where things stand and I think that the patient is making excellent progress here. Still I think that there is a small opening but again I think the collagen can help he just needs to make sure to change this on a every other day basis. Objective Constitutional Well-nourished and well-hydrated in no acute distress. Vitals Time Taken: 8:19 AM, Height: 73 in, Weight: 181 lbs, BMI: 23.9, Temperature: 98.4 F, Pulse: 65 bpm, Respiratory Rate: 16 breaths/min, Blood Pressure: 149/77 mmHg. Respiratory normal breathing without difficulty. Psychiatric this patient is able to make decisions and demonstrates good insight into disease process. Alert and Oriented x 3. pleasant and cooperative. General Notes: Upon inspection patient's wound bed actually showed signs of good granulation underneath the eschar but I did have to remove the eschar there was still a small opening this does appear to be much better than last time I saw him nonetheless I think that he is still showing signs of an open wound currently. We will get a continue to monitor I do feel like is about half the size it was last week. Integumentary (Hair, Skin) Wound #1 status is Open. Original cause of wound was Laceration. The date acquired was: 09/22/2020. The wound has been in treatment 1 weeks. The wound is located on the Right,Lateral Lower Leg. The wound measures 0.5cm length x 0.4cm width x 0.1cm depth; 0.157cm^2 area and 0.016cm^3 volume. There is no tunneling or undermining noted. There is a none present amount of drainage noted. There is no granulation within the wound bed. There is a large (67-100%) amount of necrotic tissue within the wound bed including Adherent Slough. Assessment Active Problems ICD-10 Laceration without foreign body, right lower leg, initial  encounter Non-pressure chronic ulcer of other part of right lower leg with fat layer exposed [place], [place] ([id number]) Essential (primary) hypertension Procedures Wound #1 Pre-procedure diagnosis of Wound #1 is a Trauma, Other located on the Right,Lateral Lower Leg . There was a Excisional Skin/Subcutaneous Tissue Debridement with a total area of 0.2 sq cm performed by Tommie Sams., PA-C. With the following instrument(s): Curette to remove Viable tissue/material. Material removed includes Eschar, Subcutaneous Tissue, and Slough after achieving pain control using Lidocaine.  No specimens were taken. A time out was conducted at 09:28, prior to the start of the procedure. A Minimum amount of bleeding was controlled with Pressure. The procedure was tolerated well. Post Debridement Measurements: 0.5cm length x 0.4cm width x 0.2cm depth; 0.031cm^3 volume. Character of Wound/Ulcer Post Debridement is stable. Post procedure Diagnosis Wound #1: Same as Pre-Procedure Plan Follow-up Appointments: Return Appointment in 1 week. Nurse Visit as needed - call to schedule Bathing/ Shower/ Hygiene: May shower; gently cleanse wound with antibacterial soap, rinse and pat dry prior to dressing wounds - make sure dressing is clean and dry- change after shower No tub bath. WOUND #1: - Lower Leg Wound Laterality: Right, Lateral Cleanser: Soap and Water 2 x Per Week/30 Days Discharge Instructions: Gently cleanse wound with antibacterial soap, rinse and pat dry prior to dressing wounds Primary Dressing: Prisma 4.34 (in) 2 x Per Week/30 Days Discharge Instructions: Moisten w/normal saline or sterile water; Cover wound as directed. Do not remove from wound bed. Secondary Dressing: Mepilex Border Flex, 4x4 (in/in) (Generic) 2 x Per Week/30 Days Discharge Instructions: Apply to wound as directed. Do not cut. 1. Would recommend currently that we go ahead and continue with wound care measures as before. Patient  is in agreement with the plan. This includes the use of the silver collagen along with the border foam dressing. 2. I am also can recommend that the patient continue to monitor for any signs of worsening such as increased pain, redness, or drainage. I do not expect that to occur but nonetheless we will keep an eye on things. We will see patient back for reevaluation in 1 week here in the clinic. If anything worsens or changes patient will contact our office for additional recommendations. Electronic Signature(s) Signed: 11/17/2020 8:52:49 AM By: Worthy Keeler PA-C Entered By: Worthy Keeler on 11/17/2020 08:52:49 [place], [name] ([id number]) -------------------------------------------------------------------------------- SuperBill Details Patient Name: Troop, Lennin L. Date of Service: 11/17/2020 Medical Record Number: [id number] Patient Account Number: [id number] Date of Birth/Sex: [birth year] (70 y.o. M) Treating RN: Cornell Barman Primary Care Provider: [name] Other Clinician: Referring Provider: [name] Treating Provider/Extender: Jeri Cos Weeks in Treatment: 1 Diagnosis Coding ICD-10 Codes Code Description [phone] Laceration without foreign body, right lower leg, initial encounter L97.812 Non-pressure chronic ulcer of other part of right lower leg with fat layer exposed [place] (primary) hypertension Facility Procedures CPT4 Code: [id number] Description: [id number] - DEB SUBQ TISSUE 20 SQ CM/< Modifier: Quantity: 1 CPT4 Code: Description: ICD-10 Diagnosis Description [id number] Non-pressure chronic ulcer of other part of right lower leg with fat lay Modifier: er exposed Quantity: Physician Procedures CPT4 Code: [id number] Description: [id number] - WC PHYS SUBQ TISS 20 SQ CM Modifier: Quantity: 1 CPT4 Code: Description: ICD-10 Diagnosis Description [id number] Non-pressure chronic ulcer of other part of right lower leg with fat lay Modifier: er  exposed Quantity: Electronic Signature(s) Signed: 11/17/2020 8:52:59 AM By: Worthy Keeler PA-C Entered By: Worthy Keeler on 11/17/2020 08:52:58

## 2020-11-17 NOTE — Progress Notes (Signed)
Andrew [name], Andrew [name] ([id number]) Visit Report for 11/17/2020 Arrival Information Details Patient Name: Andrew [name], Andrew [name]. Date of Service: 11/17/2020 8:00 AM Medical Record Number: [id number] Patient Account Number: [id number] Date of Birth/Sex: [birth year] (70 y.o. M) Treating RN: Cornell Barman Primary Care Kila Godina: [name] Other Clinician: Referring Jaiyon Wander: [name] Treating Johnchristopher Sarvis/Extender: Skipper Cliche in Treatment: 1 Visit Information History Since Last Visit Has Dressing in Place as Prescribed: No Patient Arrived: Ambulatory Pain Present Now: No Arrival Time: 08:19 Accompanied By: self Transfer Assistance: None Patient Identification Verified: Yes Secondary Verification Process Completed: Yes Patient Has Alerts: Yes Patient Alerts: Patient on Blood Thinner Aspirin '81mg'$  Diabetic Electronic Signature(s) Signed: 11/17/2020 12:17:32 PM By: Gretta Cool, BSN, RN, CWS, Kim RN, BSN Entered By: Gretta Cool, BSN, RN, CWS, Kim on 11/17/2020 08:19:37 Andrew [name] ([id number]) -------------------------------------------------------------------------------- Encounter Discharge Information Details Patient Name: [name], [name] Date of Service: 11/17/2020 8:00 AM Medical Record Number: [id number] Patient Account Number: [id number] Date of Birth/Sex: 1950/10/03 (70 y.o. M) Treating RN: Cornell Barman Primary Care Eivin Mascio: [name] Other Clinician: Referring [place] Bryngelson: [name] Treating Shamarion Coots/Extender: Skipper Cliche in Treatment: 1 Encounter Discharge Information Items Post Procedure Vitals Discharge Condition: Stable Unable to obtain vitals Reason: limited time Ambulatory Status: Ambulatory Discharge Destination: Home Transportation: Private Auto Accompanied By: self Schedule Follow-up Appointment: Yes Clinical Summary of Care: Electronic Signature(s) Signed: 11/17/2020 12:17:32 PM By: Gretta Cool, BSN, RN, CWS, Kim RN, BSN Entered By: Gretta Cool, BSN, RN, CWS, Kim on  11/17/2020 08:39:03 Andrew [name] ([id number]) -------------------------------------------------------------------------------- Lower Extremity Assessment Details Patient Name: [name], [name] Date of Service: 11/17/2020 8:00 AM Medical Record Number: [id number] Patient Account Number: [id number] Date of Birth/Sex: 1950/05/29 (70 y.o. M) Treating RN: Cornell Barman Primary Care Khadeeja Elden: [name] Other Clinician: Referring Sanika Brosious: [name] Treating Keyaan Lederman/Extender: Jeri Cos Weeks in Treatment: 1 Edema Assessment Assessed: [Left: No] [Right: No] Edema: [Left: N] [Right: o] Vascular Assessment Pulses: Dorsalis Pedis Palpable: [Right:Yes] Electronic Signature(s) Signed: 11/17/2020 12:17:32 PM By: Gretta Cool, BSN, RN, CWS, Kim RN, BSN Entered By: Gretta Cool, BSN, RN, CWS, Kim on 11/17/2020 08:23:16 Andrew [name] ([id number]) -------------------------------------------------------------------------------- Multi Wound Chart Details Patient Name: [name], [name] Date of Service: 11/17/2020 8:00 AM Medical Record Number: [id number] Patient Account Number: [id number] Date of Birth/Sex: 1950-07-20 (70 y.o. M) Treating RN: Cornell Barman Primary Care Shanyia Stines: [name] Other Clinician: Referring Raylyn Speckman: [name] Treating Daizee Firmin/Extender: Jeri Cos Weeks in Treatment: 1 Vital Signs Height(in): 73 Pulse(bpm): 65 Weight(lbs): 181 Blood Pressure(mmHg): 149/77 Body Mass Index(BMI): 24 Temperature(F): 98.4 Respiratory Rate(breaths/min): 16 Photos: [1:No Photos] [N/A:N/A] Wound Location: [1:Right, Lateral Lower Leg] [N/A:N/A] Wounding Event: [1:Laceration] [N/A:N/A] Primary Etiology: [1:Trauma, Other] [N/A:N/A] Comorbid History: [1:Coronary Artery Disease, Hypertension, Type II Diabetes] [N/A:N/A] Date Acquired: [1:09/22/2020] [N/A:N/A] Weeks of Treatment: [1:1] [N/A:N/A] Wound Status: [1:Open] [N/A:N/A] Measurements L x W x D (cm) [1:0.5x0.4x0.1] [N/A:N/A] Area  (cm) : [1:0.157] [N/A:N/A] Volume (cm) : [1:0.016] [N/A:N/A] % Reduction in Area: [1:-67.00%] [N/A:N/A] % Reduction in Volume: [1:-77.80%] [N/A:N/A] Classification: [1:Full Thickness Without Exposed Support Structures] [N/A:N/A] Exudate Amount: [1:None Present] [N/A:N/A] Granulation Amount: [1:None Present (0%)] [N/A:N/A] Necrotic Amount: [1:Large (67-100%) None] [N/A:N/A N/A] Treatment Notes Electronic Signature(s) Signed: 11/17/2020 12:17:32 PM By: Gretta Cool, BSN, RN, CWS, Kim RN, BSN Entered By: Gretta Cool, BSN, RN, CWS, Kim on 11/17/2020 08:27:36 Andrew [name] ([id number]) -------------------------------------------------------------------------------- [place] Details Patient Name: [name], [name] Date of Service: 11/17/2020 8:00 AM Medical Record Number: [id number] Patient Account Number: [id number] Date of Birth/Sex: [birth year] (70 y.o. M) Treating RN: Cornell Barman  Primary Care Janeah Kovacich: [name] Other Clinician: Referring Cortney Mckinney: [name] Treating Navid Lenzen/Extender: Skipper Cliche in Treatment: 1 Active Inactive Orientation to the Wound Care Program Nursing Diagnoses: Knowledge deficit related to the wound healing center program Goals: Patient/caregiver will verbalize understanding of the [place] Date Initiated: 11/07/2020 Target Resolution Date: 11/26/2020 Goal Status: Active Interventions: Provide education on orientation to the wound center Notes: Wound/Skin Impairment Nursing Diagnoses: Impaired tissue integrity Knowledge deficit related to smoking impact on wound healing Knowledge deficit related to ulceration/compromised skin integrity Goals: Patient/caregiver will verbalize understanding of skin care regimen Date Initiated: 11/07/2020 Target Resolution Date: 11/26/2020 Goal Status: Active Ulcer/skin breakdown will have a volume reduction of 30% by week 4 Date Initiated: 11/07/2020 Target Resolution Date:  12/08/2020 Goal Status: Active Ulcer/skin breakdown will have a volume reduction of 50% by week 8 Date Initiated: 11/07/2020 Target Resolution Date: 01/07/2021 Goal Status: Active Ulcer/skin breakdown will have a volume reduction of 80% by week 12 Date Initiated: 11/07/2020 Target Resolution Date: 02/07/2021 Goal Status: Active Interventions: Assess patient/caregiver ability to obtain necessary supplies Assess patient/caregiver ability to perform ulcer/skin care regimen upon admission and as needed Assess ulceration(s) every visit Notes: Electronic Signature(s) Signed: 11/17/2020 12:17:32 PM By: Gretta Cool, BSN, RN, CWS, Kim RN, BSN Entered By: Gretta Cool, BSN, RN, CWS, Kim on 11/17/2020 08:23:56 Andrew [name] ([id number]) -------------------------------------------------------------------------------- Pain Assessment Details Patient Name: Towles, Preciliano L. Date of Service: 11/17/2020 8:00 AM Medical Record Number: [id number] Patient Account Number: [id number] Date of Birth/Sex: [birth year] (70 y.o. M) Treating RN: Cornell Barman Primary Care Gwendolen Hewlett: [name] Other Clinician: Referring Dmiya Malphrus: [name] Treating Adelyne Marchese/Extender: Jeri Cos Weeks in Treatment: 1 Active Problems Location of Pain Severity and Description of Pain Patient Has Paino No Site Locations Pain Management and Medication Current Pain Management: Electronic Signature(s) Signed: 11/17/2020 12:17:32 PM By: Gretta Cool, BSN, RN, CWS, Kim RN, BSN Entered By: Gretta Cool, BSN, RN, CWS, Kim on 11/17/2020 08:20:27 Andrew [name] ([id number]) -------------------------------------------------------------------------------- Patient/Caregiver Education Details Patient Name: [name], [name] Date of Service: 11/17/2020 8:00 AM Medical Record Number: [id number] Patient Account Number: [id number] Date of Birth/Gender: [birth year] (70 y.o. M) Treating RN: Cornell Barman Primary Care Physician: [name] Other  Clinician: Referring Physician: [name] Treating Physician/Extender: Skipper Cliche in Treatment: 1 Education Assessment Education Provided To: Patient Education Topics Provided Wound/Skin Impairment: Handouts: Caring for Your Ulcer, Other: wound care as prescribed Methods: Demonstration, Explain/Verbal Responses: State content correctly Electronic Signature(s) Signed: 11/17/2020 12:17:32 PM By: Gretta Cool, BSN, RN, CWS, Kim RN, BSN Entered By: Gretta Cool, BSN, RN, CWS, Kim on 11/17/2020 08:31:31 Andrew [name] ([id number]) -------------------------------------------------------------------------------- Wound Assessment Details Patient Name: Edds, Dillinger L. Date of Service: 11/17/2020 8:00 AM Medical Record Number: [id number] Patient Account Number: [id number] Date of Birth/Sex: 1950/07/30 (70 y.o. M) Treating RN: Cornell Barman Primary Care Ruba Outen: [name] Other Clinician: Referring Brittan Mapel: [name] Treating Itamar Mcgowan/Extender: Jeri Cos Weeks in Treatment: 1 Wound Status Wound Number: 1 Primary Etiology: Trauma, Other Wound Location: Right, Lateral Lower Leg Wound Status: Open Wounding Event: Laceration Comorbid Coronary Artery Disease, Hypertension, Type II History: Diabetes Date Acquired: 09/22/2020 Weeks Of Treatment: 1 Clustered Wound: No Photos Photo Uploaded By: Gretta Cool, BSN, RN, CWS, Kim on 11/17/2020 10:03:35 Wound Measurements Length: (cm) 0.5 Width: (cm) 0.4 Depth: (cm) 0.1 Area: (cm) 0.157 Volume: (cm) 0.016 % Reduction in Area: -67% % Reduction in Volume: -77.8% Epithelialization: None Tunneling: No Undermining: No Wound Description Classification: Full Thickness Without Exposed Support Structures Exudate Amount: None Present Foul Odor After  Cleansing: No Slough/Fibrino No Wound Bed Granulation Amount: None Present (0%) Necrotic Amount: Large (67-100%) Necrotic Quality: Adherent Slough Treatment Notes Wound #1 (Lower Leg) Wound  Laterality: Right, Lateral Cleanser Soap and Water Discharge Instruction: Gently cleanse wound with antibacterial soap, rinse and pat dry prior to dressing wounds Peri-Wound Care Topical Primary Dressing Prisma 4.34 (in) [name], [name] ([id number]) Discharge Instruction: Moisten w/normal saline or sterile water; Cover wound as directed. Do not remove from wound bed. Secondary Dressing Mepilex Border Flex, 4x4 (in/in) Discharge Instruction: Apply to wound as directed. Do not cut. Secured With Compression Wrap Compression Stockings Environmental education officer) Signed: 11/17/2020 12:17:32 PM By: Gretta Cool, BSN, RN, CWS, Kim RN, BSN Entered By: Gretta Cool, BSN, RN, CWS, Kim on 11/17/2020 08:22:49 Andrew [name] ([id number]) -------------------------------------------------------------------------------- Vitals Details Patient Name: [name], [name] Date of Service: 11/17/2020 8:00 AM Medical Record Number: [id number] Patient Account Number: [id number] Date of Birth/Sex: 06-15-1950 (70 y.o. M) Treating RN: Cornell Barman Primary Care Twylla Arceneaux: [name] Other Clinician: Referring Annell Canty: [name] Treating Josilyn Shippee/Extender: Jeri Cos Weeks in Treatment: 1 Vital Signs Time Taken: 08:19 Temperature (F): 98.4 Height (in): 73 Pulse (bpm): 65 Weight (lbs): 181 Respiratory Rate (breaths/min): 16 Body Mass Index (BMI): 23.9 Blood Pressure (mmHg): 149/77 Reference Range: 80 - 120 mg / dl Electronic Signature(s) Signed: 11/17/2020 12:17:32 PM By: Gretta Cool, BSN, RN, CWS, Kim RN, BSN Entered By: Gretta Cool, BSN, RN, CWS, Kim on 11/17/2020 [phone]

## 2020-11-18 ENCOUNTER — Encounter: Payer: PPO | Admitting: Speech Pathology

## 2020-11-20 ENCOUNTER — Other Ambulatory Visit: Payer: Self-pay

## 2020-11-20 ENCOUNTER — Ambulatory Visit: Payer: PPO | Admitting: Speech Pathology

## 2020-11-20 DIAGNOSIS — IMO0001 Reserved for inherently not codable concepts without codable children: Secondary | ICD-10-CM

## 2020-11-20 DIAGNOSIS — G3184 Mild cognitive impairment, so stated: Secondary | ICD-10-CM

## 2020-11-20 DIAGNOSIS — R41841 Cognitive communication deficit: Secondary | ICD-10-CM

## 2020-11-20 NOTE — Patient Instructions (Signed)
1.) identify place to keep important items (wallet, calendar, keys) 2.) identify project pt wishes to do home, list of steps to complete project as well as days to implement each step

## 2020-11-20 NOTE — Therapy (Signed)
[place] MAIN [facility] SERVICES [address] [place], [place], [address] Phone: [phone]   Fax:  [phone]  Speech Language Pathology Treatment  Patient Details  Name: Andrew [name] MRN: [id number] Date of Birth: 28-Apr-1950 Referring Provider (SLP): Jennings Books   Encounter Date: 11/20/2020   End of Session - 11/20/20 1045     Visit Number 12    Number of Visits 25    Date for SLP Re-Evaluation 01/02/21    Authorization Type [employer]    Authorization Time Period 10/08/2020 thru 01/12/2021    Authorization - Visit Number 2    Progress Note Due on Visit 10    SLP Start Time 0900    SLP Stop Time  1000    SLP Time Calculation (min) 60 min    Activity Tolerance Patient tolerated treatment well             Past Medical History:  Diagnosis Date   Depression    Diabetes mellitus without complication ([place])    Hyperlipidemia    Hypertension     Past Surgical History:  Procedure Laterality Date   COLONOSCOPY WITH PROPOFOL N/A 07/13/2019   Procedure: COLONOSCOPY WITH PROPOFOL;  Surgeon: [name], MD;  Location: [facility] ENDOSCOPY;  Service: Endoscopy;  Laterality: N/A;   NASAL SINUS SURGERY     REPAIR OF ACUTE ASCENDING THORACIC AORTIC DISSECTION  12/2018    There were no vitals filed for this visit.   Subjective Assessment - 11/20/20 1041     Subjective [name] wife accompanied pt, back in town from out of country trip    Patient is accompained by: Family member    Currently in Pain? No/denies                   ADULT SLP TREATMENT - 11/20/20 0001       Treatment Provided   Treatment provided Cognitive-Linquistic      Cognitive-Linquistic Treatment   Treatment focused on Cognition;Patient/family/caregiver education    Skilled Treatment COGNITION: 91% accuracy when problem solving semi-complex to complex problem solving task; much decreased impulsitivity at around <10 % of opportunities; idenfitied  barrier of disorganization of appt information, strategies created to increase use of calendar, planning pages and identified location to keep items at home              SLP Education - 11/20/20 1045     Education Details executive function, compensatory strategies to increase organization at home    Person(s) Educated Patient;Spouse    Methods Explanation;Demonstration;Verbal cues;Handout    Comprehension Verbalized understanding;Returned demonstration;Need further instruction              SLP Short Term Goals - 11/11/20 1644       SLP SHORT TERM GOAL #1   Title With minimal cues, pt will complete semi-complex reasoning task with 90% accuracy.    Time 10    Period --   sessions   Status On-going      SLP SHORT TERM GOAL #2   Title With minimal assistance, pt will complete tasks focusing on inferential reasoning with 80% accuracy.    Baseline goal met in time period, goal updated    Time 10    Period --   sessions   Status Revised      SLP SHORT TERM GOAL #3   Title Pt will demonstrate selective attention for 45 minutes in a moderately distracting environment.    Time 10  Period --   session   Status On-going      SLP SHORT TERM GOAL #4   Title With minimal assistance, pt will demonstrate anticipatory awareness by providing appropriate solution to hypothetical safety situation.    Baseline goal met in time period, goal revised    Time 10    Period --   sessions   Status New              SLP Long Term Goals - 11/11/20 1646       SLP LONG TERM GOAL #1   Title Pt will complete complex reasoning tasks with > 90% ability.    Status On-going    Target Date 01/02/21      SLP LONG TERM GOAL #2   Title Pt will demonstrate divided attention to task for 30 minute intervals.    Status On-going    Target Date 01/02/21      SLP LONG TERM GOAL #3   Title Pt and wife will identify cognitive communication barriers and create compensatory strategies to increase  pt's functional independence within family, leisure and community activities.    Status On-going    Target Date 01/02/21              Plan - 11/20/20 1045     Clinical Impression Statement Pt continues to show improved with executive functioning during previously introduced complex problem solving task. While pt brings his calendar to all appts, he neglected to write information regarding dentist appt in calendar and was unable to remember details about appt. Pt and his wife identified disorganization as a barrier to pt's functional independence during everyday tasks. Strategies created with SLP assistance and plan written down for pt to being implementing. Skilled ST continues to be indicated to target pt's mild cognitive deficits in an effort to increase pt's functional independence and reduce caregiver burden.    Speech Therapy Frequency 2x / week    Duration 12 weeks    Treatment/Interventions Patient/family education;Cognitive reorganization;Internal/external aids;SLP instruction and feedback;Compensatory strategies;Functional tasks    Potential to Achieve Goals Good    SLP Home Exercise Plan provided, see pt instruction section    Consulted and Agree with Plan of Care Patient;Family member/caregiver    Family Member Consulted pt's wife             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, sequela [facility])    Problem List Patient Active Problem List   Diagnosis Date Noted   Aortic atherosclerosis ([place]) 05/02/2020   Screening for colon cancer    Aortic dissection proximal to innominate ([place]) 12/23/2018   Mixed hyperlipidemia 10/05/2016   Familial multiple lipoprotein-type hyperlipidemia 08/02/2014   Routine general medical examination at a health care facility 08/02/2014   Recurrent major depressive  episodes ([place]) 08/02/2014   Essential (primary) hypertension 08/02/2014   Overweight 08/02/2014   Diabetes mellitus, type 2 ([place]) 08/02/2014   Kaegan Stigler B. Rutherford Nail M.S., CCC-SLP, [place] Pathologist Rehabilitation Services Office [phone]  Stormy Fabian 11/20/2020, 10:50 AM  [place] MAIN [facility] SERVICES [address] [place], [place], [address] Phone: [phone]   Fax:  [phone]   Name: Andrew [name] MRN: [id number] Date of Birth: 03-Nov-1950

## 2020-11-24 ENCOUNTER — Ambulatory Visit: Payer: PPO | Admitting: Physician Assistant

## 2020-11-24 ENCOUNTER — Encounter: Payer: PPO | Admitting: Speech Pathology

## 2020-11-25 ENCOUNTER — Encounter: Payer: PPO | Admitting: Speech Pathology

## 2020-11-25 ENCOUNTER — Ambulatory Visit: Payer: PPO | Admitting: Speech Pathology

## 2020-11-25 ENCOUNTER — Other Ambulatory Visit: Payer: Self-pay

## 2020-11-25 DIAGNOSIS — IMO0001 Reserved for inherently not codable concepts without codable children: Secondary | ICD-10-CM

## 2020-11-25 DIAGNOSIS — R41841 Cognitive communication deficit: Secondary | ICD-10-CM | POA: Diagnosis not present

## 2020-11-25 DIAGNOSIS — G3184 Mild cognitive impairment, so stated: Secondary | ICD-10-CM

## 2020-11-25 NOTE — Therapy (Signed)
[place] MAIN [facility] SERVICES [address] [place], [place], [address] Phone: [phone]   Fax:  [phone]  Speech Language Pathology Treatment  Patient Details  Name: Andrew [name] MRN: [id number] Date of Birth: Aug 05, 1950 Referring Provider (SLP): Jennings Books   Encounter Date: 11/25/2020   End of Session - 11/25/20 1231     Visit Number 13    Number of Visits 25    Date for SLP Re-Evaluation 01/02/21    Authorization Type [employer]    Authorization Time Period 10/08/2020 thru 01/12/2021    Authorization - Visit Number 3    Progress Note Due on Visit 10    SLP Start Time 0915    SLP Stop Time  1000    SLP Time Calculation (min) 45 min    Activity Tolerance Patient tolerated treatment well             Past Medical History:  Diagnosis Date   Depression    Diabetes mellitus without complication ([place])    Hyperlipidemia    Hypertension     Past Surgical History:  Procedure Laterality Date   COLONOSCOPY WITH PROPOFOL N/A 07/13/2019   Procedure: COLONOSCOPY WITH PROPOFOL;  Surgeon: [name], MD;  Location: [facility] ENDOSCOPY;  Service: Endoscopy;  Laterality: N/A;   NASAL SINUS SURGERY     REPAIR OF ACUTE ASCENDING THORACIC AORTIC DISSECTION  12/2018    There were no vitals filed for this visit.   Subjective Assessment - 11/25/20 1228     Subjective "I had a hard time getting started this morning"    Currently in Pain? No/denies                   ADULT SLP TREATMENT - 11/25/20 0001       Treatment Provided   Treatment provided Cognitive-Linquistic      Cognitive-Linquistic Treatment   Treatment focused on Cognition;Patient/family/caregiver education    Skilled Treatment COGNITION: Supervision faded to Modified Independent for verbal organization, sequential presentation when describing unknown word with restrains for description, minimal cues faded to supervision cues for impulsivity during  task              SLP Education - 11/25/20 1231     Education Details verbal organization    Person(s) Educated Patient    Methods Explanation;Demonstration;Verbal cues    Comprehension Returned demonstration;Verbalized understanding              SLP Short Term Goals - 11/11/20 1644       SLP SHORT TERM GOAL #1   Title With minimal cues, pt will complete semi-complex reasoning task with 90% accuracy.    Time 10    Period --   sessions   Status On-going      SLP SHORT TERM GOAL #2   Title With minimal assistance, pt will complete tasks focusing on inferential reasoning with 80% accuracy.    Baseline goal met in time period, goal updated    Time 10    Period --   sessions   Status Revised      SLP SHORT TERM GOAL #3   Title Pt will demonstrate selective attention for 45 minutes in a moderately distracting environment.    Time 10    Period --   session   Status On-going      SLP SHORT TERM GOAL #4   Title With minimal assistance, pt will demonstrate anticipatory awareness by providing appropriate solution to hypothetical  safety situation.    Baseline goal met in time period, goal revised    Time 10    Period --   sessions   Status New              SLP Long Term Goals - 11/11/20 1646       SLP LONG TERM GOAL #1   Title Pt will complete complex reasoning tasks with > 90% ability.    Status On-going    Target Date 01/02/21      SLP LONG TERM GOAL #2   Title Pt will demonstrate divided attention to task for 30 minute intervals.    Status On-going    Target Date 01/02/21      SLP LONG TERM GOAL #3   Title Pt and wife will identify cognitive communication barriers and create compensatory strategies to increase pt's functional independence within family, leisure and community activities.    Status On-going    Target Date 01/02/21              Plan - 11/25/20 1231     Clinical Impression Statement Pt presents with mild cognitive communication  deficits in the areas of impulsivity, executive function and higher levels of attention. While he is demonstrating improvements in each of these areas, he continues to require skilled ST intervention to improve his functional independence and reduce caregiver burden.    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, sequela [facility])    Problem List Patient Active Problem List   Diagnosis Date Noted   Aortic atherosclerosis ([place]) 05/02/2020   Screening for colon cancer    Aortic dissection proximal to innominate ([place]) 12/23/2018   Mixed hyperlipidemia 10/05/2016   Familial multiple lipoprotein-type hyperlipidemia 08/02/2014   Routine general medical examination at a health care facility 08/02/2014   Recurrent major depressive episodes ([place]) 08/02/2014   Essential (primary) hypertension 08/02/2014   Overweight 08/02/2014   Diabetes mellitus, type 2 ([place]) 08/02/2014   Catharina Pica B. Rutherford Nail M.S., CCC-SLP, [place] Pathologist Rehabilitation Services Office [phone]  Stormy Fabian 11/25/2020, 12:38 PM  [place] MAIN [facility] SERVICES [address] [place], [place], [address] Phone: [phone]   Fax:  [phone]   Name: Andrew [name] MRN: [id number] Date of Birth: 1950-09-24

## 2020-11-26 ENCOUNTER — Encounter (HOSPITAL_BASED_OUTPATIENT_CLINIC_OR_DEPARTMENT_OTHER): Payer: PPO | Admitting: Internal Medicine

## 2020-11-26 DIAGNOSIS — E11622 Type 2 diabetes mellitus with other skin ulcer: Secondary | ICD-10-CM | POA: Diagnosis not present

## 2020-11-26 DIAGNOSIS — S81811A Laceration without foreign body, right lower leg, initial encounter: Secondary | ICD-10-CM

## 2020-11-26 DIAGNOSIS — I10 Essential (primary) hypertension: Secondary | ICD-10-CM

## 2020-11-26 DIAGNOSIS — L97812 Non-pressure chronic ulcer of other part of right lower leg with fat layer exposed: Secondary | ICD-10-CM

## 2020-11-26 NOTE — Progress Notes (Signed)
KASRA, HELLBERG ([id number]) Visit Report for 11/26/2020 Chief Complaint Document Details Patient Name: Andrew [name], [name] Date of Service: 11/26/2020 8:00 AM Medical Record Number: [id number] Patient Account Number: [id number] Date of Birth/Sex: Mar 22, 1951 (70 y.o. M) Treating RN: Donnamarie Poag Primary Care Provider: [name] Other Clinician: Referring Provider: [name] Treating Provider/Extender: Yaakov Guthrie in Treatment: 2 Information Obtained from: Patient Chief Complaint Right leg ulcer Electronic Signature(s) Signed: 11/26/2020 8:27:13 AM By: Kalman Shan DO Entered By: Kalman Shan on 11/26/2020 08:23:27 Devol, Kendarrius LMarland Kitchen ([id number]) -------------------------------------------------------------------------------- HPI Details Patient Name: Andrew [name], [name] Date of Service: 11/26/2020 8:00 AM Medical Record Number: [id number] Patient Account Number: [id number] Date of Birth/Sex: Sep 26, 1950 (70 y.o. M) Treating RN: Donnamarie Poag Primary Care Provider: [name] Other Clinician: Referring Provider: [name] Treating Provider/Extender: Yaakov Guthrie in Treatment: 2 History of Present Illness HPI Description: 11/07/2020 upon evaluation today patient presents for initial evaluation here in clinic concerning a traumatic ulcer that he has on the lateral portion of his leg in the calf region on the right. With that being said he has been on Augmentin although I do not see any signs of active infection at this time which is great news. Fortunately there is no evidence of infection currently as well which is also great news. No fevers, chills, nausea, vomiting, or diarrhea. The patient's ABI today was 1.13. He also has a history of hypertension for which she is on medication otherwise no major medical problems that would affect his healing currently. This occurred as a result of him hitting this on the edge of a storm door. 11/17/2020 upon  evaluation today patient appears to be doing better in regard to his wound. With that being said this does still appear to be open but is not nearly the size that it was previous. I am very pleased actually with where things stand and I think that the patient is making excellent progress here. Still I think that there is a small opening but again I think the collagen can help he just needs to make sure to change this on a every other day basis. 8/31; patient presents for 1 week follow-up. He has been using collagen every other day to the wound bed. He has no issues or complaints today. He denies signs of infection. Electronic Signature(s) Signed: 11/26/2020 8:27:13 AM By: Kalman Shan DO Entered By: Kalman Shan on 11/26/2020 08:24:55 Harkleroad, Chares Carlean Jews ([id number]) -------------------------------------------------------------------------------- Physical Exam Details Patient Name: Andrew [name], [name] Date of Service: 11/26/2020 8:00 AM Medical Record Number: [id number] Patient Account Number: [id number] Date of Birth/Sex: Oct 10, 1950 (70 y.o. M) Treating RN: Donnamarie Poag Primary Care Provider: [name] Other Clinician: Referring Provider: [name] Treating Provider/Extender: Yaakov Guthrie in Treatment: 2 Constitutional . Psychiatric . Notes Right lower extremity: To the lateral aspect there is a small open wound with granulation tissue present. Upon palpation there is no induration or drainage noted. No increased warmth or erythema noted. Appears well-healing. Electronic Signature(s) Signed: 11/26/2020 8:27:13 AM By: Kalman Shan DO Entered By: Kalman Shan on 11/26/2020 08:25:36 Caselli, [name] ([id number]) -------------------------------------------------------------------------------- Physician Orders Details Patient Name: Andrew [name], [name] Date of Service: 11/26/2020 8:00 AM Medical Record Number: [id number] Patient Account Number: [id number] Date of  Birth/Sex: [birth year] (70 y.o. M) Treating RN: Donnamarie Poag Primary Care Provider: [name] Other Clinician: Referring Provider: [name] Treating Provider/Extender: Yaakov Guthrie in Treatment: 2 Verbal / Phone Orders: No Diagnosis Coding Follow-up Appointments o Return Appointment in 1 week. o  Nurse Visit as needed - call to schedule if needed Bathing/ Shower/ Hygiene o May shower; gently cleanse wound with antibacterial soap, rinse and pat dry prior to dressing wounds - make sure dressing is clean and dry-change after shower o No tub bath. Edema Control - Lymphedema / Segmental Compressive Device / Other o Elevate, Exercise Daily and Avoid Standing for Long Periods of Time. o Elevate leg(s) parallel to the floor when sitting. o DO YOUR BEST to sleep in the bed at night. DO NOT sleep in your recliner. Long hours of sitting in a recliner leads to swelling of the legs and/or potential wounds on your backside. Wound Treatment Wound #1 - Lower Leg Wound Laterality: Right, Lateral Cleanser: Soap and Water 3 x Per Day/30 Days Discharge Instructions: Gently cleanse wound with antibacterial soap, rinse and pat dry prior to dressing wounds Primary Dressing: Prisma 4.34 (in) 3 x Per Day/30 Days Discharge Instructions: Moisten w/normal saline or sterile water; Cover wound as directed. Do not remove from wound bed. Secondary Dressing: Mepilex Border Flex, 4x4 (in/in) (Generic) 3 x Per Day/30 Days Discharge Instructions: Apply to wound as directed. Do not cut. Electronic Signature(s) Signed: 11/26/2020 8:27:13 AM By: Kalman Shan DO Signed: 11/26/2020 3:14:25 PM By: Donnamarie Poag Entered By: Donnamarie Poag on 11/26/2020 08:22:01 Better, Tannen LMarland Kitchen ([id number]) -------------------------------------------------------------------------------- Problem List Details Patient Name: Andrew [name], [name] Date of Service: 11/26/2020 8:00 AM Medical Record Number:  [id number] Patient Account Number: [id number] Date of Birth/Sex: [birth year] (70 y.o. M) Treating RN: Donnamarie Poag Primary Care Provider: [name] Other Clinician: Referring Provider: [name] Treating Provider/Extender: Yaakov Guthrie in Treatment: 2 Active Problems ICD-10 Encounter Code Description Active Date MDM Diagnosis [phone] Laceration without foreign body, right lower leg, initial encounter 11/07/2020 No Yes L97.812 Non-pressure chronic ulcer of other part of right lower leg with fat layer 11/07/2020 No Yes exposed [place] (primary) hypertension 11/07/2020 No Yes Inactive Problems Resolved Problems Electronic Signature(s) Signed: 11/26/2020 8:27:13 AM By: Kalman Shan DO Entered By: Kalman Shan on 11/26/2020 08:23:16 Plouff, Vidur L. ([id number]) -------------------------------------------------------------------------------- Progress Note Details Patient Name: Andrew [name], [name] Date of Service: 11/26/2020 8:00 AM Medical Record Number: [id number] Patient Account Number: [id number] Date of Birth/Sex: 1950/04/27 (70 y.o. M) Treating RN: Donnamarie Poag Primary Care Provider: [name] Other Clinician: Referring Provider: [name] Treating Provider/Extender: Yaakov Guthrie in Treatment: 2 Subjective Chief Complaint Information obtained from Patient Right leg ulcer History of Present Illness (HPI) 11/07/2020 upon evaluation today patient presents for initial evaluation here in clinic concerning a traumatic ulcer that he has on the lateral portion of his leg in the calf region on the right. With that being said he has been on Augmentin although I do not see any signs of active infection at this time which is great news. Fortunately there is no evidence of infection currently as well which is also great news. No fevers, chills, nausea, vomiting, or diarrhea. The patient's ABI today was 1.13. He also has a history of hypertension for  which she is on medication otherwise no major medical problems that would affect his healing currently. This occurred as a result of him hitting this on the edge of a storm door. 11/17/2020 upon evaluation today patient appears to be doing better in regard to his wound. With that being said this does still appear to be open but is not nearly the size that it was previous. I am very pleased actually with where things stand and I think that the patient  is making excellent progress here. Still I think that there is a small opening but again I think the collagen can help he just needs to make sure to change this on a every other day basis. 8/31; patient presents for 1 week follow-up. He has been using collagen every other day to the wound bed. He has no issues or complaints today. He denies signs of infection. Patient History Information obtained from Patient. Social History Never smoker, Marital Status - Married, Alcohol Use - Rarely, Drug Use - No History, Caffeine Use - Daily. Medical History Cardiovascular Patient has history of Coronary Artery Disease, Hypertension Endocrine Patient has history of Type II Diabetes Objective Constitutional Vitals Time Taken: 8:08 AM, Height: 73 in, Weight: 181 lbs, BMI: 23.9, Temperature: 98.5 F, Pulse: 68 bpm, Respiratory Rate: 16 breaths/min, Blood Pressure: 150/89 mmHg. General Notes: Right lower extremity: To the lateral aspect there is a small open wound with granulation tissue present. Upon palpation there is no induration or drainage noted. No increased warmth or erythema noted. Appears well-healing. Integumentary (Hair, Skin) Wound #1 status is Open. Original cause of wound was Laceration. The date acquired was: 09/22/2020. The wound has been in treatment 2 weeks. The wound is located on the Right,Lateral Lower Leg. The wound measures 0.2cm length x 0.2cm width x 0.1cm depth; 0.031cm^2 area and 0.003cm^3 volume. There is no tunneling or undermining  noted. There is a none present amount of drainage noted. There is large (67- 100%) granulation within the wound bed. There is a small (1-33%) amount of necrotic tissue within the wound bed including [place]. Savary, Jakarius L. ([id number]) Assessment Active Problems ICD-10 Laceration without foreign body, right lower leg, initial encounter Non-pressure chronic ulcer of other part of right lower leg with fat layer exposed Essential (primary) hypertension Patient presents with a trauma wound that appears well-healing. It has improved in size and appearance since last clinic visit. No signs of infection. I recommended continuing current treatment of collagen and foam border dressing. Follow-up next week Plan Follow-up Appointments: Return Appointment in 1 week. Nurse Visit as needed - call to schedule if needed Bathing/ Shower/ Hygiene: May shower; gently cleanse wound with antibacterial soap, rinse and pat dry prior to dressing wounds - make sure dressing is clean and dry- change after shower No tub bath. Edema Control - Lymphedema / Segmental Compressive Device / Other: Elevate, Exercise Daily and Avoid Standing for Long Periods of Time. Elevate leg(s) parallel to the floor when sitting. DO YOUR BEST to sleep in the bed at night. DO NOT sleep in your recliner. Long hours of sitting in a recliner leads to swelling of the legs and/or potential wounds on your backside. WOUND #1: - Lower Leg Wound Laterality: Right, Lateral Cleanser: Soap and Water 3 x Per Day/30 Days Discharge Instructions: Gently cleanse wound with antibacterial soap, rinse and pat dry prior to dressing wounds Primary Dressing: Prisma 4.34 (in) 3 x Per Day/30 Days Discharge Instructions: Moisten w/normal saline or sterile water; Cover wound as directed. Do not remove from wound bed. Secondary Dressing: Mepilex Border Flex, 4x4 (in/in) (Generic) 3 x Per Day/30 Days Discharge Instructions: Apply to wound as directed.  Do not cut. 1. Collagen 2. Follow-up in 1 week Electronic Signature(s) Signed: 11/26/2020 8:27:13 AM By: Kalman Shan DO Entered By: Kalman Shan on 11/26/2020 08:26:23 Bozman, Pearson Carlean Jews ([id number]) -------------------------------------------------------------------------------- ROS/PFSH Details Patient Name: Andrew [name], [name] Date of Service: 11/26/2020 8:00 AM Medical Record Number: [id number] Patient Account Number: [id number] Date of Birth/Sex:  Jun 13, 1950 (70 y.o. M) Treating RN: Donnamarie Poag Primary Care Provider: [name] Other Clinician: Referring Provider: [name] Treating Provider/Extender: Yaakov Guthrie in Treatment: 2 Information Obtained From Patient Cardiovascular Medical History: Positive for: Coronary Artery Disease; Hypertension Endocrine Medical History: Positive for: Type II Diabetes Time with diabetes: 2012 Treated with: Oral agents, Diet Blood sugar tested every day: No Immunizations Pneumococcal Vaccine: Received Pneumococcal Vaccination: No Implantable Devices None Family and Social History Never smoker; Marital Status - Married; Alcohol Use: Rarely; Drug Use: No History; Caffeine Use: Daily; Financial Concerns: No; Food, Clothing or Shelter Needs: No; Support System Lacking: No; Transportation Concerns: No Electronic Signature(s) Signed: 11/26/2020 8:27:13 AM By: Kalman Shan DO Signed: 11/26/2020 3:14:25 PM By: Donnamarie Poag Entered By: Kalman Shan on 11/26/2020 08:25:01 Elsen, [place] ([id number]) -------------------------------------------------------------------------------- SuperBill Details Patient Name: Andrew [name], [name] Date of Service: 11/26/2020 Medical Record Number: [id number] Patient Account Number: [id number] Date of Birth/Sex: [birth year] (70 y.o. M) Treating RN: Donnamarie Poag Primary Care Provider: [name] Other Clinician: Referring Provider: [name] Treating Provider/Extender: Yaakov Guthrie in Treatment: 2 Diagnosis Coding ICD-10 Codes Code Description [phone] Laceration without foreign body, right lower leg, initial encounter L97.812 Non-pressure chronic ulcer of other part of right lower leg with fat layer exposed [place] (primary) hypertension Facility Procedures CPT4 Code: [id number] Description: [id number] - WOUND CARE VISIT-LEV 2 EST PT Modifier: Quantity: 1 Physician Procedures CPT4 Code: [id number] Description: [id number] - WC PHYS LEVEL 3 - EST PT Modifier: Quantity: 1 CPT4 Code: Description: ICD-10 Diagnosis Description [id number] Laceration without foreign body, right lower leg, initial encounter L97.812 Non-pressure chronic ulcer of other part of right lower leg with fat la I10 Essential (primary) hypertension Modifier: yer exposed Quantity: Electronic Signature(s) Signed: 11/26/2020 8:27:13 AM By: Kalman Shan DO Entered By: Kalman Shan on 11/26/2020 08:26:36

## 2020-11-26 NOTE — Progress Notes (Signed)
Andrew, [name] ([id number]) Visit Report for 11/26/2020 Arrival Information Details Patient Name: Andrew [name], Andrew [name]. Date of Service: 11/26/2020 8:00 AM Medical Record Number: [id number] Patient Account Number: [id number] Date of Birth/Sex: 03-16-1951 (70 y.o. M) Treating RN: Donnamarie Poag Primary Care Lovina Zuver: [name] Other Clinician: Referring Mackinley Cassaday: [name] Treating Ranisha Allaire/Extender: Yaakov Guthrie in Treatment: 2 Visit Information History Since Last Visit Added or deleted any medications: No Patient Arrived: Ambulatory Had a fall or experienced change in No Arrival Time: 08:05 activities of daily living that may affect Accompanied By: self risk of falls: Transfer Assistance: None Hospitalized since last visit: No Patient Has Alerts: Yes Has Dressing in Place as Prescribed: Yes Patient Alerts: Patient on Blood Thinner Pain Present Now: No Aspirin 81mg  Diabetic Electronic Signature(s) Signed: 11/26/2020 11:45:45 AM By: Gretta Cool, BSN, RN, CWS, Kim RN, BSN Entered By: Gretta Cool, BSN, RN, CWS, Kim on 11/26/2020 11:45:45 Miller, [name] ([id number]) -------------------------------------------------------------------------------- Clinic Level of Care Assessment Details Patient Name: Andrew, Juana L. Date of Service: 11/26/2020 8:00 AM Medical Record Number: [id number] Patient Account Number: [id number] Date of Birth/Sex: Jan 01, 1951 (70 y.o. M) Treating RN: Donnamarie Poag Primary Care Jamea Robicheaux: [name] Other Clinician: Referring Loukas Antonson: [name] Treating Quinlan Mcfall/Extender: Yaakov Guthrie in Treatment: 2 Clinic Level of Care Assessment Items TOOL 4 Quantity Score []  - Use when only an EandM is performed on FOLLOW-UP visit 0 ASSESSMENTS - Nursing Assessment / Reassessment []  - Reassessment of Co-morbidities (includes updates in patient status) 0 []  - 0 Reassessment of Adherence to Treatment Plan ASSESSMENTS - Wound and Skin Assessment /  Reassessment X - Simple Wound Assessment / Reassessment - one wound 1 5 []  - 0 Complex Wound Assessment / Reassessment - multiple wounds []  - 0 Dermatologic / Skin Assessment (not related to wound area) ASSESSMENTS - Focused Assessment []  - Circumferential Edema Measurements - multi extremities 0 []  - 0 Nutritional Assessment / Counseling / Intervention []  - 0 Lower Extremity Assessment (monofilament, tuning fork, pulses) []  - 0 Peripheral Arterial Disease Assessment (using hand held doppler) ASSESSMENTS - Ostomy and/or Continence Assessment and Care []  - Incontinence Assessment and Management 0 []  - 0 Ostomy Care Assessment and Management (repouching, etc.) PROCESS - Coordination of Care X - Simple Patient / Family Education for ongoing care 1 15 []  - 0 Complex (extensive) Patient / Family Education for ongoing care X- 1 10 Staff obtains Consents, Records, Test Results / Process Orders []  - 0 Staff telephones HHA, Nursing Homes / Clarify orders / etc []  - 0 Routine Transfer to another Facility (non-emergent condition) []  - 0 Routine Hospital Admission (non-emergent condition) []  - 0 New Admissions / Biomedical engineer / Ordering NPWT, Apligraf, etc. []  - 0 Emergency Hospital Admission (emergent condition) X- 1 10 Simple Discharge Coordination []  - 0 Complex (extensive) Discharge Coordination PROCESS - Special Needs []  - Pediatric / Minor Patient Management 0 []  - 0 Isolation Patient Management []  - 0 Hearing / Language / Visual special needs []  - 0 Assessment of Community assistance (transportation, D/C planning, etc.) []  - 0 Additional assistance / Altered mentation []  - 0 Support Surface(s) Assessment (bed, cushion, seat, etc.) INTERVENTIONS - Wound Cleansing / Measurement Mcnay, Wyett L. ([id number]) X- 1 5 Simple Wound Cleansing - one wound []  - 0 Complex Wound Cleansing - multiple wounds X- 1 5 Wound Imaging (photographs - any number of  wounds) []  - 0 Wound Tracing (instead of photographs) X- 1 5 Simple Wound Measurement - one wound []  - 0 Complex  Wound Measurement - multiple wounds INTERVENTIONS - Wound Dressings X - Small Wound Dressing one or multiple wounds 1 10 $Re'[]'ZAK$  - 0 Medium Wound Dressing one or multiple wounds $RemoveBeforeD'[]'jONgjVchRawwaS$  - 0 Large Wound Dressing one or multiple wounds X- 1 5 Application of Medications - topical $RemoveB'[]'wjtISHIq$  - 0 Application of Medications - injection INTERVENTIONS - Miscellaneous $RemoveBeforeD'[]'nRUlWWWFNvUkvn$  - External ear exam 0 $Remo'[]'wQXBT$  - 0 Specimen Collection (cultures, biopsies, blood, body fluids, etc.) $RemoveBefor'[]'sVPdanjlzabI$  - 0 Specimen(s) / Culture(s) sent or taken to Lab for analysis $RemoveBefo'[]'ESjqNZEqwyE$  - 0 Patient Transfer (multiple staff / Civil Service fast streamer / Similar devices) $RemoveBeforeDE'[]'wbVTRegNBWZsJPM$  - 0 Simple Staple / Suture removal (25 or less) $Remove'[]'kbKtEko$  - 0 Complex Staple / Suture removal (26 or more) $Remove'[]'GfJpxss$  - 0 Hypo / Hyperglycemic Management (close monitor of Blood Glucose) $RemoveBefore'[]'EklMHSiPlknJW$  - 0 Ankle / Brachial Index (ABI) - do not check if billed separately X- 1 5 Vital Signs Has the patient been seen at the hospital within the last three years: Yes Total Score: 75 Level Of Care: New/Established - Level 2 Electronic Signature(s) Signed: 11/26/2020 3:14:25 PM By: Donnamarie Poag Entered By: Donnamarie Poag on 11/26/2020 08:22:39 Petrea, Kasir L. ([id number]) -------------------------------------------------------------------------------- Encounter Discharge Information Details Patient Name: [name], [name] Date of Service: 11/26/2020 8:00 AM Medical Record Number: [id number] Patient Account Number: [id number] Date of Birth/Sex: 08-Nov-1950 (70 y.o. M) Treating RN: Donnamarie Poag Primary Care Gedeon Brandow: [name] Other Clinician: Referring Shaira Sova: [name] Treating Corina Stacy/Extender: Yaakov Guthrie in Treatment: 2 Encounter Discharge Information Items Discharge Condition: Stable Ambulatory Status: Ambulatory Discharge Destination: Home Transportation: Private Auto Accompanied By:  self Schedule Follow-up Appointment: Yes Clinical Summary of Care: Electronic Signature(s) Signed: 11/26/2020 3:14:25 PM By: Donnamarie Poag Entered By: Donnamarie Poag on 11/26/2020 08:31:02 [place], [place] ([id number]) -------------------------------------------------------------------------------- Lower Extremity Assessment Details Patient Name: Akhter, Kalob L. Date of Service: 11/26/2020 8:00 AM Medical Record Number: [id number] Patient Account Number: [id number] Date of Birth/Sex: March 11, 1951 (70 y.o. M) Treating RN: Donnamarie Poag Primary Care Yzabella Crunk: [name] Other Clinician: Referring Jeremy Mclamb: [name] Treating Lyrika Souders/Extender: Kalman Shan Weeks in Treatment: 2 Edema Assessment Assessed: [Left: No] [Right: Yes] [Left: Edema] [Right: :] Vascular Assessment Pulses: Dorsalis Pedis Palpable: [Right:Yes] Electronic Signature(s) Signed: 11/26/2020 3:14:25 PM By: Donnamarie Poag Entered By: Donnamarie Poag on 11/26/2020 08:15:15 Saiki, Brinley L. ([id number]) -------------------------------------------------------------------------------- Multi Wound Chart Details Patient Name: Hartley, Jylan L. Date of Service: 11/26/2020 8:00 AM Medical Record Number: [id number] Patient Account Number: [id number] Date of Birth/Sex: 13-Sep-1950 (70 y.o. M) Treating RN: Donnamarie Poag Primary Care Mikhai Bienvenue: [name] Other Clinician: Referring Monica Zahler: [name] Treating Knowledge Escandon/Extender: Yaakov Guthrie in Treatment: 2 Vital Signs Height(in): 73 Pulse(bpm): 64 Weight(lbs): 181 Blood Pressure(mmHg): 150/89 Body Mass Index(BMI): 24 Temperature(F): 98.5 Respiratory Rate(breaths/min): 16 Photos: [N/A:N/A] Wound Location: Right, Lateral Lower Leg N/A N/A Wounding Event: Laceration N/A N/A Primary Etiology: Trauma, Other N/A N/A Comorbid History: Coronary Artery Disease, N/A N/A Hypertension, Type II Diabetes Date Acquired: 09/22/2020 N/A N/A Weeks of Treatment: 2 N/A  N/A Wound Status: Open N/A N/A Measurements L x W x D (cm) 0.2x0.2x0.1 N/A N/A Area (cm) : 0.031 N/A N/A Volume (cm) : 0.003 N/A N/A % Reduction in Area: 67.00% N/A N/A % Reduction in Volume: 66.70% N/A N/A Classification: Full Thickness Without Exposed N/A N/A Support Structures Exudate Amount: None Present N/A N/A Granulation Amount: Large (67-100%) N/A N/A Necrotic Amount: Small (1-33%) N/A N/A Epithelialization: None N/A N/A Treatment Notes Electronic Signature(s) Signed: 11/26/2020 8:27:13 AM By: Kalman Shan DO Entered By: Kalman Shan  on 11/26/2020 08:23:20 ROSHON, DUELL ([id number]) -------------------------------------------------------------------------------- [place] Details Patient Name: Notte, Bettie L. Date of Service: 11/26/2020 8:00 AM Medical Record Number: [id number] Patient Account Number: [id number] Date of Birth/Sex: [birth year] (70 y.o. M) Treating RN: Donnamarie Poag Primary Care Skylen Danielsen: [name] Other Clinician: Referring Rigley Niess: [name] Treating Madalynn Pickelsimer/Extender: Yaakov Guthrie in Treatment: 2 Active Inactive Wound/Skin Impairment Nursing Diagnoses: Impaired tissue integrity Knowledge deficit related to smoking impact on wound healing Knowledge deficit related to ulceration/compromised skin integrity Goals: Patient/caregiver will verbalize understanding of skin care regimen Date Initiated: 11/07/2020 Date Inactivated: 11/26/2020 Target Resolution Date: 11/26/2020 Goal Status: Met Ulcer/skin breakdown will have a volume reduction of 30% by week 4 Date Initiated: 11/07/2020 Target Resolution Date: 12/08/2020 Goal Status: Active Ulcer/skin breakdown will have a volume reduction of 50% by week 8 Date Initiated: 11/07/2020 Target Resolution Date: 01/07/2021 Goal Status: Active Ulcer/skin breakdown will have a volume reduction of 80% by week 12 Date Initiated: 11/07/2020 Target Resolution Date:  02/07/2021 Goal Status: Active Interventions: Assess patient/caregiver ability to obtain necessary supplies Assess patient/caregiver ability to perform ulcer/skin care regimen upon admission and as needed Assess ulceration(s) every visit Notes: Electronic Signature(s) Signed: 11/26/2020 3:14:25 PM By: Donnamarie Poag Entered By: Donnamarie Poag on 11/26/2020 08:15:51 Boran, Tayton L. ([id number]) -------------------------------------------------------------------------------- Pain Assessment Details Patient Name: Heintzelman, Mehtab L. Date of Service: 11/26/2020 8:00 AM Medical Record Number: [id number] Patient Account Number: [id number] Date of Birth/Sex: 1951-03-20 (70 y.o. M) Treating RN: Donnamarie Poag Primary Care Polly Barner: [name] Other Clinician: Referring Nastashia Gallo: [name] Treating Rhodes Calvert/Extender: Yaakov Guthrie in Treatment: 2 Active Problems Location of Pain Severity and Description of Pain Patient Has Paino No Site Locations Rate the pain. Current Pain Level: 0 Pain Management and Medication Current Pain Management: Electronic Signature(s) Signed: 11/26/2020 3:14:25 PM By: Donnamarie Poag Entered By: Donnamarie Poag on 11/26/2020 08:09:53 Melwood, Zavalla ([id number]) -------------------------------------------------------------------------------- Patient/Caregiver Education Details Patient Name: How, Korvin L. Date of Service: 11/26/2020 8:00 AM Medical Record Number: [id number] Patient Account Number: [id number] Date of Birth/Gender: 06/30/1950 (70 y.o. M) Treating RN: Donnamarie Poag Primary Care Physician: [name] Other Clinician: Referring Physician: [name] Treating Physician/Extender: Yaakov Guthrie in Treatment: 2 Education Assessment Education Provided To: Patient Education Topics Provided Basic Hygiene: Wound/Skin Impairment: Electronic Signature(s) Signed: 11/26/2020 3:14:25 PM By: Donnamarie Poag Entered By: Donnamarie Poag on 11/26/2020  08:20:58 Diggs, Stein LMarland Kitchen ([id number]) -------------------------------------------------------------------------------- Wound Assessment Details Patient Name: Pizzi, Ronnell L. Date of Service: 11/26/2020 8:00 AM Medical Record Number: [id number] Patient Account Number: [id number] Date of Birth/Sex: [birth year] (70 y.o. M) Treating RN: Donnamarie Poag Primary Care Maze Corniel: [name] Other Clinician: Referring Jemuel Laursen: [name] Treating Laquenta Whitsell/Extender: Yaakov Guthrie in Treatment: 2 Wound Status Wound Number: 1 Primary Etiology: Trauma, Other Wound Location: Right, Lateral Lower Leg Wound Status: Open Wounding Event: Laceration Comorbid Coronary Artery Disease, Hypertension, Type II History: Diabetes Date Acquired: 09/22/2020 Weeks Of Treatment: 2 Clustered Wound: No Photos Wound Measurements Length: (cm) 0.2 Width: (cm) 0.2 Depth: (cm) 0.1 Area: (cm) 0.031 Volume: (cm) 0.003 % Reduction in Area: 67% % Reduction in Volume: 66.7% Epithelialization: None Tunneling: No Undermining: No Wound Description Classification: Full Thickness Without Exposed Support Structures Exudate Amount: None Present Foul Odor After Cleansing: No Slough/Fibrino No Wound Bed Granulation Amount: Large (67-100%) Necrotic Amount: Small (1-33%) Necrotic Quality: Adherent Slough Treatment Notes Wound #1 (Lower Leg) Wound Laterality: Right, Lateral Cleanser Soap and Water Discharge Instruction: Gently cleanse wound with antibacterial soap, rinse and pat dry prior to  dressing wounds Peri-Wound Care Topical Primary Dressing Prisma 4.34 (in) Discharge Instruction: Moisten w/normal saline or sterile water; Cover wound as directed. Do not remove from wound bed. [place], Luman L. ([id number]) Secondary Dressing Mepilex Border Flex, 4x4 (in/in) Discharge Instruction: Apply to wound as directed. Do not cut. Secured With Compression Wrap Compression Stockings Add-Ons Electronic  Signature(s) Signed: 11/26/2020 3:14:25 PM By: Donnamarie Poag Entered By: Donnamarie Poag on 11/26/2020 08:14:56 Orchard, Dickinson ([id number]) -------------------------------------------------------------------------------- Vitals Details Patient Name: Kloosterman, Clearnce L. Date of Service: 11/26/2020 8:00 AM Medical Record Number: [id number] Patient Account Number: [id number] Date of Birth/Sex: 1950/10/31 (70 y.o. M) Treating RN: Donnamarie Poag Primary Care Kaylise Blakeley: [name] Other Clinician: Referring Leanord Thibeau: [name] Treating Kalyb Pemble/Extender: Yaakov Guthrie in Treatment: 2 Vital Signs Time Taken: 08:08 Temperature (F): 98.5 Height (in): 73 Pulse (bpm): 68 Weight (lbs): 181 Respiratory Rate (breaths/min): 16 Body Mass Index (BMI): 23.9 Blood Pressure (mmHg): 150/89 Reference Range: 80 - 120 mg / dl Electronic Signature(s) Signed: 11/26/2020 3:14:25 PM By: Donnamarie Poag Entered ByDonnamarie Poag on 11/26/2020 08:09:32

## 2020-11-27 ENCOUNTER — Ambulatory Visit: Payer: PPO | Attending: Neurology | Admitting: Speech Pathology

## 2020-11-27 ENCOUNTER — Other Ambulatory Visit: Payer: Self-pay

## 2020-11-27 DIAGNOSIS — IMO0001 Reserved for inherently not codable concepts without codable children: Secondary | ICD-10-CM

## 2020-11-27 DIAGNOSIS — G3184 Mild cognitive impairment, so stated: Secondary | ICD-10-CM | POA: Diagnosis not present

## 2020-11-27 DIAGNOSIS — S069X9S Unspecified intracranial injury with loss of consciousness of unspecified duration, sequela: Secondary | ICD-10-CM | POA: Insufficient documentation

## 2020-11-27 DIAGNOSIS — R41841 Cognitive communication deficit: Secondary | ICD-10-CM | POA: Insufficient documentation

## 2020-11-27 NOTE — Therapy (Signed)
[place] MAIN [facility] SERVICES [address] [place], [place], [address] Phone: [phone]   Fax:  [phone]  Speech Language Pathology Treatment  Patient Details  Name: Andrew [name] MRN: [id number] Date of Birth: 1951/03/26 Referring Provider (SLP): Jennings Books   Encounter Date: 11/27/2020   End of Session - 11/27/20 1228     Visit Number 14    Number of Visits 25    Date for SLP Re-Evaluation 01/02/21    Authorization Type [employer]    Authorization Time Period 10/08/2020 thru 01/12/2021    Authorization - Visit Number 4    Progress Note Due on Visit 10    SLP Start Time 0900    SLP Stop Time  1000    SLP Time Calculation (min) 60 min    Activity Tolerance Patient tolerated treatment well             Past Medical History:  Diagnosis Date   Depression    Diabetes mellitus without complication ([place])    Hyperlipidemia    Hypertension     Past Surgical History:  Procedure Laterality Date   COLONOSCOPY WITH PROPOFOL N/A 07/13/2019   Procedure: COLONOSCOPY WITH PROPOFOL;  Surgeon: [name], MD;  Location: [facility] ENDOSCOPY;  Service: Endoscopy;  Laterality: N/A;   NASAL SINUS SURGERY     REPAIR OF ACUTE ASCENDING THORACIC AORTIC DISSECTION  12/2018    There were no vitals filed for this visit.   Subjective Assessment - 11/27/20 1227     Subjective "Izora Gala had to work today"    Currently in Pain? No/denies                   ADULT SLP TREATMENT - 11/27/20 0001       Treatment Provided   Treatment provided Cognitive-Linquistic      Cognitive-Linquistic Treatment   Treatment focused on Cognition;Patient/family/caregiver education    Skilled Treatment COGNITION: independent for selective attention, independent with monitoring impulsivity, moderate faded to minimal cues for reasoning during complex grid perplexors tasks (pages 14-16)              SLP Education - 11/27/20 1227     Education  Details executive functions    Person(s) Educated Patient    Methods Explanation;Demonstration;Verbal cues    Comprehension Verbalized understanding;Returned demonstration;Need further instruction              SLP Short Term Goals - 11/11/20 1644       SLP SHORT TERM GOAL #1   Title With minimal cues, pt will complete semi-complex reasoning task with 90% accuracy.    Time 10    Period --   sessions   Status On-going      SLP SHORT TERM GOAL #2   Title With minimal assistance, pt will complete tasks focusing on inferential reasoning with 80% accuracy.    Baseline goal met in time period, goal updated    Time 10    Period --   sessions   Status Revised      SLP SHORT TERM GOAL #3   Title Pt will demonstrate selective attention for 45 minutes in a moderately distracting environment.    Time 10    Period --   session   Status On-going      SLP SHORT TERM GOAL #4   Title With minimal assistance, pt will demonstrate anticipatory awareness by providing appropriate solution to hypothetical safety situation.    Baseline goal met  in time period, goal revised    Time 10    Period --   sessions   Status New              SLP Long Term Goals - 11/11/20 1646       SLP LONG TERM GOAL #1   Title Pt will complete complex reasoning tasks with > 90% ability.    Status On-going    Target Date 01/02/21      SLP LONG TERM GOAL #2   Title Pt will demonstrate divided attention to task for 30 minute intervals.    Status On-going    Target Date 01/02/21      SLP LONG TERM GOAL #3   Title Pt and wife will identify cognitive communication barriers and create compensatory strategies to increase pt's functional independence within family, leisure and community activities.    Status On-going    Target Date 01/02/21              Plan - 11/27/20 1228     Clinical Impression Statement Pt reports that he is organizing his garage which helps him have a sense of direction to his day  as well as accomplishment. He is also much less impulsive during therapy tasks and has less off-topic comments. He has entered upcoming therapy sessions into his calendar and states that he continues to place his calendar in same place and takes it with him. While pt has made progress towards executive functions, he continues to require skilled ST services to target pt's cognitive deficits in an effort to increase pt's functional independence for return to daily tasks, leisure and community activities.    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, sequela [facility])    Problem List Patient Active Problem List   Diagnosis Date Noted   Aortic atherosclerosis ([place]) 05/02/2020   Screening for colon cancer    Aortic dissection proximal to innominate ([place]) 12/23/2018   Mixed hyperlipidemia 10/05/2016   Familial multiple lipoprotein-type hyperlipidemia 08/02/2014   Routine general medical examination at a health care facility 08/02/2014   Recurrent major depressive episodes ([place]) 08/02/2014   Essential (primary) hypertension 08/02/2014   Overweight 08/02/2014   Diabetes mellitus, type 2 ([place]) 08/02/2014   Jamirra Curnow B. Rutherford Nail M.S., CCC-SLP, [place] Pathologist Rehabilitation Services Office [phone]  Stormy Fabian 11/27/2020, 12:29 PM  [place] MAIN [facility] SERVICES [address]  [place], [place], [address] Phone: [phone]   Fax:  [phone]   Name: Andrew [name] MRN: [id number] Date of Birth: [birth year]

## 2020-12-02 ENCOUNTER — Encounter: Payer: PPO | Admitting: Speech Pathology

## 2020-12-02 ENCOUNTER — Ambulatory Visit: Payer: PPO | Admitting: Speech Pathology

## 2020-12-02 ENCOUNTER — Other Ambulatory Visit: Payer: Self-pay

## 2020-12-02 DIAGNOSIS — G3184 Mild cognitive impairment, so stated: Secondary | ICD-10-CM

## 2020-12-02 DIAGNOSIS — R41841 Cognitive communication deficit: Secondary | ICD-10-CM

## 2020-12-02 DIAGNOSIS — IMO0001 Reserved for inherently not codable concepts without codable children: Secondary | ICD-10-CM

## 2020-12-02 NOTE — Therapy (Signed)
[place] MAIN [facility] SERVICES [address] [place], [place], [address] Phone: [phone]   Fax:  [phone]  Speech Language Pathology Treatment  Patient Details  Name: Andrew [name] MRN: [id number] Date of Birth: 1951/03/07 Referring Provider (SLP): Jennings Books   Encounter Date: 12/02/2020   End of Session - 12/02/20 0949     Visit Number 15    Number of Visits 25    Date for SLP Re-Evaluation 01/02/21    Authorization Type [employer]    Authorization Time Period 10/08/2020 thru 01/12/2021    Authorization - Visit Number 5    Progress Note Due on Visit 10    SLP Start Time 0900    SLP Stop Time  1000    SLP Time Calculation (min) 60 min    Activity Tolerance Patient tolerated treatment well             Past Medical History:  Diagnosis Date   Depression    Diabetes mellitus without complication ([place])    Hyperlipidemia    Hypertension     Past Surgical History:  Procedure Laterality Date   COLONOSCOPY WITH PROPOFOL N/A 07/13/2019   Procedure: COLONOSCOPY WITH PROPOFOL;  Surgeon: [name], MD;  [facility]: [facility] END[facility]COPY;  Service: Endoscopy;  Laterality: N/A;   NASAL SINUS SURGERY     REPAIR OF ACUTE ASCENDING THORACIC AORTIC DISSECTION  12/2018    There were no vitals filed for this visit.   Subjective Assessment - 12/02/20 0949     Subjective "How was the beach?"    Currently in Pain? No/denies                   ADULT SLP TREATMENT - 12/02/20 0001       Treatment Provided   Treatment provided Cognitive-Linquistic      Cognitive-Linquistic Treatment   Treatment focused on Cognition;Patient/family/caregiver education    Skilled Treatment COGNITION: Utilizing timer for specified times to alternate between activities, pt independent with accessing and setting time intervals, Maximal cues to deduce vertically and horizontally when completing grid perplexors, independent with selective  attention to task for 45 minutes, no impulsivity noted throughout semi-complex alike/different task on iPad              SLP Education - 12/02/20 0950     Education Details Use of external compensatory strategies for time management    Person(s) Educated Patient    Methods Explanation;Demonstration;Verbal cues    Comprehension Verbalized understanding;Returned demonstration              SLP Short Term Goals - 11/11/20 1644       SLP SHORT TERM GOAL #1   Title With minimal cues, pt will complete semi-complex reasoning task with 90% accuracy.    Time 10    Period --   sessions   Status On-going      SLP SHORT TERM GOAL #2   Title With minimal assistance, pt will complete tasks focusing on inferential reasoning with 80% accuracy.    Baseline goal met in time period, goal updated    Time 10    Period --   sessions   Status Revised      SLP SHORT TERM GOAL #3   Title Pt will demonstrate selective attention for 45 minutes in a moderately distracting environment.    Time 10    Period --   session   Status On-going      SLP SHORT TERM  GOAL #4   Title With minimal assistance, pt will demonstrate anticipatory awareness by providing appropriate solution to hypothetical safety situation.    Baseline goal met in time period, goal revised    Time 10    Period --   sessions   Status New              SLP Long Term Goals - 11/11/20 1646       SLP LONG TERM GOAL #1   Title Pt will complete complex reasoning tasks with > 90% ability.    Status On-going    Target Date 01/02/21      SLP LONG TERM GOAL #2   Title Pt will demonstrate divided attention to task for 30 minute intervals.    Status On-going    Target Date 01/02/21      SLP LONG TERM GOAL #3   Title Pt and wife will identify cognitive communication barriers and create compensatory strategies to increase pt's functional independence within family, leisure and community activities.    Status On-going     Target Date 01/02/21              Plan - 12/02/20 1630     Clinical Impression Statement Pt reports that he used the timer on his cell phone to cook hamburgers yesterday. SLP shared additional ways to utilize timer and alarm on his phone. While pt continues to make progress towards his goals, he still continues to rely on his memory vs insight into need for using compensatory memory strategies. For example, he continued to require cues to cancel items vertically and horizontally but even with cues to write this reminder down, pt choose not to which hindered his functional independence with task. He also offers alternate reasons as to why he is not able to remember the above concept. By doing so, he demonstrates decreased insight into memory deficits and need for compensatory memory strategies. As such skilled ST intervention continues to be required to address the above mentioned deficits and improve pt's functional independence.             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, sequela [facility])    Problem List Patient Active Problem List   Diagnosis Date Noted   Aortic atherosclerosis ([place]) 05/02/2020   Screening for colon cancer    Aortic dissection proximal to innominate ([place]) 12/23/2018   Mixed hyperlipidemia 10/05/2016   Familial multiple lipoprotein-type hyperlipidemia 08/02/2014   Routine general medical examination at a health care facility 08/02/2014   Recurrent major depressive episodes ([place]) 08/02/2014   Essential (primary) hypertension 08/02/2014   Overweight 08/02/2014   Diabetes mellitus, type 2 ([place]) 08/02/2014   Gyasi Hazzard B. Rutherford Nail M.S., CCC-SLP, [place] Pathologist Rehabilitation Services Office [phone]  Stormy Fabian 12/02/2020, 4:30 PM  [place] MAIN [facility] SERVICES [address] [place], [place], [address] Phone: [phone]   Fax:  [phone]   Name: Andrew [name] MRN: [id number] Date of Birth: Sep 17, 1950

## 2020-12-02 NOTE — Patient Instructions (Signed)
Use timer/alarm to set reminders for at least 2 tasks

## 2020-12-03 ENCOUNTER — Ambulatory Visit: Payer: PPO | Admitting: Internal Medicine

## 2020-12-04 ENCOUNTER — Ambulatory Visit: Payer: PPO | Admitting: Speech Pathology

## 2020-12-04 ENCOUNTER — Other Ambulatory Visit: Payer: Self-pay

## 2020-12-04 DIAGNOSIS — IMO0001 Reserved for inherently not codable concepts without codable children: Secondary | ICD-10-CM

## 2020-12-04 DIAGNOSIS — R41841 Cognitive communication deficit: Secondary | ICD-10-CM

## 2020-12-04 DIAGNOSIS — G3184 Mild cognitive impairment, so stated: Secondary | ICD-10-CM

## 2020-12-05 NOTE — Therapy (Signed)
[place] MAIN [facility] SERVICES [address] [place], [place], [address] Phone: [phone]   Fax:  [phone]  Speech Language Pathology Treatment  Patient Details  Name: Andrew [name] MRN: [id number] Date of Birth: [birth year] Referring Provider (SLP): Andrew [name]   Encounter Date: 12/04/2020   End of Session - 12/05/20 1037     Visit Number 16    Number of Visits 25    Date for SLP Re-Evaluation 01/02/21    Authorization Type [employer]    Authorization Time Period 10/08/2020 thru 01/12/2021    Authorization - Visit Number 6    Progress Note Due on Visit 10    SLP Start Time 0900    SLP Stop Time  1000    SLP Time Calculation (min) 60 min    Activity Tolerance Patient tolerated treatment well             Past Medical History:  Diagnosis Date   Depression    Diabetes mellitus without complication ([place])    Hyperlipidemia    Hypertension     Past Surgical History:  Procedure Laterality Date   COLONOSCOPY WITH PROPOFOL N/A 07/13/2019   Procedure: COLONOSCOPY WITH PROPOFOL;  Surgeon: [name], MD;  Location: [facility] ENDOSCOPY;  Service: Endoscopy;  Laterality: N/A;   NASAL SINUS SURGERY     REPAIR OF ACUTE ASCENDING THORACIC AORTIC DISSECTION  12/2018    There were no vitals filed for this visit.   Subjective Assessment - 12/05/20 1035     Subjective "Can we switch an appt?"    Patient is accompained by: Family member    Currently in Pain? No/denies                   ADULT SLP TREATMENT - 12/05/20 0001       Treatment Provided   Treatment provided Cognitive-Linquistic      Cognitive-Linquistic Treatment   Treatment focused on Cognition;Patient/family/caregiver education    Skilled Treatment COGNITION: Utilizing timer for specified times to alternate between activities, pt independent with accessing and setting time intervals, Maximal cues to deduce vertically and horizontally when completing  grid perplexors, independent with alternating attention between 2 tasks for 45 minutes, no impulsivity noted throughout semi-complex alike/different task on iPad              SLP Education - 12/05/20 1037     Education Details reducing distractions    Person(s) Educated Patient;Spouse    Methods Explanation;Demonstration;Verbal cues    Comprehension Verbalized understanding;Returned demonstration;Need further instruction              SLP Short Term Goals - 11/11/20 1644       SLP SHORT TERM GOAL #1   Title With minimal cues, pt will complete semi-complex reasoning task with 90% accuracy.    Time 10    Period --   sessions   Status On-going      SLP SHORT TERM GOAL #2   Title With minimal assistance, pt will complete tasks focusing on inferential reasoning with 80% accuracy.    Baseline goal met in time period, goal updated    Time 10    Period --   sessions   Status Revised      SLP SHORT TERM GOAL #3   Title Pt will demonstrate selective attention for 45 minutes in a moderately distracting environment.    Time 10    Period --   session   Status On-going  SLP SHORT TERM GOAL #4   Title With minimal assistance, pt will demonstrate anticipatory awareness by providing appropriate solution to hypothetical safety situation.    Baseline goal met in time period, goal revised    Time 10    Period --   sessions   Status New              SLP Long Term Goals - 11/11/20 1646       SLP LONG TERM GOAL #1   Title Pt will complete complex reasoning tasks with > 90% ability.    Status On-going    Target Date 01/02/21      SLP LONG TERM GOAL #2   Title Pt will demonstrate divided attention to task for 30 minute intervals.    Status On-going    Target Date 01/02/21      SLP LONG TERM GOAL #3   Title Pt and wife will identify cognitive communication barriers and create compensatory strategies to increase pt's functional independence within family, leisure and  community activities.    Status On-going    Target Date 01/02/21              Plan - 12/05/20 1038     Clinical Impression Statement Pt presents with mild cognitive impairments in the areas of memory, emergent awareness, semi-complex problem solving, insight overall executive function. Subjectively pt and his wife report that pt has isolated himself since [facility] and he is frequently distracted at home. Education provided to pt and his wife to decrease distractions such as turning off the TV. Continue to recommend skilled ST services to target pt's cognitive deficits in an effort to increase pt's functional independence for return to leisure and community activities.    Speech Therapy Frequency 2x / week    Duration 12 weeks    Treatment/Interventions Patient/family education;Cognitive reorganization;Internal/external aids;SLP instruction and feedback;Compensatory strategies;Functional tasks    Potential to Achieve Goals Good    SLP Home Exercise Plan provided, see pt instruction section    Consulted and Agree with Plan of Care Patient;Family member/caregiver    Family Member Consulted pt's wife             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, sequela [facility])    Problem List Patient Active Problem List   Diagnosis Date Noted   Aortic atherosclerosis ([place]) 05/02/2020   Screening for colon cancer    Aortic dissection proximal to innominate ([place]) 12/23/2018   Mixed hyperlipidemia 10/05/2016   Familial multiple lipoprotein-type hyperlipidemia 08/02/2014   Routine general medical examination at a health care facility 08/02/2014   Recurrent major depressive episodes ([place]) 08/02/2014   Essential (primary) hypertension 08/02/2014   Overweight 08/02/2014   Diabetes mellitus, type 2 ([place]) 08/02/2014   Oanh Devivo B.  Rutherford Nail M.S., CCC-SLP, [place] Pathologist Rehabilitation Services Office [phone]  Stormy Fabian 12/05/2020, 10:41 AM  [place] MAIN [facility] SERVICES [address] [place], [place], [address] Phone: [phone]   Fax:  [phone]   Name: Andrew [name] MRN: [id number] Date of Birth: 10/08/1950

## 2020-12-05 NOTE — Patient Instructions (Signed)
Use timer on cell phone to notify him of important information Reduce distractions during conversation

## 2020-12-08 ENCOUNTER — Ambulatory Visit: Payer: PPO | Admitting: Physician Assistant

## 2020-12-09 ENCOUNTER — Encounter: Payer: PPO | Admitting: Speech Pathology

## 2020-12-09 ENCOUNTER — Encounter: Payer: PPO | Attending: Physician Assistant | Admitting: Physician Assistant

## 2020-12-09 ENCOUNTER — Other Ambulatory Visit: Payer: Self-pay

## 2020-12-09 DIAGNOSIS — W2209XA Striking against other stationary object, initial encounter: Secondary | ICD-10-CM | POA: Insufficient documentation

## 2020-12-09 DIAGNOSIS — I10 Essential (primary) hypertension: Secondary | ICD-10-CM | POA: Diagnosis not present

## 2020-12-09 DIAGNOSIS — E11622 Type 2 diabetes mellitus with other skin ulcer: Secondary | ICD-10-CM | POA: Insufficient documentation

## 2020-12-09 DIAGNOSIS — S81811A Laceration without foreign body, right lower leg, initial encounter: Secondary | ICD-10-CM | POA: Diagnosis not present

## 2020-12-09 DIAGNOSIS — L97812 Non-pressure chronic ulcer of other part of right lower leg with fat layer exposed: Secondary | ICD-10-CM | POA: Diagnosis not present

## 2020-12-09 NOTE — Progress Notes (Signed)
TREYSON, WALEK ([id number]) Visit Report for 12/09/2020 Arrival Information Details Patient Name: Andrew [name], Andrew [name]. Date of Service: 12/09/2020 2:00 PM Medical Record Number: [id number] Patient Account Number: [id number] Date of Birth/Sex: Oct 13, 1950 (70 y.o. M) Treating RN: Dolan Amen Primary Care Danisha Brassfield: [name] Other Clinician: Referring Marjarie Irion: [name] Treating Boris Engelmann/Extender: Skipper Cliche in Treatment: 4 Visit Information History Since Last Visit Pain Present Now: No Patient Arrived: Ambulatory Arrival Time: 14:19 Accompanied By: self Transfer Assistance: None Patient Identification Verified: Yes Secondary Verification Process Completed: Yes Patient Has Alerts: Yes Patient Alerts: Patient on Blood Thinner Aspirin '81mg'$  Diabetic Electronic Signature(s) Signed: 12/09/2020 5:00:35 PM By: Dolan Amen RN Entered By: Dolan Amen on 12/09/2020 14:20:02 [place], [place]. ([id number]) -------------------------------------------------------------------------------- Clinic Level of Care Assessment Details Patient Name: Neace, Odell L. Date of Service: 12/09/2020 2:00 PM Medical Record Number: [id number] Patient Account Number: [id number] Date of Birth/Sex: [birth year] (70 y.o. M) Treating RN: Dolan Amen Primary Care Yoseph Haile: [name] Other Clinician: Referring Deniz Eskridge: [name] Treating Lynnwood Beckford/Extender: Skipper Cliche in Treatment: 4 Clinic Level of Care Assessment Items TOOL 4 Quantity Score X - Use when only an EandM is performed on FOLLOW-UP visit 1 0 ASSESSMENTS - Nursing Assessment / Reassessment X - Reassessment of Co-morbidities (includes updates in patient status) 1 10 X- 1 5 Reassessment of Adherence to Treatment Plan ASSESSMENTS - Wound and Skin Assessment / Reassessment '[]'$  - Simple Wound Assessment / Reassessment - one wound 0 '[]'$  - 0 Complex Wound Assessment / Reassessment - multiple wounds '[]'$  - 0 Dermatologic  / Skin Assessment (not related to wound area) ASSESSMENTS - Focused Assessment '[]'$  - Circumferential Edema Measurements - multi extremities 0 '[]'$  - 0 Nutritional Assessment / Counseling / Intervention '[]'$  - 0 Lower Extremity Assessment (monofilament, tuning fork, pulses) '[]'$  - 0 Peripheral Arterial Disease Assessment (using hand held doppler) ASSESSMENTS - Ostomy and/or Continence Assessment and Care '[]'$  - Incontinence Assessment and Management 0 '[]'$  - 0 Ostomy Care Assessment and Management (repouching, etc.) PROCESS - Coordination of Care X - Simple Patient / Family Education for ongoing care 1 15 '[]'$  - 0 Complex (extensive) Patient / Family Education for ongoing care '[]'$  - 0 Staff obtains Programmer, systems, Records, Test Results / Process Orders '[]'$  - 0 Staff telephones HHA, Nursing Homes / Clarify orders / etc '[]'$  - 0 Routine Transfer to another Facility (non-emergent condition) '[]'$  - 0 Routine Hospital Admission (non-emergent condition) '[]'$  - 0 New Admissions / Biomedical engineer / Ordering NPWT, Apligraf, etc. '[]'$  - 0 Emergency Hospital Admission (emergent condition) X- 1 10 Simple Discharge Coordination '[]'$  - 0 Complex (extensive) Discharge Coordination PROCESS - Special Needs '[]'$  - Pediatric / Minor Patient Management 0 '[]'$  - 0 Isolation Patient Management '[]'$  - 0 Hearing / Language / Visual special needs '[]'$  - 0 Assessment of Community assistance (transportation, D/C planning, etc.) '[]'$  - 0 Additional assistance / Altered mentation '[]'$  - 0 Support Surface(s) Assessment (bed, cushion, seat, etc.) INTERVENTIONS - Wound Cleansing / Measurement Hamler, Verlyn L. ([id number]) '[]'$  - 0 Simple Wound Cleansing - one wound '[]'$  - 0 Complex Wound Cleansing - multiple wounds '[]'$  - 0 Wound Imaging (photographs - any number of wounds) '[]'$  - 0 Wound Tracing (instead of photographs) '[]'$  - 0 Simple Wound Measurement - one wound '[]'$  - 0 Complex Wound Measurement - multiple wounds INTERVENTIONS -  Wound Dressings '[]'$  - Small Wound Dressing one or multiple wounds 0 '[]'$  - 0 Medium Wound Dressing one or multiple wounds '[]'$  -  0 Large Wound Dressing one or multiple wounds '[]'$  - 0 Application of Medications - topical '[]'$  - 0 Application of Medications - injection INTERVENTIONS - Miscellaneous '[]'$  - External ear exam 0 '[]'$  - 0 Specimen Collection (cultures, biopsies, blood, body fluids, etc.) '[]'$  - 0 Specimen(s) / Culture(s) sent or taken to Lab for analysis '[]'$  - 0 Patient Transfer (multiple staff / Civil Service fast streamer / Similar devices) '[]'$  - 0 Simple Staple / Suture removal (25 or less) '[]'$  - 0 Complex Staple / Suture removal (26 or more) '[]'$  - 0 Hypo / Hyperglycemic Management (close monitor of Blood Glucose) '[]'$  - 0 Ankle / Brachial Index (ABI) - do not check if billed separately X- 1 5 Vital Signs Has the patient been seen at the hospital within the last three years: Yes Total Score: 45 Level Of Care: New/Established - Level 2 Electronic Signature(s) Signed: 12/09/2020 5:00:35 PM By: Dolan Amen RN Entered By: Dolan Amen on 12/09/2020 14:39:00 [place], [name] ([id number]) -------------------------------------------------------------------------------- Encounter Discharge Information Details Patient Name: Vilardi, Eber L. Date of Service: 12/09/2020 2:00 PM Medical Record Number: [id number] Patient Account Number: [id number] Date of Birth/Sex: December 12, 1950 (70 y.o. M) Treating RN: Dolan Amen Primary Care Madden Garron: [name] Other Clinician: Referring Kobyn Kray: [name] Treating Nguyen Butler/Extender: Skipper Cliche in Treatment: 4 Encounter Discharge Information Items Discharge Condition: Stable Ambulatory Status: Ambulatory Discharge Destination: Home Transportation: Private Auto Accompanied By: self Schedule Follow-up Appointment: Yes Clinical Summary of Care: Electronic Signature(s) Signed: 12/09/2020 5:00:35 PM By: Dolan Amen RN Entered By: Dolan Amen on 12/09/2020 14:39:42 [place], [place] ([id number]) -------------------------------------------------------------------------------- Lower Extremity Assessment Details Patient Name: Gram, Cuong L. Date of Service: 12/09/2020 2:00 PM Medical Record Number: [id number] Patient Account Number: [id number] Date of Birth/Sex: [birth year] (70 y.o. M) Treating RN: Dolan Amen Primary Care Adie Vilar: [name] Other Clinician: Referring Donovan Persley: [name] Treating Garrie Elenes/Extender: Jeri Cos Weeks in Treatment: 4 Electronic Signature(s) Signed: 12/09/2020 5:00:35 PM By: Dolan Amen RN Entered By: Dolan Amen on 12/09/2020 14:23:17 Ptacek, Ahmod L. ([id number]) -------------------------------------------------------------------------------- Multi Wound Chart Details Patient Name: Borror, Nickolas L. Date of Service: 12/09/2020 2:00 PM Medical Record Number: [id number] Patient Account Number: [id number] Date of Birth/Sex: [birth year] (70 y.o. M) Treating RN: Dolan Amen Primary Care Sou Nohr: [name] Other Clinician: Referring Cynara Tatham: [name] Treating Aliciana Ricciardi/Extender: Jeri Cos Weeks in Treatment: 4 Vital Signs Height(in): 73 Pulse(bpm): 60 Weight(lbs): 181 Blood Pressure(mmHg): 146/78 Body Mass Index(BMI): 24 Temperature(F): 98.7 Respiratory Rate(breaths/min): 18 Photos: [N/A:N/A] Wound Location: Right, Lateral Lower Leg N/A N/A Wounding Event: Laceration N/A N/A Primary Etiology: Trauma, Other N/A N/A Comorbid History: Coronary Artery Disease, N/A N/A Hypertension, Type II Diabetes Date Acquired: 09/22/2020 N/A N/A Weeks of Treatment: 4 N/A N/A Wound Status: Open N/A N/A Measurements L x W x D (cm) 0x0x0 N/A N/A Area (cm) : 0 N/A N/A Volume (cm) : 0 N/A N/A % Reduction in Area: 100.00% N/A N/A % Reduction in Volume: 100.00% N/A N/A Classification: Full Thickness Without Exposed N/A N/A Support Structures Exudate Amount: None  Present N/A N/A Granulation Amount: None Present (0%) N/A N/A Necrotic Amount: None Present (0%) N/A N/A Exposed Structures: Fascia: No N/A N/A Fat Layer (Subcutaneous Tissue): No Tendon: No Muscle: No Joint: No Bone: No Epithelialization: Large (67-100%) N/A N/A Treatment Notes Electronic Signature(s) Signed: 12/09/2020 5:00:35 PM By: Dolan Amen RN Entered By: Dolan Amen on 12/09/2020 14:38:27 [place], [name] ([id number]) -------------------------------------------------------------------------------- [place] Details Patient Name: Mcinerney, Berton L.  Date of Service: 12/09/2020 2:00 PM Medical Record Number: [id number] Patient Account Number: [id number] Date of Birth/Sex: 1950/06/06 (70 y.o. M) Treating RN: Dolan Amen Primary Care Kasie Leccese: [name] Other Clinician: Referring Hardin Hardenbrook: [name] Treating Reisa Coppola/Extender: Jeri Cos Weeks in Treatment: 4 Active Inactive Electronic Signature(s) Signed: 12/09/2020 5:00:35 PM By: Dolan Amen RN Entered By: Dolan Amen on 12/09/2020 14:38:21 [place], [place] ([id number]) -------------------------------------------------------------------------------- Pain Assessment Details Patient Name: Venier, Izak L. Date of Service: 12/09/2020 2:00 PM Medical Record Number: [id number] Patient Account Number: [id number] Date of Birth/Sex: [birth year] (70 y.o. M) Treating RN: Dolan Amen Primary Care Magalie Almon: [name] Other Clinician: Referring Melbert Botelho: [name] Treating Mena Simonis/Extender: Jeri Cos Weeks in Treatment: 4 Active Problems Location of Pain Severity and Description of Pain Patient Has Paino No Site Locations Rate the pain. Current Pain Level: 0 Pain Management and Medication Current Pain Management: Electronic Signature(s) Signed: 12/09/2020 5:00:35 PM By: Dolan Amen RN Entered By: Dolan Amen on 12/09/2020 14:21:02 [place], [name]  ([id number]) -------------------------------------------------------------------------------- Patient/Caregiver Education Details Patient Name: Hendrie, Prestyn L. Date of Service: 12/09/2020 2:00 PM Medical Record Number: [id number] Patient Account Number: [id number] Date of Birth/Gender: [birth year] (70 y.o. M) Treating RN: Dolan Amen Primary Care Physician: [name] Other Clinician: Referring Physician: [name] Treating Physician/Extender: Skipper Cliche in Treatment: 4 Education Assessment Education Provided To: Patient Education Topics Provided Notes discharge instructions Electronic Signature(s) Signed: 12/09/2020 5:00:35 PM By: Dolan Amen RN Entered By: Dolan Amen on 12/09/2020 14:39:20 [place], [name] ([id number]) -------------------------------------------------------------------------------- Wound Assessment Details Patient Name: Papania, Misael L. Date of Service: 12/09/2020 2:00 PM Medical Record Number: [id number] Patient Account Number: [id number] Date of Birth/Sex: 1950/06/22 (70 y.o. M) Treating RN: Dolan Amen Primary Care Lyn Joens: [name] Other Clinician: Referring Kingsley Herandez: [name] Treating Colleene Swarthout/Extender: Jeri Cos Weeks in Treatment: 4 Wound Status Wound Number: 1 Primary Etiology: Trauma, Other Wound Location: Right, Lateral Lower Leg Wound Status: Open Wounding Event: Laceration Comorbid Coronary Artery Disease, Hypertension, Type II History: Diabetes Date Acquired: 09/22/2020 Weeks Of Treatment: 4 Clustered Wound: No Photos Wound Measurements Length: (cm) 0 Width: (cm) 0 Depth: (cm) 0 Area: (cm) 0 Volume: (cm) 0 % Reduction in Area: 100% % Reduction in Volume: 100% Epithelialization: Large (67-100%) Tunneling: No Undermining: No Wound Description Classification: Full Thickness Without Exposed Support Structures Exudate Amount: None Present Foul Odor After Cleansing: No Slough/Fibrino No Wound  Bed Granulation Amount: None Present (0%) Exposed Structure Necrotic Amount: None Present (0%) Fascia Exposed: No Fat Layer (Subcutaneous Tissue) Exposed: No Tendon Exposed: No Muscle Exposed: No Joint Exposed: No Bone Exposed: No Electronic Signature(s) Signed: 12/09/2020 5:00:35 PM By: Dolan Amen RN Entered By: Dolan Amen on 12/09/2020 14:23:04 [place], [place] ([id number]) -------------------------------------------------------------------------------- Vitals Details Patient Name: Kobrin, Adeeb L. Date of Service: 12/09/2020 2:00 PM Medical Record Number: [id number] Patient Account Number: [id number] Date of Birth/Sex: 11-19-1950 (70 y.o. M) Treating RN: Dolan Amen Primary Care Demetrio Leighty: [name] Other Clinician: Referring Aden Youngman: [name] Treating Etta Gassett/Extender: Jeri Cos Weeks in Treatment: 4 Vital Signs Time Taken: 14:20 Temperature (F): 98.7 Height (in): 73 Pulse (bpm): 60 Weight (lbs): 181 Respiratory Rate (breaths/min): 18 Body Mass Index (BMI): 23.9 Blood Pressure (mmHg): 146/78 Reference Range: 80 - 120 mg / dl Electronic Signature(s) Signed: 12/09/2020 5:00:35 PM By: Dolan Amen RN Entered By: Dolan Amen on 12/09/2020 14:20:57

## 2020-12-09 NOTE — Progress Notes (Addendum)
JERMOND, TRIGLIA ([id number]) Visit Report for 12/09/2020 Chief Complaint Document Details Patient Name: [name], [name] Date of Service: 12/09/2020 2:00 PM Medical Record Number: [id number] Patient Account Number: [id number] Date of Birth/Sex: 1950-10-30 (70 y.o. M) Treating RN: Dolan Amen Primary Care Provider: [name] Other Clinician: Referring Provider: [name] Treating Provider/Extender: Andrew [name] Weeks in Treatment: 4 Information Obtained from: Patient Chief Complaint Right leg ulcer Electronic Signature(s) Signed: 12/09/2020 2:34:21 PM By: Worthy Keeler PA-C Entered By: Worthy Keeler on 12/09/2020 14:34:21 Andrew [name] ([id number]) -------------------------------------------------------------------------------- HPI Details Patient Name: [name], [name] Date of Service: 12/09/2020 2:00 PM Medical Record Number: [id number] Patient Account Number: [id number] Date of Birth/Sex: 1950-10-17 (70 y.o. M) Treating RN: Dolan Amen Primary Care Provider: [name] Other Clinician: Referring Provider: [name] Treating Provider/Extender: Skipper Cliche in Treatment: 4 History of Present Illness HPI Description: 11/07/2020 upon evaluation today patient presents for initial evaluation here in clinic concerning a traumatic ulcer that he has on the lateral portion of his leg in the calf region on the right. With that being said he has been on Augmentin although I do not see any signs of active infection at this time which is great news. Fortunately there is no evidence of infection currently as well which is also great news. No fevers, chills, nausea, vomiting, or diarrhea. The patient's ABI today was 1.13. He also has a history of hypertension for which she is on medication otherwise no major medical problems that would affect his healing currently. This occurred as a result of him hitting this on the edge of a storm door. 11/17/2020 upon evaluation  today patient appears to be doing better in regard to his wound. With that being said this does still appear to be open but is not nearly the size that it was previous. I am very pleased actually with where things stand and I think that the patient is making excellent progress here. Still I think that there is a small opening but again I think the collagen can help he just needs to make sure to change this on a every other day basis. 8/31; patient presents for 1 week follow-up. He has been using collagen every other day to the wound bed. He has no issues or complaints today. He denies signs of infection. 12/09/2020 upon evaluation today patient actually appears to be doing excellent at this point. Fortunately there does not appear to be any signs of infection which is great and overall feel like he is actually completely healed. Electronic Signature(s) Signed: 12/09/2020 3:02:26 PM By: Worthy Keeler PA-C Entered By: Worthy Keeler on 12/09/2020 15:02:26 Andrew [name] ([id number]) -------------------------------------------------------------------------------- Physical Exam Details Patient Name: [name], [name] Date of Service: 12/09/2020 2:00 PM Medical Record Number: [id number] Patient Account Number: [id number] Date of Birth/Sex: 08/17/1950 (70 y.o. M) Treating RN: Dolan Amen Primary Care Provider: [name] Other Clinician: Referring Provider: [name] Treating Provider/Extender: Andrew [name] Weeks in Treatment: 4 Constitutional Well-nourished and well-hydrated in no acute distress. Respiratory normal breathing without difficulty. Psychiatric this patient is able to make decisions and demonstrates good insight into disease process. Alert and Oriented x 3. pleasant and cooperative. Notes Upon inspection patient's wound bed showed signs of good granulation epithelization at this point. Fortunately there does not appear to be any evidence of active infection  systemically at this time which is great news and overall I am extremely pleased with where the patient stands today. Electronic Signature(s) Signed: 12/09/2020 3:02:43  PM By: Worthy Keeler PA-C Entered By: Worthy Keeler on 12/09/2020 15:02:43 Andrew [name] ([id number]) -------------------------------------------------------------------------------- Physician Orders Details Patient Name: [name], [name] Date of Service: 12/09/2020 2:00 PM Medical Record Number: [id number] Patient Account Number: [id number] Date of Birth/Sex: 1950/07/06 (70 y.o. M) Treating RN: Dolan Amen Primary Care Provider: [name] Other Clinician: Referring Provider: [name] Treating Provider/Extender: Skipper Cliche in Treatment: 4 Verbal / Phone Orders: No Diagnosis Coding ICD-10 Coding Code Description [phone] Laceration without foreign body, right lower leg, initial encounter L97.812 Non-pressure chronic ulcer of other part of right lower leg with fat layer exposed I10 Essential (primary) hypertension Discharge From [facility] [place] o Discharge from [facility] Treatment Complete Electronic Signature(s) Signed: 12/09/2020 5:00:35 PM By: Dolan Amen RN Signed: 12/09/2020 9:56:09 PM By: Worthy Keeler PA-C Entered By: Dolan Amen on 12/09/2020 14:38:44 [name], [name] ([id number]) -------------------------------------------------------------------------------- Problem List Details Patient Name: [name], [name] Date of Service: 12/09/2020 2:00 PM Medical Record Number: [id number] Patient Account Number: [id number] Date of Birth/Sex: December 30, 1950 (70 y.o. M) Treating RN: Dolan Amen Primary Care Provider: [name] Other Clinician: Referring Provider: [name] Treating Provider/Extender: Andrew [name] Weeks in Treatment: 4 Active Problems ICD-10 Encounter Code Description Active Date MDM Diagnosis S81.811A Laceration without foreign body, right lower leg,  initial encounter 11/07/2020 No Yes L97.812 Non-pressure chronic ulcer of other part of right lower leg with fat layer 11/07/2020 No Yes exposed [place] (primary) hypertension 11/07/2020 No Yes Inactive Problems Resolved Problems Electronic Signature(s) Signed: 12/09/2020 2:34:11 PM By: Worthy Keeler PA-C Entered By: Worthy Keeler on 12/09/2020 14:34:11 [place], [place] ([id number]) -------------------------------------------------------------------------------- Progress Note Details Patient Name: Berroa, Nyzaiah L. Date of Service: 12/09/2020 2:00 PM Medical Record Number: [id number] Patient Account Number: [id number] Date of Birth/Sex: 1950/10/06 (70 y.o. M) Treating RN: Dolan Amen Primary Care Provider: [name] Other Clinician: Referring Provider: [name] Treating Provider/Extender: Skipper Cliche in Treatment: 4 Subjective Chief Complaint Information obtained from Patient Right leg ulcer History of Present Illness (HPI) 11/07/2020 upon evaluation today patient presents for initial evaluation here in clinic concerning a traumatic ulcer that he has on the lateral portion of his leg in the calf region on the right. With that being said he has been on Augmentin although I do not see any signs of active infection at this time which is great news. Fortunately there is no evidence of infection currently as well which is also great news. No fevers, chills, nausea, vomiting, or diarrhea. The patient's ABI today was 1.13. He also has a history of hypertension for which she is on medication otherwise no major medical problems that would affect his healing currently. This occurred as a result of him hitting this on the edge of a storm door. 11/17/2020 upon evaluation today patient appears to be doing better in regard to his wound. With that being said this does still appear to be open but is not nearly the size that it was previous. I am very pleased actually with where  things stand and I think that the patient is making excellent progress here. Still I think that there is a small opening but again I think the collagen can help he just needs to make sure to change this on a every other day basis. 8/31; patient presents for 1 week follow-up. He has been using collagen every other day to the wound bed. He has no issues or complaints today. He denies signs of infection. 12/09/2020  upon evaluation today patient actually appears to be doing excellent at this point. Fortunately there does not appear to be any signs of infection which is great and overall feel like he is actually completely healed. Objective Constitutional Well-nourished and well-hydrated in no acute distress. Vitals Time Taken: 2:20 PM, Height: 73 in, Weight: 181 lbs, BMI: 23.9, Temperature: 98.7 F, Pulse: 60 bpm, Respiratory Rate: 18 breaths/min, Blood Pressure: 146/78 mmHg. Respiratory normal breathing without difficulty. Psychiatric this patient is able to make decisions and demonstrates good insight into disease process. Alert and Oriented x 3. pleasant and cooperative. General Notes: Upon inspection patient's wound bed showed signs of good granulation epithelization at this point. Fortunately there does not appear to be any evidence of active infection systemically at this time which is great news and overall I am extremely pleased with where the patient stands today. Integumentary (Hair, Skin) Wound #1 status is Open. Original cause of wound was Laceration. The date acquired was: 09/22/2020. The wound has been in treatment 4 weeks. The wound is located on the Right,Lateral Lower Leg. The wound measures 0cm length x 0cm width x 0cm depth; 0cm^2 area and 0cm^3 volume. There is no tunneling or undermining noted. There is a none present amount of drainage noted. There is no granulation within the wound bed. There is no necrotic tissue within the wound bed. Assessment Kendle, Tavaris L.  ([id number]) Active Problems ICD-10 Laceration without foreign body, right lower leg, initial encounter Non-pressure chronic ulcer of other part of right lower leg with fat layer exposed Essential (primary) hypertension Plan Discharge From [facility] Services: Discharge from [facility] Treatment Complete 1. Being that the patient is completely healed I would recommend that we go ahead and discontinue wound care services. 2. I am also can recommend patient monitor for any signs of worsening if anything occurs he should contact the office and let us know as soon as possible. We will see the patient back for follow-up visit as needed. Electronic Signature(s) Signed: 12/09/2020 3:07:27 PM By: Worthy Keeler PA-C Previous Signature: 12/09/2020 3:07:10 PM Version By: Worthy Keeler PA-C Entered By: Worthy Keeler on 12/09/2020 15:07:27 [place], Cherly [name] ([id number]) -------------------------------------------------------------------------------- SuperBill Details Patient Name: Volcy, Broghan L. Date of Service: 12/09/2020 Medical Record Number: [id number] Patient Account Number: [id number] Date of Birth/Sex: 1951-01-18 (70 y.o. M) Treating RN: Dolan Amen Primary Care Provider: [name] Other Clinician: Referring Provider: [name] Treating Provider/Extender: Andrew [name] Weeks in Treatment: 4 Diagnosis Coding ICD-10 Codes Code Description [phone] Laceration without foreign body, right lower leg, initial encounter L97.812 Non-pressure chronic ulcer of other part of right lower leg with fat layer exposed [place] (primary) hypertension Facility Procedures CPT4 Code: [id number] Description: [phone] - WOUND CARE VISIT-LEV 2 EST PT Modifier: Quantity: 1 Physician Procedures CPT4 Code: [id number] Description: [id number] - WC PHYS LEVEL 3 - EST PT Modifier: Quantity: 1 CPT4 Code: Description: ICD-10 Diagnosis Description [id number] Laceration without foreign body, right lower leg,  initial encounter L97.812 Non-pressure chronic ulcer of other part of right lower leg with fat la I10 Essential (primary) hypertension Modifier: yer exposed Quantity: Electronic Signature(s) Signed: 12/09/2020 3:07:39 PM By: Worthy Keeler PA-C Entered By: Worthy Keeler on 12/09/2020 15:07:39

## 2020-12-11 ENCOUNTER — Encounter: Payer: PPO | Admitting: Speech Pathology

## 2020-12-15 ENCOUNTER — Ambulatory Visit: Payer: PPO | Admitting: Physician Assistant

## 2020-12-16 ENCOUNTER — Ambulatory Visit: Payer: PPO | Admitting: Speech Pathology

## 2020-12-16 ENCOUNTER — Other Ambulatory Visit: Payer: Self-pay

## 2020-12-16 DIAGNOSIS — G3184 Mild cognitive impairment, so stated: Secondary | ICD-10-CM

## 2020-12-16 DIAGNOSIS — R41841 Cognitive communication deficit: Secondary | ICD-10-CM

## 2020-12-16 DIAGNOSIS — IMO0001 Reserved for inherently not codable concepts without codable children: Secondary | ICD-10-CM

## 2020-12-17 NOTE — Therapy (Signed)
[place] MAIN [facility] SERVICES [address] [place], [place], [address] Phone: [phone]   Fax:  [phone]  Speech Language Pathology Treatment  Patient Details  Name: Andrew [name] MRN: [id number] Date of Birth: 10-26-1950 Referring Provider (SLP): Jennings Books   Encounter Date: 12/16/2020   End of Session - 12/17/20 1119     Visit Number 17    Number of Visits 25    Date for SLP Re-Evaluation 01/02/21    Authorization Type [employer]    Authorization Time Period 10/08/2020 thru 01/12/2021    Authorization - Visit Number 7    Progress Note Due on Visit 10    SLP Start Time 0800    SLP Stop Time  0900    SLP Time Calculation (min) 60 min    Activity Tolerance Patient tolerated treatment well             Past Medical History:  Diagnosis Date   Depression    Diabetes mellitus without complication ([place])    Hyperlipidemia    Hypertension     Past Surgical History:  Procedure Laterality Date   COLONOSCOPY WITH PROPOFOL N/A 07/13/2019   Procedure: COLONOSCOPY WITH PROPOFOL;  Surgeon: [name], MD;  Location: [facility] ENDOSCOPY;  Service: Endoscopy;  Laterality: N/A;   NASAL SINUS SURGERY     REPAIR OF ACUTE ASCENDING THORACIC AORTIC DISSECTION  12/2018    There were no vitals filed for this visit.   Subjective Assessment - 12/17/20 1118     Subjective "getting up this early feels like I am back working again"    Currently in Pain? No/denies                   ADULT SLP TREATMENT - 12/17/20 0001       Treatment Provided   Treatment provided Cognitive-Linquistic      Cognitive-Linquistic Treatment   Treatment focused on Cognition;Patient/family/caregiver education    Skilled Treatment COGNITION: pt shared goal - "to get out of the house and do more" - with SLP assistance, pt able to identify 2 immediate activities to achieve this goal; 1.) visit a friend this afternoon or tomorrow, 2.) research  several walking trails in the county, choose 1 and ask his wife to walk with him - DEDUCTIVE REASONING: moderate cues to decode information, minimal cues to use grid to deduce; SELECTIVE ATTENTION: minimal cues for topic maintenance x 3 during 45 minute session              SLP Education - 12/17/20 1119     Education Details deductive reasoning    Person(s) Educated Patient    Methods Explanation;Demonstration;Verbal cues    Comprehension Verbalized understanding;Returned demonstration              SLP Short Term Goals - 11/11/20 1644       SLP SHORT TERM GOAL #1   Title With minimal cues, pt will complete semi-complex reasoning task with 90% accuracy.    Time 10    Period --   sessions   Status On-going      SLP SHORT TERM GOAL #2   Title With minimal assistance, pt will complete tasks focusing on inferential reasoning with 80% accuracy.    Baseline goal met in time period, goal updated    Time 10    Period --   sessions   Status Revised      SLP SHORT TERM GOAL #3   Title Pt  will demonstrate selective attention for 45 minutes in a moderately distracting environment.    Time 10    Period --   session   Status On-going      SLP SHORT TERM GOAL #4   Title With minimal assistance, pt will demonstrate anticipatory awareness by providing appropriate solution to hypothetical safety situation.    Baseline goal met in time period, goal revised    Time 10    Period --   sessions   Status New              SLP Long Term Goals - 11/11/20 1646       SLP LONG TERM GOAL #1   Title Pt will complete complex reasoning tasks with > 90% ability.    Status On-going    Target Date 01/02/21      SLP LONG TERM GOAL #2   Title Pt will demonstrate divided attention to task for 30 minute intervals.    Status On-going    Target Date 01/02/21      SLP LONG TERM GOAL #3   Title Pt and wife will identify cognitive communication barriers and create compensatory strategies to  increase pt's functional independence within family, leisure and community activities.    Status On-going    Target Date 01/02/21              Plan - 12/17/20 1120     Clinical Impression Statement Pt presents with improving cognitive abilities as evidenced by improved use of compensatory memory strategies, semi-complex problem solving, emergent awareness and executive functions. Pt is close to meeting his personal goals but continues to need assistance developing additional strategies to realize them. Continue to recommend skilled ST services to target pt's cognitive deficits in an effort to increase pt's functional independence for return to leisure and community activities.    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, sequela [facility])    Problem List Patient Active Problem List   Diagnosis Date Noted   Aortic atherosclerosis ([place]) 05/02/2020   Screening for colon cancer    Aortic dissection proximal to innominate ([place]) 12/23/2018   Mixed hyperlipidemia 10/05/2016   Familial multiple lipoprotein-type hyperlipidemia 08/02/2014   Routine general medical examination at a health care facility 08/02/2014   Recurrent major depressive episodes ([place]) 08/02/2014   Essential (primary) hypertension 08/02/2014   Overweight 08/02/2014   Diabetes mellitus, type 2 ([place]) 08/02/2014    B. Rutherford Nail M.S., CCC-SLP, [place] Pathologist Rehabilitation  Services Office [phone]  Stormy Fabian 12/17/2020, 11:39 AM  [place] [address] [place] MAIN [facility] SERVICES [address] [place], [place], [address] Phone: [phone]   Fax:  [phone]   Name: Andrew [name] MRN: [id number] Date of Birth: [birth year]

## 2020-12-18 ENCOUNTER — Ambulatory Visit: Payer: PPO | Admitting: Speech Pathology

## 2020-12-22 ENCOUNTER — Encounter: Payer: PPO | Admitting: Speech Pathology

## 2020-12-23 ENCOUNTER — Ambulatory Visit: Payer: PPO | Admitting: Speech Pathology

## 2020-12-23 ENCOUNTER — Other Ambulatory Visit: Payer: Self-pay

## 2020-12-23 DIAGNOSIS — IMO0001 Reserved for inherently not codable concepts without codable children: Secondary | ICD-10-CM

## 2020-12-23 DIAGNOSIS — G3184 Mild cognitive impairment, so stated: Secondary | ICD-10-CM

## 2020-12-23 DIAGNOSIS — R41841 Cognitive communication deficit: Secondary | ICD-10-CM

## 2020-12-24 ENCOUNTER — Encounter: Payer: Self-pay | Admitting: Speech Pathology

## 2020-12-24 ENCOUNTER — Encounter: Payer: PPO | Admitting: Speech Pathology

## 2020-12-24 NOTE — Patient Instructions (Signed)
External and internal compensatory memory strategies Decrease environmental distractions

## 2020-12-24 NOTE — Therapy (Addendum)
[place] MAIN [facility] SERVICES [address] [place], [place], [address] Phone: [phone]   Fax:  [phone]  Speech Language Pathology Treatment  DISCHARGE SUMMARY   Patient Details  Name: Andrew [name] MRN: [id number] Date of Birth: [birth year] Referring Provider (SLP): Jennings Books   Encounter Date: 12/23/2020   End of Session - 12/24/20 0735     Visit Number 18    Number of Visits 25    Date for SLP Re-Evaluation 01/02/21    Authorization Type [employer]    Authorization Time Period 10/08/2020 thru 01/12/2021    Authorization - Visit Number 8    Progress Note Due on Visit 10    SLP Start Time 0905    SLP Stop Time  1000    SLP Time Calculation (min) 55 min    Activity Tolerance Patient tolerated treatment well             Past Medical History:  Diagnosis Date   Depression    Diabetes mellitus without complication ([place])    Hyperlipidemia    Hypertension     Past Surgical History:  Procedure Laterality Date   COLONOSCOPY WITH PROPOFOL N/A 07/13/2019   Procedure: COLONOSCOPY WITH PROPOFOL;  Surgeon: [name], MD;  Location: [facility] ENDOSCOPY;  Service: Endoscopy;  Laterality: N/A;   NASAL SINUS SURGERY     REPAIR OF ACUTE ASCENDING THORACIC AORTIC DISSECTION  12/2018    There were no vitals filed for this visit.   Subjective Assessment - 12/24/20 0719     Subjective "I fell asleep on the couch this morning"    Currently in Pain? No/denies                   ADULT SLP TREATMENT - 12/24/20 0001       Treatment Provided   Treatment provided Cognitive-Linquistic      Cognitive-Linquistic Treatment   Treatment focused on Cognition;Patient/family/caregiver education    Skilled Treatment COGNITION: moderate faded to minimal cues for selective attention to task during 45 minute interval; Cognitive Linguistic Quick Test administered, see results below             Cognitive Linguistic Quick  Test   The Cognitive Linguistic Quick Test (CLQT) was administered to assess the relative status of five cognitive domains: attention, memory, language, executive functioning, and visuospatial skills. Scores from 10 tasks were used to estimate severity ratings (for age groups 18-69 years and 70-89 years) for each domain, a clock drawing task, as well as an overall composite severity rating of cognition.      Task    Score    Personal Facts       8/8       Symbol Cancellation     11/12      Confrontation Naming    10/10     Clock Drawing      13/13     Story Retelling       5/10      Symbol Trails      10/10     Generative Naming      9/9      Design Memory     6/6     Mazes        8/8      Design Generation    7/13      All scores fell within normal range for age.   Cognitive Domain  Composite Score Severity Rating   Attention  190   WNL    Memory   155   WNL   Executive Function  34   WNL   Language   32   WNL   Visuospatial Skills  97   WNL   Clock Drawing   13/13   WNL   Composite Severity Rating WNL       SLP Education - 12/24/20 0724     Education Details use of external and internal compensatory memory aids; reducing distractions, results of formal cognitive assessment    Person(s) Educated Patient    Methods Explanation;Demonstration;Verbal cues;Handout    Comprehension Verbalized understanding;Returned demonstration              SLP Short Term Goals - 12/24/20 0736       SLP SHORT TERM GOAL #1   Title With minimal cues, pt will complete semi-complex reasoning task with 90% accuracy.    Status Achieved      SLP SHORT TERM GOAL #2   Title With minimal assistance, pt will complete tasks focusing on inferential reasoning with 80% accuracy.    Status Achieved      SLP SHORT TERM GOAL #3   Title Pt will demonstrate selective attention for 45 minutes in a moderately distracting environment.    Status Achieved      SLP SHORT TERM GOAL #4    Title With minimal assistance, pt will demonstrate anticipatory awareness by providing appropriate solution to hypothetical safety situation.    Status Achieved              SLP Long Term Goals - 12/24/20 0737       SLP LONG TERM GOAL #1   Title Pt will complete complex reasoning tasks with > 90% ability.    Status Achieved      SLP LONG TERM GOAL #2   Title Pt will demonstrate divided attention to task for 30 minute intervals.    Status Achieved      SLP LONG TERM GOAL #3   Title Pt and wife will identify cognitive communication barriers and create compensatory strategies to increase pt's functional independence within family, leisure and community activities.    Status Achieved              Plan - 12/24/20 0736     Clinical Impression Statement Pt requests this session be his last d/t his dog having surgery on Thursday. He states that he has made good progress towards his goal of "getting out of the house and doing more." His results of the Cognitive Linguistic Quick Test further support pt's overall cognitive progress. The Cognitive Linguistic Quick Test (CLQT) was administered to assess the relative status of five cognitive domains: attention, memory, language, executive functioning, and visuospatial skills. Scores from 10 tasks were used to estimate severity ratings (for age groups 18-69 years and 70-89 years) for each domain, a clock drawing task, as well as an overall composite severity rating of cognition.  [name] scores fall solidly within the normal range. Pt continues to have deficits in memory, insight into deficits and selective attention to tasks. Pt is able to demonstrate proficient use of external and internal memory strategies and education has been completed on ways to decrease distractions during tasks. At this time, pt is appropriate for discharge from skilled ST intervention.    SLP Home Exercise Plan provided    Consulted and Agree with Plan of Care Patient  Problem List Patient Active Problem List   Diagnosis Date Noted   Aortic atherosclerosis ([place]) 05/02/2020   Screening for colon cancer    Aortic dissection proximal to innominate ([place]) 12/23/2018   Mixed hyperlipidemia 10/05/2016   Familial multiple lipoprotein-type hyperlipidemia 08/02/2014   Routine general medical examination at a health care facility 08/02/2014   Recurrent major depressive episodes ([place]) 08/02/2014   Essential (primary) hypertension 08/02/2014   Overweight 08/02/2014   Diabetes mellitus, type 2 ([place]) 08/02/2014   Lukka Black B. Rutherford Nail M.S., CCC-SLP, [place] Pathologist Rehabilitation Services Office [phone]  Stormy Fabian 12/24/2020, 7:37 AM  [place] MAIN [facility] SERVICES [address] [place], [place], [address] Phone: [phone]   Fax:  [phone]   Name: LYNDOL VANDERHEIDEN MRN: [id number] Date of Birth: 1950/12/30

## 2020-12-25 ENCOUNTER — Ambulatory Visit: Payer: PPO | Admitting: Speech Pathology

## 2020-12-29 ENCOUNTER — Encounter: Payer: PPO | Admitting: Speech Pathology

## 2020-12-31 ENCOUNTER — Encounter: Payer: PPO | Admitting: Speech Pathology

## 2021-01-05 ENCOUNTER — Encounter: Payer: PPO | Admitting: Speech Pathology

## 2021-01-07 ENCOUNTER — Encounter: Payer: PPO | Admitting: Speech Pathology

## 2021-01-12 ENCOUNTER — Encounter: Payer: PPO | Admitting: Speech Pathology

## 2021-01-15 DIAGNOSIS — R292 Abnormal reflex: Secondary | ICD-10-CM | POA: Diagnosis not present

## 2021-01-15 DIAGNOSIS — F1021 Alcohol dependence, in remission: Secondary | ICD-10-CM | POA: Diagnosis not present

## 2021-01-15 DIAGNOSIS — R4189 Other symptoms and signs involving cognitive functions and awareness: Secondary | ICD-10-CM | POA: Diagnosis not present

## 2021-01-16 ENCOUNTER — Encounter: Payer: PPO | Admitting: Speech Pathology

## 2021-01-19 ENCOUNTER — Encounter: Payer: PPO | Admitting: Speech Pathology

## 2021-01-21 ENCOUNTER — Encounter: Payer: PPO | Admitting: Speech Pathology

## 2021-01-28 ENCOUNTER — Encounter: Payer: PPO | Admitting: Speech Pathology

## 2021-02-03 ENCOUNTER — Encounter: Payer: Self-pay | Admitting: Family Medicine

## 2021-02-03 ENCOUNTER — Other Ambulatory Visit: Payer: Self-pay

## 2021-02-03 ENCOUNTER — Ambulatory Visit (INDEPENDENT_AMBULATORY_CARE_PROVIDER_SITE_OTHER): Payer: PPO | Admitting: Family Medicine

## 2021-02-03 DIAGNOSIS — E7801 Familial hypercholesterolemia: Secondary | ICD-10-CM | POA: Diagnosis not present

## 2021-02-03 DIAGNOSIS — I10 Essential (primary) hypertension: Secondary | ICD-10-CM

## 2021-02-03 DIAGNOSIS — E119 Type 2 diabetes mellitus without complications: Secondary | ICD-10-CM | POA: Diagnosis not present

## 2021-02-03 MED ORDER — METFORMIN HCL 1000 MG PO TABS: 1000.0000 mg | ORAL_TABLET | Freq: Two times a day (BID) | ORAL | 1 refills | Status: DC

## 2021-02-03 MED ORDER — GLIPIZIDE ER 2.5 MG PO TB24: 2.5000 mg | ORAL_TABLET | Freq: Every day | ORAL | 1 refills | Status: DC

## 2021-02-03 NOTE — Progress Notes (Signed)
Date:  02/03/2021   Name:  Andrew [name]   DOB:  [birth year]   MRN:  [id number]   Chief Complaint: Diabetes  Diabetes He presents for his follow-up diabetic visit. He has type 2 diabetes mellitus. His disease course has been stable. There are no hypoglycemic associated symptoms. Pertinent negatives for hypoglycemia include no dizziness, headaches or nervousness/anxiousness. Pertinent negatives for diabetes include no blurred vision, no chest pain, no fatigue, no foot paresthesias, no foot ulcerations, no polydipsia, no polyphagia, no polyuria, no visual change, no weakness and no weight loss. There are no hypoglycemic complications. Symptoms are stable. There are no diabetic complications. His weight is stable. He is following a generally healthy diet. Meal planning includes avoidance of concentrated sweets and carbohydrate counting. He participates in exercise daily. His breakfast blood glucose range is generally 90-110 mg/dl. An ACE inhibitor/angiotensin II receptor blocker is being taken. He does not see a podiatrist.Eye exam is not current.   Lab Results  Component Value Date   CREATININE 1.00 05/02/2020   BUN 20 05/02/2020   NA 143 05/02/2020   K 3.5 05/02/2020   CL 99 05/02/2020   CO2 30 05/02/2020   Lab Results  Component Value Date   CHOL 131 05/14/2020   HDL 45 05/14/2020   LDLCALC 65 05/14/2020   TRIG 119 05/14/2020   CHOLHDL 3.3 07/31/2018   No results found for: TSH Lab Results  Component Value Date   HGBA1C 6.3 (H) 09/30/2020   Lab Results  Component Value Date   WBC 6.2 05/02/2020   HGB 16.1 05/02/2020   HCT 46.5 05/02/2020   MCV 95.1 05/02/2020   PLT 300 05/02/2020   No results found for: ALT, AST, GGT, ALKPHOS, BILITOT   Review of Systems  Constitutional:  Negative for chills, fatigue, fever and weight loss.  HENT:  Negative for drooling, ear discharge, ear pain and sore throat.   Eyes:  Negative for blurred vision.  Respiratory:  Negative for cough,  shortness of breath and wheezing.   Cardiovascular:  Negative for chest pain, palpitations and leg swelling.  Gastrointestinal:  Negative for abdominal pain, blood in stool, constipation, diarrhea and nausea.  Endocrine: Negative for polydipsia, polyphagia and polyuria.  Genitourinary:  Negative for dysuria, frequency, hematuria and urgency.  Musculoskeletal:  Negative for back pain, myalgias and neck pain.  Skin:  Negative for rash.  Allergic/Immunologic: Negative for environmental allergies.  Neurological:  Negative for dizziness, weakness and headaches.  Hematological:  Does not bruise/bleed easily.  Psychiatric/Behavioral:  Negative for suicidal ideas. The patient is not nervous/anxious.    Patient Active Problem List   Diagnosis Date Noted   Aortic atherosclerosis ([place]) 05/02/2020   Screening for colon cancer    Aortic dissection proximal to innominate 12/23/2018   Mixed hyperlipidemia 10/05/2016   Familial multiple lipoprotein-type hyperlipidemia 08/02/2014   Routine general medical examination at a health care facility 08/02/2014   Recurrent major depressive episodes ([place]) 08/02/2014   Essential (primary) hypertension 08/02/2014   Overweight 08/02/2014   Diabetes mellitus, type 2 ([place]) 08/02/2014    No Known Allergies  Past Surgical History:  Procedure Laterality Date   COLONOSCOPY WITH PROPOFOL N/A 07/13/2019   Procedure: COLONOSCOPY WITH PROPOFOL;  Surgeon: [name], MD;  Location: [facility] ENDOSCOPY;  Service: Endoscopy;  Laterality: N/A;   NASAL SINUS SURGERY     REPAIR OF ACUTE ASCENDING THORACIC AORTIC DISSECTION  12/2018    Social History   Tobacco Use   Smoking status: Never  Smokeless tobacco: Never   Tobacco comments:    smoking cessation materials not required  Vaping Use   Vaping Use: Never used  Substance Use Topics   Alcohol use: Not Currently    Alcohol/week: 5.0 standard drinks    Types: 2 Glasses of wine, 3 Cans of beer per week    Comment:  weekly   Drug use: No     Medication list has been reviewed and updated.  Current Meds  Medication Sig   amLODipine (NORVASC) 10 MG tablet Take 1 tablet (10 mg total) by mouth daily.   aspirin EC 81 MG tablet Take 81 mg by mouth daily. Swallow whole.   atorvastatin (LIPITOR) 40 MG tablet Take 1 tablet (40 mg total) by mouth daily.   glipiZIDE (GLUCOTROL XL) 2.5 MG 24 hr tablet Take 1 tablet (2.5 mg total) by mouth daily with breakfast.   lisinopril-hydrochlorothiazide (ZESTORETIC) 20-12.5 MG tablet Take 1 tablet by mouth daily.   memantine (NAMENDA) 5 MG tablet Take 1 tablet by mouth daily.   metFORMIN (GLUCOPHAGE) 1000 MG tablet Take 1 tablet (1,000 mg total) by mouth 2 (two) times daily.   OneTouch Delica Lancets 42P MISC TEST ONCE DAILY   ONETOUCH VERIO test strip TEST ONCE DAILY   venlafaxine XR (EFFEXOR-XR) 150 MG 24 hr capsule Take 1 capsule (150 mg total) by mouth daily with breakfast.    PHQ 2/9 Scores 02/03/2021 10/24/2020 07/24/2020 06/26/2020  PHQ - 2 Score 0 0 0 0  PHQ- 9 Score 0 0 0 0    GAD 7 : Generalized Anxiety Score 02/03/2021 10/24/2020 07/24/2020 06/26/2020  Nervous, Anxious, on Edge 0 0 0 0  Control/stop worrying 0 0 0 0  Worry too much - different things 0 0 0 0  Trouble relaxing 0 0 0 0  Restless 0 0 0 0  Easily annoyed or irritable 0 0 0 0  Afraid - awful might happen 0 0 0 0  Total GAD 7 Score 0 0 0 0  Anxiety Difficulty - - - -    BP Readings from Last 3 Encounters:  02/03/21 (!) 130/100  10/24/20 120/78  10/03/20 130/72    Physical Exam Vitals and nursing note reviewed.  HENT:     Head: Normocephalic.     Right Ear: Tympanic membrane, ear canal and external ear normal.     Left Ear: Tympanic membrane, ear canal and external ear normal.     Nose: Nose normal. No congestion or rhinorrhea.     Mouth/Throat:     Mouth: Mucous membranes are moist.     Pharynx: No oropharyngeal exudate or posterior oropharyngeal erythema.  Eyes:     General: No  scleral icterus.       Right eye: No discharge.        Left eye: No discharge.     Conjunctiva/sclera: Conjunctivae normal.     Pupils: Pupils are equal, round, and reactive to light.  Neck:     Thyroid: No thyromegaly.     Vascular: No carotid bruit or JVD.     Trachea: No tracheal deviation.  Cardiovascular:     Rate and Rhythm: Normal rate and regular rhythm.     Heart sounds: Normal heart sounds. No murmur heard.   No friction rub. No gallop.  Pulmonary:     Effort: No respiratory distress.     Breath sounds: Normal breath sounds. No wheezing, rhonchi or rales.  Abdominal:     General: Bowel sounds are normal.  Palpations: Abdomen is soft. There is no mass.     Tenderness: There is no abdominal tenderness. There is no guarding or rebound.  Musculoskeletal:        General: No tenderness. Normal range of motion.     Cervical back: Normal range of motion and neck supple.  Lymphadenopathy:     Cervical: No cervical adenopathy.  Skin:    General: Skin is warm.     Findings: No rash.  Neurological:     Mental Status: He is alert and oriented to person, place, and time.     Cranial Nerves: No cranial nerve deficit.     Deep Tendon Reflexes: Reflexes are normal and symmetric.    Wt Readings from Last 3 Encounters:  02/03/21 178 lb (80.7 kg)  10/24/20 181 lb (82.1 kg)  10/03/20 183 lb (83 kg)    BP (!) 130/100   Pulse 64   Ht 6\' 1"  (1.854 m)   Wt 178 lb (80.7 kg)   BMI 23.48 kg/m   Assessment and Plan:  1. Essential (primary) hypertension Chronic.  Controlled.  Stable.  Blood pressure 130/86.  Continue amlodipine 10 mg and lisinopril hydrochlorothiazide 20-12.5 mg once a day.  Will check renal function panel for current level of control. - Renal Function Panel  2. Familial hypercholesterolemia Chronic.  Controlled.  Stable.  Continue atorvastatin 40 mg once a day.  3. Type 2 diabetes mellitus without complication, without long-term current use of insulin  (HCC) Chronic.  Controlled.  Stable.  Fasting blood sugars have been in acceptable range of 110 to 120 mg percent.  We will continue glipizide XL 2.5 mg as well as metformin 1 g twice a day.  Will check A1c and renal function panel for current stability of GFR and level of diabetic control. - glipiZIDE (GLUCOTROL XL) 2.5 MG 24 hr tablet; Take 1 tablet (2.5 mg total) by mouth daily with breakfast.  Dispense: 90 tablet; Refill: 1 - metFORMIN (GLUCOPHAGE) 1000 MG tablet; Take 1 tablet (1,000 mg total) by mouth 2 (two) times daily.  Dispense: 180 tablet; Refill: 1 - HgB A1c - Renal Function Panel

## 2021-02-04 LAB — RENAL FUNCTION PANEL
Albumin: 4.7 g/dL (ref 3.8–4.8)
BUN/Creatinine Ratio: 25 — ABNORMAL HIGH (ref 10–24)
BUN: 28 mg/dL — ABNORMAL HIGH (ref 8–27)
CO2: 26 mmol/L (ref 20–29)
Calcium: 9.8 mg/dL (ref 8.6–10.2)
Chloride: 100 mmol/L (ref 96–106)
Creatinine, Ser: 1.14 mg/dL (ref 0.76–1.27)
Glucose: 110 mg/dL — ABNORMAL HIGH (ref 70–99)
Phosphorus: 3.8 mg/dL (ref 2.8–4.1)
Potassium: 4.9 mmol/L (ref 3.5–5.2)
Sodium: 142 mmol/L (ref 134–144)
eGFR: 70 mL/min/{1.73_m2} (ref >59)

## 2021-02-04 LAB — HEMOGLOBIN A1C
Est. average glucose Bld gHb Est-mCnc: 123 mg/dL
Hgb A1c MFr Bld: 5.9 % — ABNORMAL HIGH (ref 4.8–5.6)

## 2021-04-03 ENCOUNTER — Encounter: Payer: Self-pay | Admitting: Family Medicine

## 2021-04-03 ENCOUNTER — Ambulatory Visit (INDEPENDENT_AMBULATORY_CARE_PROVIDER_SITE_OTHER): Payer: PPO | Admitting: Family Medicine

## 2021-04-03 ENCOUNTER — Other Ambulatory Visit: Payer: Self-pay

## 2021-04-03 VITALS — BP 138/90 | HR 69 | Ht 73.0 in | Wt 186.0 lb

## 2021-04-03 DIAGNOSIS — F339 Major depressive disorder, recurrent, unspecified: Secondary | ICD-10-CM

## 2021-04-03 DIAGNOSIS — F09 Unspecified mental disorder due to known physiological condition: Secondary | ICD-10-CM

## 2021-04-03 DIAGNOSIS — F5101 Primary insomnia: Secondary | ICD-10-CM

## 2021-04-03 DIAGNOSIS — R4586 Emotional lability: Secondary | ICD-10-CM | POA: Diagnosis not present

## 2021-04-03 MED ORDER — VENLAFAXINE HCL ER 37.5 MG PO CP24: 37.5000 mg | ORAL_CAPSULE | Freq: Every day | ORAL | 1 refills | Status: DC

## 2021-04-03 MED ORDER — TRAZODONE HCL 50 MG PO TABS: 25.0000 mg | ORAL_TABLET | Freq: Every evening | ORAL | 3 refills | Status: DC | PRN

## 2021-04-03 NOTE — Progress Notes (Signed)
Date:  04/03/2021   Name:  Andrew [name]   DOB:  Nov 13, 1950   MRN:  [id number]   Chief Complaint: Altered Mental Status (Anger is getting worse in the past month- having "really bad headache"- took a BC and it helped)  Altered Mental Status This is a new (mood changes) problem. The current episode started more than 1 month ago. The problem occurs intermittently. The problem has been waxing and waning. Pertinent negatives include no abdominal pain, chest pain, chills, congestion, coughing, fatigue, fever, headaches, myalgias, nausea, neck pain, rash, sore throat, visual change or vomiting. The treatment provided mild relief.  Depression        This is a chronic problem.  The current episode started more than 1 year ago.   The problem occurs daily.  The problem has been gradually worsening since onset.  Associated symptoms include decreased concentration, irritable, restlessness, decreased interest and sad.  Associated symptoms include no fatigue, no helplessness, no hopelessness, does not have insomnia, no appetite change, no body aches, no myalgias, no headaches, no indigestion and no suicidal ideas.  Past treatments include SNRIs - Serotonin and norepinephrine reuptake inhibitors.  Lab Results  Component Value Date   NA 142 02/03/2021   K 4.9 02/03/2021   CO2 26 02/03/2021   GLUCOSE 110 (H) 02/03/2021   BUN 28 (H) 02/03/2021   CREATININE 1.14 02/03/2021   CALCIUM 9.8 02/03/2021   EGFR 70 02/03/2021   GFRNONAA >60 05/02/2020   Lab Results  Component Value Date   CHOL 131 05/14/2020   HDL 45 05/14/2020   LDLCALC 65 05/14/2020   TRIG 119 05/14/2020   CHOLHDL 3.3 07/31/2018   No results found for: TSH Lab Results  Component Value Date   HGBA1C 5.9 (H) 02/03/2021   Lab Results  Component Value Date   WBC 6.2 05/02/2020   HGB 16.1 05/02/2020   HCT 46.5 05/02/2020   MCV 95.1 05/02/2020   PLT 300 05/02/2020   No results found for: ALT, AST, GGT, ALKPHOS, BILITOT No results  found for: 25OHVITD2, 25OHVITD3, VD25OH   Review of Systems  Constitutional:  Negative for appetite change, chills, fatigue and fever.  HENT:  Negative for congestion, drooling, ear discharge, ear pain and sore throat.   Respiratory:  Negative for cough, shortness of breath and wheezing.   Cardiovascular:  Negative for chest pain, palpitations and leg swelling.  Gastrointestinal:  Negative for abdominal pain, blood in stool, constipation, diarrhea, nausea and vomiting.  Endocrine: Negative for polydipsia.  Genitourinary:  Negative for dysuria, frequency, hematuria and urgency.  Musculoskeletal:  Negative for back pain, myalgias and neck pain.  Skin:  Negative for rash.  Allergic/Immunologic: Negative for environmental allergies.  Neurological:  Negative for dizziness and headaches.  Hematological:  Does not bruise/bleed easily.  Psychiatric/Behavioral:  Positive for decreased concentration and depression. Negative for suicidal ideas. The patient is not nervous/anxious and does not have insomnia.    Patient Active Problem List   Diagnosis Date Noted   Aortic atherosclerosis ([place]) 05/02/2020   Screening for colon cancer    Aortic dissection proximal to innominate 12/23/2018   Mixed hyperlipidemia 10/05/2016   Familial multiple lipoprotein-type hyperlipidemia 08/02/2014   Routine general medical examination at a health care facility 08/02/2014   Recurrent major depressive episodes ([place]) 08/02/2014   Essential (primary) hypertension 08/02/2014   Overweight 08/02/2014   Diabetes mellitus, type 2 ([place]) 08/02/2014    No Known Allergies  Past Surgical History:  Procedure Laterality Date  COLONOSCOPY WITH PROPOFOL N/A 07/13/2019   Procedure: COLONOSCOPY WITH PROPOFOL;  Surgeon: [name], MD;  Location: [facility] ENDOSCOPY;  Service: Endoscopy;  Laterality: N/A;   NASAL SINUS SURGERY     REPAIR OF ACUTE ASCENDING THORACIC AORTIC DISSECTION  12/2018    Social History   Tobacco Use    Smoking status: Never   Smokeless tobacco: Never   Tobacco comments:    smoking cessation materials not required  Vaping Use   Vaping Use: Never used  Substance Use Topics   Alcohol use: Not Currently    Alcohol/week: 5.0 standard drinks    Types: 2 Glasses of wine, 3 Cans of beer per week    Comment: weekly   Drug use: No     Medication list has been reviewed and updated.  Current Meds  Medication Sig   amLODipine (NORVASC) 10 MG tablet Take 1 tablet (10 mg total) by mouth daily.   aspirin EC 81 MG tablet Take 81 mg by mouth daily. Swallow whole.   atorvastatin (LIPITOR) 40 MG tablet Take 1 tablet (40 mg total) by mouth daily.   glipiZIDE (GLUCOTROL XL) 2.5 MG 24 hr tablet Take 1 tablet (2.5 mg total) by mouth daily with breakfast.   lisinopril-hydrochlorothiazide (ZESTORETIC) 20-12.5 MG tablet Take 1 tablet by mouth daily.   memantine (NAMENDA) 5 MG tablet Take 1 tablet by mouth daily.   metFORMIN (GLUCOPHAGE) 1000 MG tablet Take 1 tablet (1,000 mg total) by mouth 2 (two) times daily.   OneTouch Delica Lancets 10C MISC TEST ONCE DAILY   ONETOUCH VERIO test strip TEST ONCE DAILY   venlafaxine XR (EFFEXOR-XR) 150 MG 24 hr capsule Take 1 capsule (150 mg total) by mouth daily with breakfast.    PHQ 2/9 Scores 04/03/2021 02/03/2021 10/24/2020 07/24/2020  PHQ - 2 Score 4 0 0 0  PHQ- 9 Score 7 0 0 0    GAD 7 : Generalized Anxiety Score 04/03/2021 02/03/2021 10/24/2020 07/24/2020  Nervous, Anxious, on Edge 1 0 0 0  Control/stop worrying 2 0 0 0  Worry too much - different things 3 0 0 0  Trouble relaxing 0 0 0 0  Restless 0 0 0 0  Easily annoyed or irritable 2 0 0 0  Afraid - awful might happen 0 0 0 0  Total GAD 7 Score 8 0 0 0  Anxiety Difficulty Somewhat difficult - - -    BP Readings from Last 3 Encounters:  04/03/21 138/90  02/03/21 130/86  10/24/20 120/78    Physical Exam Vitals and nursing note reviewed.  Constitutional:      General: He is irritable.  HENT:      Head: Normocephalic.     Right Ear: Tympanic membrane, ear canal and external ear normal. There is no impacted cerumen.     Left Ear: Tympanic membrane, ear canal and external ear normal. There is no impacted cerumen.     Nose: Nose normal. No congestion or rhinorrhea.  Eyes:     General: No scleral icterus.       Right eye: No discharge.        Left eye: No discharge.     Conjunctiva/sclera: Conjunctivae normal.     Pupils: Pupils are equal, round, and reactive to light.  Neck:     Thyroid: No thyromegaly.     Vascular: No JVD.     Trachea: No tracheal deviation.  Cardiovascular:     Rate and Rhythm: Normal rate and regular rhythm.  Heart sounds: Normal heart sounds. No murmur heard.   No friction rub. No gallop.  Pulmonary:     Effort: No respiratory distress.     Breath sounds: Normal breath sounds. No wheezing, rhonchi or rales.  Chest:     Chest wall: No tenderness.  Abdominal:     General: Bowel sounds are normal.     Palpations: Abdomen is soft. There is no mass.     Tenderness: There is no abdominal tenderness. There is no guarding or rebound.  Musculoskeletal:        General: No tenderness. Normal range of motion.     Cervical back: Normal range of motion and neck supple.  Lymphadenopathy:     Cervical: No cervical adenopathy.  Skin:    General: Skin is warm.     Findings: No rash.  Neurological:     Mental Status: He is alert and oriented to person, place, and time.     Cranial Nerves: No cranial nerve deficit.     Deep Tendon Reflexes: Reflexes are normal and symmetric.    Wt Readings from Last 3 Encounters:  04/03/21 186 lb (84.4 kg)  02/03/21 178 lb (80.7 kg)  10/24/20 181 lb (82.1 kg)    BP 138/90    Pulse 69    Ht _0  (1.854 m)    Wt 186 lb (84.4 kg)    SpO2 94%    BMI 24.54 kg/m   Assessment and Plan:  1. Mood changes New onset.  Persistent.  Patient has noticed some changes in his mood and this is been witnessed by his spouse.  Patient has  become more restless and irritable which is not his usual disposition.  PHQ was noted to be 7 with a gad score of 8.  This could be a combination of either anxiety depression pseudodementia.  Given the change in the mental status we are referring to neurology for reevaluation and whether or not it would be prudent to repeat an MRI as that is been over a year.  In the meantime we will increase his Effexor by 37.5 mg. - venlafaxine XR (EFFEXOR XR) 37.5 MG 24 hr capsule; Take 1 capsule (37.5 mg total) by mouth daily with breakfast.  Dispense: 30 capsule; Refill: 1 - Ambulatory referral to Neurology  2. Recurrent major depressive episodes (HCC) Chronic.  Gradually worsening.  Episodic and relapses occur on an unanticipated basis.  Patient becomes irritable and restless at the most insignificant times.  We will increase venlafaxine by 37.5 mg and will recheck patient in 4 to 6 weeks. - venlafaxine XR (EFFEXOR XR) 37.5 MG 24 hr capsule; Take 1 capsule (37.5 mg total) by mouth daily with breakfast.  Dispense: 30 capsule; Refill: 1  3. Primary insomnia Also contributing to this is a patient's had increased insomnia and if we increase the Effexor in the morning then we will provide some trazodone 50 mg 25 to 50 mg nightly. - traZODone (DESYREL) 50 MG tablet; Take 0.5-1 tablets (25-50 mg total) by mouth at bedtime as needed for sleep.  Dispense: 30 tablet; Refill: 3  4. Cognitive disorder Given his baseline cognitive disorder and his baseline decline from most recent neurologic evaluation we are returning to neurology for reevaluation and determination if further imaging is going to be necessary given the changes in his cognitive and mood circumstances. - Ambulatory referral to Neurology

## 2021-04-07 ENCOUNTER — Other Ambulatory Visit: Payer: Self-pay | Admitting: Family Medicine

## 2021-04-07 DIAGNOSIS — E7801 Familial hypercholesterolemia: Secondary | ICD-10-CM

## 2021-04-07 NOTE — Telephone Encounter (Signed)
Requested Prescriptions  Pending Prescriptions Disp Refills   atorvastatin (LIPITOR) 40 MG tablet [Pharmacy Med Name: ATORVASTATIN 40 MG TABLET] 90 tablet 0    Sig: TAKE 1 TABLET BY MOUTH EVERY DAY     Cardiovascular:  Antilipid - Statins Passed - 04/07/2021  1:45 AM      Passed - Total Cholesterol in normal range and within 360 days    Cholesterol, Total  Date Value Ref Range Status  05/14/2020 131 100 - 199 mg/dL Final         Passed - LDL in normal range and within 360 days    LDL Chol Calc (NIH)  Date Value Ref Range Status  05/14/2020 65 0 - 99 mg/dL Final         Passed - HDL in normal range and within 360 days    HDL  Date Value Ref Range Status  05/14/2020 45 >39 mg/dL Final         Passed - Triglycerides in normal range and within 360 days    Triglycerides  Date Value Ref Range Status  05/14/2020 119 0 - 149 mg/dL Final         Passed - Patient is not pregnant      Passed - Valid encounter within last 12 months    Recent Outpatient Visits          4 days ago Mood changes   [facility] Juline Patch, MD   2 months ago Essential (primary) hypertension   [facility] Juline Patch, MD   5 months ago Type 2 diabetes mellitus without complication, without long-term current use of insulin ([place])   [facility] Juline Patch, MD   6 months ago Essential (primary) hypertension   [facility] Juline Patch, MD   8 months ago Post concussion syndrome   [facility] Juline Patch, MD      Future Appointments            In 1 month Juline Patch, MD [facility], [facility]

## 2021-04-21 DIAGNOSIS — R413 Other amnesia: Secondary | ICD-10-CM | POA: Diagnosis not present

## 2021-04-21 DIAGNOSIS — G4700 Insomnia, unspecified: Secondary | ICD-10-CM | POA: Diagnosis not present

## 2021-04-21 DIAGNOSIS — E119 Type 2 diabetes mellitus without complications: Secondary | ICD-10-CM | POA: Diagnosis not present

## 2021-04-21 DIAGNOSIS — G3184 Mild cognitive impairment, so stated: Secondary | ICD-10-CM | POA: Diagnosis not present

## 2021-04-21 DIAGNOSIS — E559 Vitamin D deficiency, unspecified: Secondary | ICD-10-CM | POA: Diagnosis not present

## 2021-04-21 DIAGNOSIS — R4689 Other symptoms and signs involving appearance and behavior: Secondary | ICD-10-CM | POA: Diagnosis not present

## 2021-04-21 DIAGNOSIS — R292 Abnormal reflex: Secondary | ICD-10-CM | POA: Diagnosis not present

## 2021-04-25 ENCOUNTER — Other Ambulatory Visit: Payer: Self-pay | Admitting: Family Medicine

## 2021-04-25 DIAGNOSIS — F5101 Primary insomnia: Secondary | ICD-10-CM

## 2021-04-25 DIAGNOSIS — R4586 Emotional lability: Secondary | ICD-10-CM

## 2021-04-25 DIAGNOSIS — F339 Major depressive disorder, recurrent, unspecified: Secondary | ICD-10-CM

## 2021-04-26 NOTE — Telephone Encounter (Signed)
Requested medication (s) are due for refill today: no  Requested medication (s) are on the active medication list: yes  Last refill:  for both meds: 04/03/21  Future visit scheduled: yes  Notes to clinic:  Requesting 90 day refills- sent back to advise if 90 day is appropriate   Requested Prescriptions  Pending Prescriptions Disp Refills   traZODone (DESYREL) 50 MG tablet [Pharmacy Med Name: TRAZODONE 50 MG TABLET] 90 tablet 2    Sig: TAKE 0.5-1 TABLETS BY MOUTH AT BEDTIME AS NEEDED FOR SLEEP.     Psychiatry: Antidepressants - Serotonin Modulator Passed - 04/25/2021 12:35 PM      Passed - Completed PHQ-2 or PHQ-9 in the last 360 days      Passed - Valid encounter within last 6 months    Recent Outpatient Visits           3 weeks ago Mood changes   [facility] Juline Patch, MD   2 months ago Essential (primary) hypertension   [facility] Juline Patch, MD   6 months ago Type 2 diabetes mellitus without complication, without long-term current use of insulin ([place])   [facility] Juline Patch, MD   6 months ago Essential (primary) hypertension   [facility] Juline Patch, MD   9 months ago Post concussion syndrome   [facility] Juline Patch, MD       Future Appointments             In 1 month Juline Patch, MD [facility], [facility]             venlafaxine XR (EFFEXOR-XR) 37.5 MG 24 hr capsule [Pharmacy Med Name: VENLAFAXINE HCL ER 37.5 MG CAP] 90 capsule 1    Sig: TAKE 1 CAPSULE BY MOUTH DAILY WITH BREAKFAST.     Psychiatry: Antidepressants - SNRI - desvenlafaxine & venlafaxine Failed - 04/25/2021 12:35 PM      Failed - Last BP in normal range    BP Readings from Last 1 Encounters:  04/03/21 138/90          Passed - LDL in normal range and within 360 days    LDL Chol Calc (NIH)  Date Value Ref Range Status  05/14/2020 65 0 - 99 mg/dL Final          Passed - Total Cholesterol in normal range  and within 360 days    Cholesterol, Total  Date Value Ref Range Status  05/14/2020 131 100 - 199 mg/dL Final          Passed - Triglycerides in normal range and within 360 days    Triglycerides  Date Value Ref Range Status  05/14/2020 119 0 - 149 mg/dL Final          Passed - Completed PHQ-2 or PHQ-9 in the last 360 days      Passed - Valid encounter within last 6 months    Recent Outpatient Visits           3 weeks ago Mood changes   [facility] Juline Patch, MD   2 months ago Essential (primary) hypertension   [facility] Juline Patch, MD   6 months ago Type 2 diabetes mellitus without complication, without long-term current use of insulin ([place])   [facility] Juline Patch, MD   6 months ago Essential (primary) hypertension   [facility] Juline Patch, MD  9 months ago Post concussion syndrome   [facility] Juline Patch, MD       Future Appointments             In 1 month Juline Patch, MD [facility], [facility]

## 2021-05-13 ENCOUNTER — Ambulatory Visit (INDEPENDENT_AMBULATORY_CARE_PROVIDER_SITE_OTHER): Payer: PPO

## 2021-05-13 DIAGNOSIS — Z0000 Encounter for general adult medical examination without abnormal findings: Secondary | ICD-10-CM

## 2021-05-13 NOTE — Progress Notes (Signed)
Subjective:   Andrew [name] is a 71 y.o. male who presents for Medicare Annual/Subsequent preventive examination.  Virtual Visit via Telephone Note  I connected with  Andrew [name] on 05/13/21 at  9:20 AM EST by telephone and verified that I am speaking with the correct person using two identifiers.  Location: Patient: home Provider: [facility] Persons participating in the virtual visit: [place]   I discussed the limitations, risks, security and privacy concerns of performing an evaluation and management service by telephone and the availability of in person appointments. The patient expressed understanding and agreed to proceed.  Interactive audio and video telecommunications were attempted between this nurse and patient, however failed, due to patient having technical difficulties OR patient did not have access to video capability.  We continued and completed visit with audio only.  Some vital signs may be absent or patient reported.   Andrew Marker, LPN   Review of Systems     Cardiac Risk Factors include: advanced age (>13men, >59 women);diabetes mellitus;dyslipidemia;male gender;hypertension     Objective:    There were no vitals filed for this visit. There is no height or weight on file to calculate BMI.  Advanced Directives 05/13/2021 05/07/2020 05/02/2020 07/13/2019 05/07/2019 12/21/2018 05/03/2018  Does Patient Have a Medical Advance Directive? No No No No Yes No No  Type of Advance Directive - - - - Press photographer;Living will - -  Copy of [name] in Chart? - - - - No - copy requested - -  Would patient like information on creating a medical advance directive? Yes (MAU/Ambulatory/Procedural Areas - Information given) Yes (MAU/Ambulatory/Procedural Areas - Information given) - No - Patient declined - No - Patient declined Yes (MAU/Ambulatory/Procedural Areas - Information given)    Current Medications (verified) Outpatient  Encounter Medications as of 05/13/2021  Medication Sig   amLODipine (NORVASC) 10 MG tablet Take 1 tablet (10 mg total) by mouth daily.   aspirin EC 81 MG tablet Take 81 mg by mouth daily. Swallow whole.   atorvastatin (LIPITOR) 40 MG tablet TAKE 1 TABLET BY MOUTH EVERY DAY   glipiZIDE (GLUCOTROL XL) 2.5 MG 24 hr tablet Take 1 tablet (2.5 mg total) by mouth daily with breakfast.   lisinopril-hydrochlorothiazide (ZESTORETIC) 20-12.5 MG tablet Take 1 tablet by mouth daily.   memantine (NAMENDA) 5 MG tablet Take 1 tablet by mouth daily.   metFORMIN (GLUCOPHAGE) 1000 MG tablet Take 1 tablet (1,000 mg total) by mouth 2 (two) times daily.   Multiple Vitamins-Minerals (CENTRUM SILVER ULTRA MENS PO) Take by mouth.   OneTouch Delica Lancets 44Y MISC TEST ONCE DAILY   ONETOUCH VERIO test strip TEST ONCE DAILY   traZODone (DESYREL) 50 MG tablet TAKE 0.5-1 TABLETS BY MOUTH AT BEDTIME AS NEEDED FOR SLEEP.   venlafaxine XR (EFFEXOR-XR) 150 MG 24 hr capsule Take 1 capsule (150 mg total) by mouth daily with breakfast.   venlafaxine XR (EFFEXOR-XR) 37.5 MG 24 hr capsule TAKE 1 CAPSULE BY MOUTH DAILY WITH BREAKFAST.   vitamin B-12 (CYANOCOBALAMIN) 1000 MCG tablet Take 1,000 mcg by mouth daily. Taking 3 times per week   No facility-administered encounter medications on file as of 05/13/2021.    Allergies (verified) Patient has no known allergies.   History: Past Medical History:  Diagnosis Date   Depression    Diabetes mellitus without complication ([place])    Hyperlipidemia    Hypertension    Past Surgical History:  Procedure Laterality Date  COLONOSCOPY WITH PROPOFOL N/A 07/13/2019   Procedure: COLONOSCOPY WITH PROPOFOL;  Surgeon: [name], MD;  Location: [facility] ENDOSCOPY;  Service: Endoscopy;  Laterality: N/A;   NASAL SINUS SURGERY     REPAIR OF ACUTE ASCENDING THORACIC AORTIC DISSECTION  12/2018   Family History  Problem Relation Age of Onset   Heart disease Mother    Healthy Father    Social  History   Socioeconomic History   Marital status: Married    Spouse name: Not on file   Number of children: 1   Years of education: Not on file   Highest education level: Associate degree: academic program  Occupational History   Occupation: Retired  Tobacco Use   Smoking status: Never   Smokeless tobacco: Never   Tobacco comments:    smoking cessation materials not required  Vaping Use   Vaping Use: Never used  Substance and Sexual Activity   Alcohol use: Not Currently    Alcohol/week: 5.0 standard drinks    Types: 2 Glasses of wine, 3 Cans of beer per week    Comment: weekly   Drug use: No   Sexual activity: Not Currently  Other Topics Concern   Not on file  Social History Narrative   Not on file   Social Determinants of Health   Financial Resource Strain: Low Risk    Difficulty of Paying Living Expenses: Not hard at all  Food Insecurity: No Food Insecurity   Worried About Charity fundraiser in the Last Year: Never true   [place] in the Last Year: Never true  Transportation Needs: No Transportation Needs   Lack of Transportation (Medical): No   Lack of Transportation (Non-Medical): No  Physical Activity: Sufficiently Active   Days of Exercise per Week: 5 days   Minutes of Exercise per Session: 40 min  Stress: No Stress Concern Present   Feeling of Stress : Not at all  Social Connections: Moderately Isolated   Frequency of Communication with Friends and Family: More than three times a week   Frequency of Social Gatherings with Friends and Family: Twice a week   Attends Religious Services: Never   Marine scientist or Organizations: No   Attends Music therapist: Never   Marital Status: Married    Tobacco Counseling Counseling given: Not Answered Tobacco comments: smoking cessation materials not required   Clinical Intake:  Pre-visit preparation completed: Yes  Pain : No/denies pain     Nutritional Risks: None Diabetes:  Yes CBG done?: No Did pt. bring in CBG monitor from home?: No  How often do you need to have someone help you when you read instructions, pamphlets, or other written materials from your doctor or pharmacy?: 1 - Never  Nutrition Risk Assessment:  Has the patient had any N/V/D within the last 2 months?  No  Does the patient have any non-healing wounds?  No  Has the patient had any unintentional weight loss or weight gain?  No   Diabetes:  Is the patient diabetic?  Yes  If diabetic, was a CBG obtained today?  No  Did the patient bring in their glucometer from home?  No  How often do you monitor your CBG's? Pt does not actively check blood sugar.   Financial Strains and Diabetes Management:  Are you having any financial strains with the device, your supplies or your medication? No .  Does the patient want to be seen by Chronic Care Management for management  of their diabetes?  No  Would the patient like to be referred to a Nutritionist or for Diabetic Management?  No   Diabetic Exams:  Diabetic Eye Exam: Completed 08/10/20.   Diabetic Foot Exam: Completed 05/14/20. Pt has been advised about the importance in completing this exam. Pt is scheduled for diabetic foot exam on 06/03/21.    Interpreter Needed?: No  Information entered by :: Andrew Marker LPN   Activities of Daily Living In your present state of health, do you have any difficulty performing the following activities: 05/13/2021  Hearing? N  Vision? N  Difficulty concentrating or making decisions? N  Walking or climbing stairs? N  Dressing or bathing? N  Doing errands, shopping? N  Preparing Food and eating ? N  Using the Toilet? N  In the past six months, have you accidently leaked urine? N  Do you have problems with loss of bowel control? N  Managing your Medications? N  Managing your Finances? N  Housekeeping or managing your Housekeeping? N  Some recent data might be hidden    Patient Care Team: Juline Patch, MD as PCP - General (Family Medicine)  Indicate any recent Medical Services you may have received from other than [facility] providers in the past year (date may be approximate).     Assessment:   This is a routine wellness examination for [place].  Hearing/Vision screen Hearing Screening - Comments:: Pt denies hearing difficulty  Vision Screening - Comments:: [facility] screen[facility]   Dietary issues and exercise activities discussed: Current Exercise Habits: Home exercise routine, Type of exercise: walking, Time (Minutes): 40, Frequency (Times/Week): 5, Weekly Exercise (Minutes/Week): 200, Intensity: Mild, Exercise limited by: None identified   Goals Addressed             This Visit's Progress    DIET - INCREASE WATER INTAKE   On track    Recommend to drink at least 6-8 8oz glasses of water per day.     Patient Stated   On track    Patient states he would like to maintain health with physical activity and healthy eating.        Depression Screen PHQ 2/9 Scores 05/13/2021 04/03/2021 02/03/2021 10/24/2020 07/24/2020 06/26/2020 06/16/2020  PHQ - 2 Score 0 4 0 0 0 0 0  PHQ- 9 Score 0 7 0 0 0 0 0    Fall Risk Fall Risk  05/13/2021 10/24/2020 06/16/2020 05/14/2020 05/07/2020  Falls in the past year? 0 0 1 0 0  Number falls in past yr: 0 0 0 - 0  Comment - - - - -  Injury with Fall? 0 0 1 - 0  Comment - - hit head, and L) leg - -  Risk for fall due to : No Fall Risks No Fall Risks - - No Fall Risks  Follow up Falls prevention discussed Falls evaluation completed - Falls evaluation completed Falls prevention discussed    FALL RISK PREVENTION PERTAINING TO THE HOME:  Any stairs in or around the home? Yes  If so, are there any without handrails? No  Home free of loose throw rugs in walkways, pet beds, electrical cords, etc? Yes  Adequate lighting in your home to reduce risk of falls? Yes   ASSISTIVE DEVICES UTILIZED TO PREVENT FALLS:  Life alert? No  Use of  a cane, walker or w/c? No  Grab bars in the bathroom? Yes  Shower chair or bench in shower? Yes  Elevated toilet seat or a handicapped toilet? Yes   TIMED UP AND GO:  Was the test performed? No . Telephonic visit.   Cognitive Function:  Cognitive status assessed by direct observation. Patient has current diagnosis of mild cognitive impairment. Patient is followed by neurology for ongoing assessment.      6CIT Screen 05/07/2019 05/03/2018 04/28/2017  What Year? 0 points 0 points 0 points  What month? 0 points 0 points 0 points  What time? 0 points 0 points 0 points  Count back from 20 0 points 0 points 0 points  Months in reverse 0 points 0 points 0 points  Repeat phrase 2 points 0 points 0 points  Total Score 2 0 0    Immunizations Immunization History  Administered Date(s) Administered   Influenza, High Dose Seasonal PF 04/28/2017, 05/03/2018, 01/10/2020, 02/06/2021   Influenza,inj,Quad PF,6+ Mos 02/17/2015, 12/27/2018   Influenza-Unspecified 02/17/2015   PFIZER(Purple Top)SARS-COV-2 Vaccination 05/19/2019, 06/09/2019, 01/07/2020, 09/25/2020   Pneumococcal Conjugate-13 04/28/2017   Pneumococcal Polysaccharide-23 05/03/2018   Tdap 05/06/2017    TDAP status: Up to date  Flu Vaccine status: Up to date  Pneumococcal vaccine status: Up to date  Covid-19 vaccine status: Completed vaccines  Qualifies for Shingles Vaccine? Yes   Zostavax completed No   Shingrix Completed?: No.    Education has been provided regarding the importance of this vaccine. Patient has been advised to call insurance company to determine out of pocket expense if they have not yet received this vaccine. Advised may also receive vaccine at local pharmacy or Health Dept. Verbalized acceptance and understanding.  Screening Tests Health Maintenance  Topic Date Due   Zoster Vaccines- Shingrix (1 of 2) Never done   COVID-19 Vaccine (5 - Booster for Pfizer series) 11/20/2020   FOOT EXAM  05/14/2021    OPHTHALMOLOGY EXAM  07/30/2021   HEMOGLOBIN A1C  08/03/2021   TETANUS/TDAP  05/07/2027   COLONOSCOPY (Pts 45-77yrs Insurance coverage will need to be confirmed)  07/12/2029   Pneumonia Vaccine 38+ Years old  Completed   INFLUENZA VACCINE  Completed   HPV VACCINES  Aged Out   Hepatitis C Screening  Discontinued    Health Maintenance  Health Maintenance Due  Topic Date Due   Zoster Vaccines- Shingrix (1 of 2) Never done   COVID-19 Vaccine (5 - Booster for Pfizer series) 11/20/2020    Colorectal cancer screening: Type of screening: Colonoscopy. Completed 07/13/19. Repeat every 10 years  Lung Cancer Screening: (Low Dose CT Chest recommended if Age 79-80 years, 30 pack-year currently smoking OR have quit w/in 15years.) does not qualify.   Additional Screening:  Hepatitis C Screening: does qualify; postponed  Vision Screening: Recommended annual ophthalmology exams for early detection of glaucoma and other disorders of the eye. Is the patient up to date with their annual eye exam?  Yes  Who is the provider or what is the name of the office in which the patient attends annual eye exams? [facility].   Dental Screening: Recommended annual dental exams for proper oral hygiene  Community Resource Referral / Chronic Care Management: CRR required this visit?  No   CCM required this visit?  No      Plan:     I have personally reviewed and noted the following in the patients chart:   Medical and social history Use of alcohol, tobacco or illicit drugs  Current medications and supplements including opioid prescriptions. Patient is not currently taking opioid prescriptions. Functional ability and status Nutritional status  Physical activity Advanced directives List of other physicians Hospitalizations, surgeries, and ER visits in previous 12 months Vitals Screenings to include cognitive, depression, and falls Referrals and appointments  In addition, I have reviewed and  discussed with patient certain preventive protocols, quality metrics, and best practice recommendations. A written personalized care plan for preventive services as well as general preventive health recommendations were provided to patient.     Andrew Marker, LPN   0/30/1499   Nurse Notes: none

## 2021-05-13 NOTE — Patient Instructions (Signed)
Andrew [name] , Thank you for taking time to come for your Medicare Wellness Visit. I appreciate your ongoing commitment to your health goals. Please review the following plan we discussed and let me know if I can assist you in the future.   Screening recommendations/referrals: Colonoscopy: done 07/13/19. Repeat 06/2029 Recommended yearly ophthalmology/optometry visit for glaucoma screening and checkup Recommended yearly dental visit for hygiene and checkup  Vaccinations: Influenza vaccine: done 02/06/21 Pneumococcal vaccine: done 05/03/18 Tdap vaccine: done 05/06/17 Shingles vaccine: Shingrix discussed. Please contact your pharmacy for coverage information.  Covid-19:  done 05/19/19, 06/09/19, 01/07/20 & 09/25/20  Advanced directives: Advance directive discussed with you today. I have provided a copy for you to complete at home and have notarized. Once this is complete please bring a copy in to our office so we can scan it into your chart.   Conditions/risks identified: Keep up the great work!  Next appointment: Follow up in one year for your annual wellness visit.   Preventive Care 70 Years and Older, Male Preventive care refers to lifestyle choices and visits with your health care provider that can promote health and wellness. What does preventive care include? A yearly physical exam. This is also called an annual well check. Dental exams once or twice a year. Routine eye exams. Ask your health care provider how often you should have your eyes checked. Personal lifestyle choices, including: Daily care of your teeth and gums. Regular physical activity. Eating a healthy diet. Avoiding tobacco and drug use. Limiting alcohol use. Practicing safe sex. Taking low doses of aspirin every day. Taking vitamin and mineral supplements as recommended by your health care provider. What happens during an annual well check? The services and screenings done by your health care provider during your  annual well check will depend on your age, overall health, lifestyle risk factors, and family history of disease. Counseling  Your health care provider may ask you questions about your: Alcohol use. Tobacco use. Drug use. Emotional well-being. Home and relationship well-being. Sexual activity. Eating habits. History of falls. Memory and ability to understand (cognition). Work and work Statistician. Screening  You may have the following tests or measurements: Height, weight, and BMI. Blood pressure. Lipid and cholesterol levels. These may be checked every 5 years, or more frequently if you are over 70 years old. Skin check. Lung cancer screening. You may have this screening every year starting at age 70 if you have a 30-pack-year history of smoking and currently smoke or have quit within the past 15 years. Fecal occult blood test (FOBT) of the stool. You may have this test every year starting at age 70. Flexible sigmoidoscopy or colonoscopy. You may have a sigmoidoscopy every 5 years or a colonoscopy every 10 years starting at age 70. Prostate cancer screening. Recommendations will vary depending on your family history and other risks. Hepatitis C blood test. Hepatitis B blood test. Sexually transmitted disease (STD) testing. Diabetes screening. This is done by checking your blood sugar (glucose) after you have not eaten for a while (fasting). You may have this done every 1-3 years. Abdominal aortic aneurysm (AAA) screening. You may need this if you are a current or former smoker. Osteoporosis. You may be screened starting at age 70. if you are at high risk. Talk with your health care provider about your test results, treatment options, and if necessary, the need for more tests. Vaccines  Your health care provider may recommend certain vaccines, such as: Influenza vaccine. This is recommended  every year. Tetanus, diphtheria, and acellular pertussis (Tdap, Td) vaccine. You may need a Td  booster every 10 years. Zoster vaccine. You may need this after age 70. Pneumococcal 13-valent conjugate (PCV13) vaccine. One dose is recommended after age 70. Pneumococcal polysaccharide (PPSV23) vaccine. One dose is recommended after age 70. Talk to your health care provider about which screenings and vaccines you need and how often you need them. This information is not intended to replace advice given to you by your health care provider. Make sure you discuss any questions you have with your health care provider. Document Released: 04/11/2015 Document Revised: 12/03/2015 Document Reviewed: 01/14/2015 Elsevier Interactive Patient Education  2017 [place] Prevention in the Home Falls can cause injuries. They can happen to people of all ages. There are many things you can do to make your home safe and to help prevent falls. What can I do on the outside of my home? Regularly fix the edges of walkways and driveways and fix any cracks. Remove anything that might make you trip as you walk through a door, such as a raised step or threshold. Trim any bushes or trees on the path to your home. Use bright outdoor lighting. Clear any walking paths of anything that might make someone trip, such as rocks or tools. Regularly check to see if handrails are loose or broken. Make sure that both sides of any steps have handrails. Any raised decks and porches should have guardrails on the edges. Have any leaves, snow, or ice cleared regularly. Use sand or salt on walking paths during winter. Clean up any spills in your garage right away. This includes oil or grease spills. What can I do in the bathroom? Use night lights. Install grab bars by the toilet and in the tub and shower. Do not use towel bars as grab bars. Use non-skid mats or decals in the tub or shower. If you need to sit down in the shower, use a plastic, non-slip stool. Keep the floor dry. Clean up any water that spills on the floor  as soon as it happens. Remove soap buildup in the tub or shower regularly. Attach bath mats securely with double-sided non-slip rug tape. Do not have throw rugs and other things on the floor that can make you trip. What can I do in the bedroom? Use night lights. Make sure that you have a light by your bed that is easy to reach. Do not use any sheets or blankets that are too big for your bed. They should not hang down onto the floor. Have a firm chair that has side arms. You can use this for support while you get dressed. Do not have throw rugs and other things on the floor that can make you trip. What can I do in the kitchen? Clean up any spills right away. Avoid walking on wet floors. Keep items that you use a lot in easy-to-reach places. If you need to reach something above you, use a strong step stool that has a grab bar. Keep electrical cords out of the way. Do not use floor polish or wax that makes floors slippery. If you must use wax, use non-skid floor wax. Do not have throw rugs and other things on the floor that can make you trip. What can I do with my stairs? Do not leave any items on the stairs. Make sure that there are handrails on both sides of the stairs and use them. Fix handrails that are broken  or loose. Make sure that handrails are as long as the stairways. Check any carpeting to make sure that it is firmly attached to the stairs. Fix any carpet that is loose or worn. Avoid having throw rugs at the top or bottom of the stairs. If you do have throw rugs, attach them to the floor with carpet tape. Make sure that you have a light switch at the top of the stairs and the bottom of the stairs. If you do not have them, ask someone to add them for you. What else can I do to help prevent falls? Wear shoes that: Do not have high heels. Have rubber bottoms. Are comfortable and fit you well. Are closed at the toe. Do not wear sandals. If you use a stepladder: Make sure that it is  fully opened. Do not climb a closed stepladder. Make sure that both sides of the stepladder are locked into place. Ask someone to hold it for you, if possible. Clearly mark and make sure that you can see: Any grab bars or handrails. First and last steps. Where the edge of each step is. Use tools that help you move around (mobility aids) if they are needed. These include: Canes. Walkers. Scooters. Crutches. Turn on the lights when you go into a dark area. Replace any light bulbs as soon as they burn out. Set up your furniture so you have a clear path. Avoid moving your furniture around. If any of your floors are uneven, fix them. If there are any pets around you, be aware of where they are. Review your medicines with your doctor. Some medicines can make you feel dizzy. This can increase your chance of falling. Ask your doctor what other things that you can do to help prevent falls. This information is not intended to replace advice given to you by your health care provider. Make sure you discuss any questions you have with your health care provider. Document Released: 01/09/2009 Document Revised: 08/21/2015 Document Reviewed: 04/19/2014 Elsevier Interactive Patient Education  2017 [employer].

## 2021-05-26 ENCOUNTER — Ambulatory Visit: Payer: PPO | Admitting: Urology

## 2021-06-03 ENCOUNTER — Encounter: Payer: Self-pay | Admitting: Family Medicine

## 2021-06-03 ENCOUNTER — Ambulatory Visit (INDEPENDENT_AMBULATORY_CARE_PROVIDER_SITE_OTHER): Payer: PPO | Admitting: Family Medicine

## 2021-06-03 ENCOUNTER — Other Ambulatory Visit: Payer: Self-pay

## 2021-06-03 VITALS — BP 130/70 | HR 80 | Ht 73.0 in | Wt 182.0 lb

## 2021-06-03 DIAGNOSIS — F339 Major depressive disorder, recurrent, unspecified: Secondary | ICD-10-CM | POA: Diagnosis not present

## 2021-06-03 DIAGNOSIS — R4586 Emotional lability: Secondary | ICD-10-CM | POA: Diagnosis not present

## 2021-06-03 DIAGNOSIS — E119 Type 2 diabetes mellitus without complications: Secondary | ICD-10-CM

## 2021-06-03 MED ORDER — GLIPIZIDE ER 2.5 MG PO TB24: 2.5000 mg | ORAL_TABLET | Freq: Every day | ORAL | 1 refills | Status: DC

## 2021-06-03 MED ORDER — METFORMIN HCL 1000 MG PO TABS: 1000.0000 mg | ORAL_TABLET | Freq: Two times a day (BID) | ORAL | 1 refills | Status: DC

## 2021-06-03 MED ORDER — VENLAFAXINE HCL ER 150 MG PO CP24: 150.0000 mg | ORAL_CAPSULE | Freq: Every day | ORAL | 1 refills | Status: DC

## 2021-06-03 MED ORDER — VENLAFAXINE HCL ER 37.5 MG PO CP24: 37.5000 mg | ORAL_CAPSULE | Freq: Every day | ORAL | 1 refills | Status: DC

## 2021-06-03 NOTE — Progress Notes (Signed)
Date:  06/03/2021   Name:  Andrew [name]   DOB:  03-11-1951   MRN:  [id number]   Chief Complaint: Depression and Diabetes  Depression        This is a chronic problem.  The current episode started more than 1 year ago.   The onset quality is sudden.   The problem occurs constantly.  The problem has been gradually improving since onset.  Associated symptoms include no decreased concentration, no fatigue, no helplessness, no hopelessness, does not have insomnia, not irritable, no restlessness, no decreased interest, no appetite change, no body aches, no myalgias, no headaches, no indigestion, not sad and no suicidal ideas.     The symptoms are aggravated by medication withdrawal.  Past treatments include SNRIs - Serotonin and norepinephrine reuptake inhibitors.  Compliance with treatment is good.  Previous treatment provided moderate relief. Diabetes He presents for his follow-up diabetic visit. His disease course has been stable. There are no hypoglycemic associated symptoms. Pertinent negatives for hypoglycemia include no dizziness, headaches or nervousness/anxiousness. There are no diabetic associated symptoms. Pertinent negatives for diabetes include no blurred vision, no chest pain, no fatigue, no polydipsia, no polyuria and no weight loss. There are no hypoglycemic complications. Symptoms are stable. Current diabetic treatment includes oral agent (dual therapy). He is compliant with treatment all of the time. His weight is stable. He is following a generally healthy diet. Meal planning includes avoidance of concentrated sweets and carbohydrate counting. His breakfast blood glucose is taken between 8-9 am. An ACE inhibitor/angiotensin II receptor blocker is being taken. He does not see a podiatrist.Eye exam is not current.   Lab Results  Component Value Date   NA 142 02/03/2021   K 4.9 02/03/2021   CO2 26 02/03/2021   GLUCOSE 110 (H) 02/03/2021   BUN 28 (H) 02/03/2021   CREATININE 1.14  02/03/2021   CALCIUM 9.8 02/03/2021   EGFR 70 02/03/2021   GFRNONAA >60 05/02/2020   Lab Results  Component Value Date   CHOL 131 05/14/2020   HDL 45 05/14/2020   LDLCALC 65 05/14/2020   TRIG 119 05/14/2020   CHOLHDL 3.3 07/31/2018   No results found for: TSH Lab Results  Component Value Date   HGBA1C 5.9 (H) 02/03/2021   Lab Results  Component Value Date   WBC 6.2 05/02/2020   HGB 16.1 05/02/2020   HCT 46.5 05/02/2020   MCV 95.1 05/02/2020   PLT 300 05/02/2020   No results found for: ALT, AST, GGT, ALKPHOS, BILITOT No results found for: 25OHVITD2, 25OHVITD3, VD25OH   Review of Systems  Constitutional:  Negative for appetite change, chills, fatigue, fever and weight loss.  HENT:  Negative for drooling, ear discharge, ear pain and sore throat.   Eyes:  Negative for blurred vision.  Respiratory:  Negative for cough, shortness of breath and wheezing.   Cardiovascular:  Negative for chest pain, palpitations and leg swelling.  Gastrointestinal:  Negative for abdominal pain, blood in stool, constipation, diarrhea and nausea.  Endocrine: Negative for polydipsia and polyuria.  Genitourinary:  Negative for dysuria, frequency, hematuria and urgency.  Musculoskeletal:  Negative for back pain, myalgias and neck pain.  Skin:  Negative for rash.  Allergic/Immunologic: Negative for environmental allergies.  Neurological:  Negative for dizziness and headaches.  Hematological:  Does not bruise/bleed easily.  Psychiatric/Behavioral:  Positive for depression. Negative for decreased concentration and suicidal ideas. The patient is not nervous/anxious and does not have insomnia.    Patient  Active Problem List   Diagnosis Date Noted   Aortic atherosclerosis ([place]) 05/02/2020   Screening for colon cancer    Aortic dissection proximal to innominate 12/23/2018   Mixed hyperlipidemia 10/05/2016   Familial multiple lipoprotein-type hyperlipidemia 08/02/2014   Routine general medical  examination at a health care facility 08/02/2014   Recurrent major depressive episodes ([place]) 08/02/2014   Essential (primary) hypertension 08/02/2014   Overweight 08/02/2014   Diabetes mellitus, type 2 ([place]) 08/02/2014    No Known Allergies  Past Surgical History:  Procedure Laterality Date   COLONOSCOPY WITH PROPOFOL N/A 07/13/2019   Procedure: COLONOSCOPY WITH PROPOFOL;  Surgeon: [name], MD;  Location: [facility] ENDOSCOPY;  Service: Endoscopy;  Laterality: N/A;   NASAL SINUS SURGERY     REPAIR OF ACUTE ASCENDING THORACIC AORTIC DISSECTION  12/2018    Social History   Tobacco Use   Smoking status: Never   Smokeless tobacco: Never   Tobacco comments:    smoking cessation materials not required  Vaping Use   Vaping Use: Never used  Substance Use Topics   Alcohol use: Not Currently    Alcohol/week: 5.0 standard drinks    Types: 2 Glasses of wine, 3 Cans of beer per week    Comment: weekly   Drug use: No     Medication list has been reviewed and updated.  Current Meds  Medication Sig   amLODipine (NORVASC) 10 MG tablet Take 1 tablet (10 mg total) by mouth daily.   aspirin EC 81 MG tablet Take 81 mg by mouth daily. Swallow whole.   atorvastatin (LIPITOR) 40 MG tablet TAKE 1 TABLET BY MOUTH EVERY DAY   glipiZIDE (GLUCOTROL XL) 2.5 MG 24 hr tablet Take 1 tablet (2.5 mg total) by mouth daily with breakfast.   lisinopril-hydrochlorothiazide (ZESTORETIC) 20-12.5 MG tablet Take 1 tablet by mouth daily.   memantine (NAMENDA) 5 MG tablet Take 1 tablet by mouth daily.   metFORMIN (GLUCOPHAGE) 1000 MG tablet Take 1 tablet (1,000 mg total) by mouth 2 (two) times daily.   Multiple Vitamins-Minerals (CENTRUM SILVER ULTRA MENS PO) Take by mouth.   OneTouch Delica Lancets 74B MISC TEST ONCE DAILY   ONETOUCH VERIO test strip TEST ONCE DAILY   traZODone (DESYREL) 50 MG tablet TAKE 0.5-1 TABLETS BY MOUTH AT BEDTIME AS NEEDED FOR SLEEP.   venlafaxine XR (EFFEXOR-XR) 150 MG 24 hr capsule  Take 1 capsule (150 mg total) by mouth daily with breakfast.   venlafaxine XR (EFFEXOR-XR) 37.5 MG 24 hr capsule TAKE 1 CAPSULE BY MOUTH DAILY WITH BREAKFAST.   vitamin B-12 (CYANOCOBALAMIN) 1000 MCG tablet Take 1,000 mcg by mouth daily. Taking 3 times per week    PHQ 2/9 Scores 06/03/2021 05/13/2021 04/03/2021 02/03/2021  PHQ - 2 Score 0 0 4 0  PHQ- 9 Score 0 0 7 0    GAD 7 : Generalized Anxiety Score 06/03/2021 04/03/2021 02/03/2021 10/24/2020  Nervous, Anxious, on Edge 0 1 0 0  Control/stop worrying 0 2 0 0  Worry too much - different things 0 3 0 0  Trouble relaxing 0 0 0 0  Restless 0 0 0 0  Easily annoyed or irritable 0 2 0 0  Afraid - awful might happen 0 0 0 0  Total GAD 7 Score 0 8 0 0  Anxiety Difficulty Not difficult at all Somewhat difficult - -    BP Readings from Last 3 Encounters:  06/03/21 130/70  04/03/21 138/90  02/03/21 130/86    Physical Exam Vitals  and nursing note reviewed.  Constitutional:      General: He is not irritable. HENT:     Head: Normocephalic.     Right Ear: Tympanic membrane and external ear normal. There is no impacted cerumen.     Left Ear: Tympanic membrane and external ear normal. There is no impacted cerumen.     Nose: Nose normal. No congestion or rhinorrhea.  Eyes:     General: No scleral icterus.       Right eye: No discharge.        Left eye: No discharge.     Conjunctiva/sclera: Conjunctivae normal.     Pupils: Pupils are equal, round, and reactive to light.  Neck:     Thyroid: No thyromegaly.     Vascular: No JVD.     Trachea: No tracheal deviation.  Cardiovascular:     Rate and Rhythm: Normal rate and regular rhythm.     Heart sounds: Normal heart sounds. No murmur heard.   No friction rub. No gallop.  Pulmonary:     Effort: No respiratory distress.     Breath sounds: Normal breath sounds. No wheezing, rhonchi or rales.  Abdominal:     General: Bowel sounds are normal.     Palpations: Abdomen is soft. There is no mass.      Tenderness: There is no abdominal tenderness. There is no guarding or rebound.  Musculoskeletal:        General: No tenderness. Normal range of motion.     Cervical back: Normal range of motion and neck supple.  Lymphadenopathy:     Cervical: No cervical adenopathy.  Skin:    General: Skin is warm.     Findings: No rash.  Neurological:     Mental Status: He is alert and oriented to person, place, and time.     Cranial Nerves: No cranial nerve deficit.     Deep Tendon Reflexes: Reflexes are normal and symmetric.    Wt Readings from Last 3 Encounters:  06/03/21 182 lb (82.6 kg)  04/03/21 186 lb (84.4 kg)  02/03/21 178 lb (80.7 kg)    BP 130/70    Pulse 80    Ht 6' 1" (1.854 m)    Wt 182 lb (82.6 kg)    BMI 24.01 kg/m   Assessment and Plan:  1. Type 2 diabetes mellitus without complication, without long-term current use of insulin (HCC) Chronic.  Controlled.  Stable.  Patient is on double therapy of glipizide XL 2.5 mg and metformin 1 g twice a day.  Will check A1c and microalbuminuria. - glipiZIDE (GLUCOTROL XL) 2.5 MG 24 hr tablet; Take 1 tablet (2.5 mg total) by mouth daily with breakfast.  Dispense: 90 tablet; Refill: 1 - metFORMIN (GLUCOPHAGE) 1000 MG tablet; Take 1 tablet (1,000 mg total) by mouth 2 (two) times daily.  Dispense: 180 tablet; Refill: 1 - HgB A1c - Microalbumin, urine  2. Recurrent major depressive episodes (HCC) Chronic.  Controlled.  Stable.  PHQ 2 was 0.  Gad score is 0.  Significant improvement with addition of 37.5 mg to his 150 mg.  So he still within therapeutic range and we will continue on this current dosing.  We will check in 6 months for progression of depression and anxiety. - venlafaxine XR (EFFEXOR-XR) 150 MG 24 hr capsule; Take 1 capsule (150 mg total) by mouth daily with breakfast.  Dispense: 90 capsule; Refill: 1 - venlafaxine XR (EFFEXOR-XR) 37.5 MG 24 hr capsule; Take 1 capsule (37.5  mg total) by mouth daily with breakfast.  Dispense: 90  capsule; Refill: 1  3. Mood changes Controlled.  There is been significant improvement of the baseline mood changes of the patient that he is not as volatile at the addition of 37 and half milligrams along with the 150 base of venlafaxine XR. - venlafaxine XR (EFFEXOR-XR) 37.5 MG 24 hr capsule; Take 1 capsule (37.5 mg total) by mouth daily with breakfast.  Dispense: 90 capsule; Refill: 1

## 2021-06-04 LAB — HEMOGLOBIN A1C
Est. average glucose Bld gHb Est-mCnc: 140 mg/dL
Hgb A1c MFr Bld: 6.5 % — ABNORMAL HIGH (ref 4.8–5.6)

## 2021-06-04 LAB — MICROALBUMIN, URINE: Microalbumin, Urine: 219.6 ug/mL

## 2021-06-26 ENCOUNTER — Other Ambulatory Visit: Payer: Self-pay

## 2021-06-26 ENCOUNTER — Ambulatory Visit: Payer: PPO | Attending: Internal Medicine

## 2021-06-26 DIAGNOSIS — Z23 Encounter for immunization: Secondary | ICD-10-CM

## 2021-06-26 MED ORDER — PFIZER COVID-19 VAC BIVALENT 30 MCG/0.3ML IM SUSP
INTRAMUSCULAR | 0 refills | Status: DC
  Filled 2021-06-26: qty 0.3, 1d supply, fill #0

## 2021-07-05 ENCOUNTER — Other Ambulatory Visit: Payer: Self-pay | Admitting: Family Medicine

## 2021-07-05 DIAGNOSIS — E7801 Familial hypercholesterolemia: Secondary | ICD-10-CM

## 2021-07-05 DIAGNOSIS — I10 Essential (primary) hypertension: Secondary | ICD-10-CM

## 2021-07-06 NOTE — Telephone Encounter (Signed)
Requested medications are due for refill today.  yes ? ?Requested medications are on the active medications list.  yes ? ?Last refill. 04/07/2021 #90 0 refills ? ?Future visit scheduled.   yes ? ?Notes to clinic.  Failed protocol d/t expired labs. ? ? ? ?Requested Prescriptions  ?Pending Prescriptions Disp Refills  ? atorvastatin (LIPITOR) 40 MG tablet [Pharmacy Med Name: ATORVASTATIN 40 MG TABLET] 90 tablet 0  ?  Sig: TAKE 1 TABLET BY MOUTH EVERY DAY  ?  ? Cardiovascular:  Antilipid - Statins Failed - 07/05/2021  1:14 AM  ?  ?  Failed - Lipid Panel in normal range within the last 12 months  ?  Cholesterol, Total  ?Date Value Ref Range Status  ?05/14/2020 131 100 - 199 mg/dL Final  ? ?LDL Chol Calc (NIH)  ?Date Value Ref Range Status  ?05/14/2020 65 0 - 99 mg/dL Final  ? ?HDL  ?Date Value Ref Range Status  ?05/14/2020 45 >39 mg/dL Final  ? ?Triglycerides  ?Date Value Ref Range Status  ?05/14/2020 119 0 - 149 mg/dL Final  ? ?  ?  ?  Passed - Patient is not pregnant  ?  ?  Passed - Valid encounter within last 12 months  ?  Recent Outpatient Visits   ? ?      ? 1 month ago Type 2 diabetes mellitus without complication, without long-term current use of insulin ([place])  ? [facility] Juline Patch, MD  ? 3 months ago Mood changes  ? [facility] Juline Patch, MD  ? 5 months ago Essential (primary) hypertension  ? [facility] Juline Patch, MD  ? 8 months ago Type 2 diabetes mellitus without complication, without long-term current use of insulin ([place])  ? [facility] Juline Patch, MD  ? 9 months ago Essential (primary) hypertension  ? [facility] Juline Patch, MD  ? ?  ?  ?Future Appointments   ? ?        ? In 3 months Juline Patch, MD [facility] [place]  ? ?  ? ?  ?  ?  ?Signed Prescriptions Disp Refills  ? amLODipine (NORVASC) 10 MG tablet 90 tablet 0  ?  Sig: TAKE 1 TABLET BY MOUTH EVERY DAY  ?  ? Cardiovascular: Calcium Channel Blockers 2 Passed -  07/05/2021  1:14 AM  ?  ?  Passed - Last BP in normal range  ?  BP Readings from Last 1 Encounters:  ?06/03/21 130/70  ?  ?  ?  ?  Passed - Last Heart Rate in normal range  ?  Pulse Readings from Last 1 Encounters:  ?06/03/21 80  ?  ?  ?  ?  Passed - Valid encounter within last 6 months  ?  Recent Outpatient Visits   ? ?      ? 1 month ago Type 2 diabetes mellitus without complication, without long-term current use of insulin ([place])  ? [facility] Juline Patch, MD  ? 3 months ago Mood changes  ? [facility] Juline Patch, MD  ? 5 months ago Essential (primary) hypertension  ? [facility] Juline Patch, MD  ? 8 months ago Type 2 diabetes mellitus without complication, without long-term current use of insulin ([place])  ? [facility] Juline Patch, MD  ? 9 months ago Essential (primary) hypertension  ? [facility] Juline Patch,  MD  ? ?  ?  ?Future Appointments   ? ?        ? In 3 months Juline Patch, MD [facility] [place]  ? ?  ? ?  ?  ?  ?  ?

## 2021-07-06 NOTE — Telephone Encounter (Signed)
Requested Prescriptions  ?Pending Prescriptions Disp Refills  ?? atorvastatin (LIPITOR) 40 MG tablet [Pharmacy Med Name: ATORVASTATIN 40 MG TABLET] 90 tablet 0  ?  Sig: TAKE 1 TABLET BY MOUTH EVERY DAY  ?  ? Cardiovascular:  Antilipid - Statins Failed - 07/05/2021  1:14 AM  ?  ?  Failed - Lipid Panel in normal range within the last 12 months  ?  Cholesterol, Total  ?Date Value Ref Range Status  ?05/14/2020 131 100 - 199 mg/dL Final  ? ?LDL Chol Calc (NIH)  ?Date Value Ref Range Status  ?05/14/2020 65 0 - 99 mg/dL Final  ? ?HDL  ?Date Value Ref Range Status  ?05/14/2020 45 >39 mg/dL Final  ? ?Triglycerides  ?Date Value Ref Range Status  ?05/14/2020 119 0 - 149 mg/dL Final  ? ?  ?  ?  Passed - Patient is not pregnant  ?  ?  Passed - Valid encounter within last 12 months  ?  Recent Outpatient Visits   ?      ? 1 month ago Type 2 diabetes mellitus without complication, without long-term current use of insulin ([place])  ? [facility] Juline Patch, MD  ? 3 months ago Mood changes  ? [facility] Juline Patch, MD  ? 5 months ago Essential (primary) hypertension  ? [facility] Juline Patch, MD  ? 8 months ago Type 2 diabetes mellitus without complication, without long-term current use of insulin ([place])  ? [facility] Juline Patch, MD  ? 9 months ago Essential (primary) hypertension  ? [facility] Juline Patch, MD  ?  ?  ?Future Appointments   ?        ? In 3 months Juline Patch, MD [facility]  ?  ? ?  ?  ?  ?? amLODipine (NORVASC) 10 MG tablet [Pharmacy Med Name: AMLODIPINE BESYLATE 10 MG TAB] 90 tablet 0  ?  Sig: TAKE 1 TABLET BY MOUTH EVERY DAY  ?  ? Cardiovascular: Calcium Channel Blockers 2 Passed - 07/05/2021  1:14 AM  ?  ?  Passed - Last BP in normal range  ?  BP Readings from Last 1 Encounters:  ?06/03/21 130/70  ?   ?  ?  Passed - Last Heart Rate in normal range  ?  Pulse Readings from Last 1 Encounters:  ?06/03/21 80  ?   ?  ?  Passed -  Valid encounter within last 6 months  ?  Recent Outpatient Visits   ?      ? 1 month ago Type 2 diabetes mellitus without complication, without long-term current use of insulin ([place])  ? [facility] Juline Patch, MD  ? 3 months ago Mood changes  ? [facility] Juline Patch, MD  ? 5 months ago Essential (primary) hypertension  ? [facility] Juline Patch, MD  ? 8 months ago Type 2 diabetes mellitus without complication, without long-term current use of insulin ([place])  ? [facility] Juline Patch, MD  ? 9 months ago Essential (primary) hypertension  ? [facility] Juline Patch, MD  ?  ?  ?Future Appointments   ?        ? In 3 months Juline Patch, MD [facility], [place]  ?  ? ?  ?  ?  ? ?

## 2021-07-14 DIAGNOSIS — C44212 Basal cell carcinoma of skin of right ear and external auricular canal: Secondary | ICD-10-CM | POA: Diagnosis not present

## 2021-07-14 DIAGNOSIS — D485 Neoplasm of uncertain behavior of skin: Secondary | ICD-10-CM | POA: Diagnosis not present

## 2021-07-14 DIAGNOSIS — C44222 Squamous cell carcinoma of skin of right ear and external auricular canal: Secondary | ICD-10-CM | POA: Diagnosis not present

## 2021-07-21 ENCOUNTER — Other Ambulatory Visit: Payer: Self-pay | Admitting: Family Medicine

## 2021-07-21 DIAGNOSIS — F5101 Primary insomnia: Secondary | ICD-10-CM

## 2021-08-03 DIAGNOSIS — E119 Type 2 diabetes mellitus without complications: Secondary | ICD-10-CM | POA: Diagnosis not present

## 2021-08-03 LAB — HM DIABETES EYE EXAM

## 2021-09-17 ENCOUNTER — Telehealth: Payer: Self-pay | Admitting: Family Medicine

## 2021-09-17 NOTE — Telephone Encounter (Signed)
Copied from [place]. Topic: General - Inquiry >> Sep 17, 2021  2:19 PM Marcellus Scott wrote: Reason for CRM: Pt stated he would like to speak with Baxter Flattery. Declined to speak with any other nurse or to provide further details requested to please get a call back from [place].

## 2021-10-06 DIAGNOSIS — Z85828 Personal history of other malignant neoplasm of skin: Secondary | ICD-10-CM | POA: Insufficient documentation

## 2021-10-06 DIAGNOSIS — C44212 Basal cell carcinoma of skin of right ear and external auricular canal: Secondary | ICD-10-CM | POA: Diagnosis not present

## 2021-10-06 DIAGNOSIS — C44222 Squamous cell carcinoma of skin of right ear and external auricular canal: Secondary | ICD-10-CM | POA: Diagnosis not present

## 2021-10-07 ENCOUNTER — Encounter: Payer: Self-pay | Admitting: Family Medicine

## 2021-10-07 ENCOUNTER — Ambulatory Visit (INDEPENDENT_AMBULATORY_CARE_PROVIDER_SITE_OTHER): Payer: PPO | Admitting: Family Medicine

## 2021-10-07 VITALS — BP 139/70 | HR 66 | Ht 73.0 in | Wt 174.0 lb

## 2021-10-07 DIAGNOSIS — R4586 Emotional lability: Secondary | ICD-10-CM

## 2021-10-07 DIAGNOSIS — E119 Type 2 diabetes mellitus without complications: Secondary | ICD-10-CM | POA: Diagnosis not present

## 2021-10-07 DIAGNOSIS — F5101 Primary insomnia: Secondary | ICD-10-CM

## 2021-10-07 DIAGNOSIS — I10 Essential (primary) hypertension: Secondary | ICD-10-CM

## 2021-10-07 DIAGNOSIS — E7801 Familial hypercholesterolemia: Secondary | ICD-10-CM

## 2021-10-07 DIAGNOSIS — F339 Major depressive disorder, recurrent, unspecified: Secondary | ICD-10-CM | POA: Diagnosis not present

## 2021-10-07 MED ORDER — VENLAFAXINE HCL ER 150 MG PO CP24: 150.0000 mg | ORAL_CAPSULE | Freq: Every day | ORAL | 1 refills | Status: DC

## 2021-10-07 MED ORDER — ATORVASTATIN CALCIUM 40 MG PO TABS: 40.0000 mg | ORAL_TABLET | Freq: Every day | ORAL | 1 refills | Status: DC

## 2021-10-07 MED ORDER — AMLODIPINE BESYLATE 10 MG PO TABS: 10.0000 mg | ORAL_TABLET | Freq: Every day | ORAL | 1 refills | Status: DC

## 2021-10-07 MED ORDER — VENLAFAXINE HCL ER 37.5 MG PO CP24: 37.5000 mg | ORAL_CAPSULE | Freq: Every day | ORAL | 1 refills | Status: DC

## 2021-10-07 MED ORDER — METFORMIN HCL 1000 MG PO TABS: 1000.0000 mg | ORAL_TABLET | Freq: Two times a day (BID) | ORAL | 1 refills | Status: DC

## 2021-10-07 MED ORDER — GLIPIZIDE ER 2.5 MG PO TB24: 2.5000 mg | ORAL_TABLET | Freq: Every day | ORAL | 1 refills | Status: DC

## 2021-10-07 MED ORDER — TRAZODONE HCL 50 MG PO TABS: 25.0000 mg | ORAL_TABLET | Freq: Every evening | ORAL | 1 refills | Status: DC | PRN

## 2021-10-07 NOTE — Progress Notes (Signed)
Date:  10/07/2021   Name:  Andrew [name]   DOB:  1950/12/22   MRN:  [id number]   Chief Complaint: Diabetes (Foot exam needed), Depression, Insomnia, Hypertension, and Hyperlipidemia  Diabetes He presents for his follow-up diabetic visit. He has type 2 diabetes mellitus. His disease course has been stable. There are no hypoglycemic associated symptoms. Pertinent negatives for hypoglycemia include no headaches or sweats. There are no diabetic associated symptoms. Pertinent negatives for diabetes include no blurred vision, no chest pain, no fatigue, no foot paresthesias, no foot ulcerations, no polydipsia, no polyuria, no visual change, no weakness and no weight loss. There are no hypoglycemic complications. Symptoms are stable. There are no diabetic complications. There are no known risk factors for coronary artery disease. Current diabetic treatment includes oral agent (dual therapy). He is compliant with treatment all of the time. He is following a generally healthy diet. Meal planning includes avoidance of concentrated sweets and carbohydrate counting. He never participates in exercise. An ACE inhibitor/angiotensin II receptor blocker is not being taken. Eye exam is not current.  Depression        This is a chronic problem.  The current episode started more than 1 year ago.   Associated symptoms include no fatigue, no helplessness, no hopelessness, does not have insomnia, not irritable, no restlessness, no decreased interest, no body aches, no headaches, not sad and no suicidal ideas.  Past treatments include SNRIs - Serotonin and norepinephrine reuptake inhibitors.  Previous treatment provided moderate relief. Insomnia Primary symptoms: no fragmented sleep, no sleep disturbance, no difficulty falling asleep, no somnolence, no frequent awakening, no premature morning awakening, no malaise/fatigue, no napping.   PMH includes: depression.   Hypertension This is a chronic problem. The current  episode started more than 1 year ago. Pertinent negatives include no blurred vision, chest pain, headaches, malaise/fatigue, orthopnea, palpitations, PND or sweats. Past treatments include calcium channel blockers. The current treatment provides moderate improvement. There are no compliance problems.   Hyperlipidemia This is a chronic problem. The current episode started more than 1 year ago. The problem is controlled. Pertinent negatives include no chest pain. Current antihyperlipidemic treatment includes statins. The current treatment provides moderate improvement of lipids.    Lab Results  Component Value Date   NA 142 02/03/2021   K 4.9 02/03/2021   CO2 26 02/03/2021   GLUCOSE 110 (H) 02/03/2021   BUN 28 (H) 02/03/2021   CREATININE 1.14 02/03/2021   CALCIUM 9.8 02/03/2021   EGFR 70 02/03/2021   GFRNONAA >60 05/02/2020   Lab Results  Component Value Date   CHOL 131 05/14/2020   HDL 45 05/14/2020   LDLCALC 65 05/14/2020   TRIG 119 05/14/2020   CHOLHDL 3.3 07/31/2018   No results found for: "TSH" Lab Results  Component Value Date   HGBA1C 6.5 (H) 06/03/2021   Lab Results  Component Value Date   WBC 6.2 05/02/2020   HGB 16.1 05/02/2020   HCT 46.5 05/02/2020   MCV 95.1 05/02/2020   PLT 300 05/02/2020   No results found for: "ALT", "AST", "GGT", "ALKPHOS", "BILITOT" No results found for: "25OHVITD2", "25OHVITD3", "VD25OH"   Review of Systems  Constitutional:  Negative for fatigue, malaise/fatigue and weight loss.  Eyes:  Negative for blurred vision.  Cardiovascular:  Negative for chest pain, palpitations, orthopnea and PND.  Endocrine: Negative for polydipsia and polyuria.  Neurological:  Negative for weakness and headaches.  Psychiatric/Behavioral:  Positive for depression. Negative for sleep disturbance and suicidal  ideas. The patient does not have insomnia.     Patient Active Problem List   Diagnosis Date Noted   Aortic atherosclerosis ([place]) 05/02/2020    Screening for colon cancer    Aortic dissection proximal to innominate ([place]) 12/23/2018   Mixed hyperlipidemia 10/05/2016   Familial multiple lipoprotein-type hyperlipidemia 08/02/2014   Routine general medical examination at a health care facility 08/02/2014   Recurrent major depressive episodes ([place]) 08/02/2014   Essential (primary) hypertension 08/02/2014   Overweight 08/02/2014   Diabetes mellitus, type 2 ([place]) 08/02/2014    No Known Allergies  Past Surgical History:  Procedure Laterality Date   COLONOSCOPY WITH PROPOFOL N/A 07/13/2019   Procedure: COLONOSCOPY WITH PROPOFOL;  Surgeon: [name], MD;  Location: [facility] ENDOSCOPY;  Service: Endoscopy;  Laterality: N/A;   NASAL SINUS SURGERY     REPAIR OF ACUTE ASCENDING THORACIC AORTIC DISSECTION  12/2018    Social History   Tobacco Use   Smoking status: Never   Smokeless tobacco: Never   Tobacco comments:    smoking cessation materials not required  Vaping Use   Vaping Use: Never used  Substance Use Topics   Alcohol use: Not Currently    Alcohol/week: 5.0 standard drinks of alcohol    Types: 2 Glasses of wine, 3 Cans of beer per week    Comment: weekly   Drug use: No     Medication list has been reviewed and updated.  Current Meds  Medication Sig   amLODipine (NORVASC) 10 MG tablet TAKE 1 TABLET BY MOUTH EVERY DAY   aspirin EC 81 MG tablet Take 81 mg by mouth daily. Swallow whole.   atorvastatin (LIPITOR) 40 MG tablet TAKE 1 TABLET BY MOUTH EVERY DAY   COVID-19 mRNA bivalent vaccine, Pfizer, (PFIZER COVID-19 VAC BIVALENT) injection Inject into the muscle.   glipiZIDE (GLUCOTROL XL) 2.5 MG 24 hr tablet Take 1 tablet (2.5 mg total) by mouth daily with breakfast.   lisinopril-hydrochlorothiazide (ZESTORETIC) 20-12.5 MG tablet Take 1 tablet by mouth daily.   memantine (NAMENDA) 5 MG tablet Take 1 tablet by mouth daily.   metFORMIN (GLUCOPHAGE) 1000 MG tablet Take 1 tablet (1,000 mg total) by mouth 2 (two) times daily.    OneTouch Delica Lancets 60V MISC TEST ONCE DAILY   ONETOUCH VERIO test strip TEST ONCE DAILY   traZODone (DESYREL) 50 MG tablet TAKE 1/2 TO 1 TABLET BY MOUTH AT BEDTIME AS NEEDED FOR SLEEP   venlafaxine XR (EFFEXOR-XR) 150 MG 24 hr capsule Take 1 capsule (150 mg total) by mouth daily with breakfast.   venlafaxine XR (EFFEXOR-XR) 37.5 MG 24 hr capsule Take 1 capsule (37.5 mg total) by mouth daily with breakfast.   vitamin B-12 (CYANOCOBALAMIN) 1000 MCG tablet Take 1,000 mcg by mouth daily. Taking 3 times per week       10/07/2021   10:25 AM 06/03/2021   10:12 AM 04/03/2021    8:52 AM 02/03/2021   10:30 AM  GAD 7 : Generalized Anxiety Score  Nervous, Anxious, on Edge 0 0 1 0  Control/stop worrying 0 0 2 0  Worry too much - different things 0 0 3 0  Trouble relaxing 0 0 0 0  Restless 0 0 0 0  Easily annoyed or irritable 0 0 2 0  Afraid - awful might happen 0 0 0 0  Total GAD 7 Score 0 0 8 0  Anxiety Difficulty Not difficult at all Not difficult at all Somewhat difficult  10/07/2021   10:25 AM 06/03/2021   10:11 AM 05/13/2021    9:30 AM  Depression screen PHQ 2/9  Decreased Interest 0 0 0  Down, Depressed, Hopeless 0 0 0  PHQ - 2 Score 0 0 0  Altered sleeping 0 0 0  Tired, decreased energy 0 0 0  Change in appetite 0 0 0  Feeling bad or failure about yourself  0 0 0  Trouble concentrating 0 0 0  Moving slowly or fidgety/restless 0 0 0  Suicidal thoughts 0 0 0  PHQ-9 Score 0 0 0  Difficult doing work/chores Not difficult at all Not difficult at all Not difficult at all    BP Readings from Last 3 Encounters:  10/07/21 140/72  06/03/21 130/70  04/03/21 138/90    Physical Exam Vitals and nursing note reviewed.  Constitutional:      General: He is not irritable. HENT:     Head: Normocephalic.     Right Ear: External ear normal.     Left Ear: External ear normal.     Nose: Nose normal.  Eyes:     General: No scleral icterus.       Right eye: No discharge.         Left eye: No discharge.     Conjunctiva/sclera: Conjunctivae normal.     Pupils: Pupils are equal, round, and reactive to light.  Neck:     Thyroid: No thyromegaly.     Vascular: No JVD.     Trachea: No tracheal deviation.  Cardiovascular:     Rate and Rhythm: Normal rate and regular rhythm.     Pulses: Normal pulses.     Heart sounds: Normal heart sounds, S1 normal and S2 normal. No murmur heard.    No systolic murmur is present.     No diastolic murmur is present.     No friction rub. No gallop. No S3 or S4 sounds.  Pulmonary:     Effort: No respiratory distress.     Breath sounds: Normal breath sounds. No wheezing or rales.  Abdominal:     General: Bowel sounds are normal.     Palpations: Abdomen is soft. There is no mass.     Tenderness: There is no abdominal tenderness. There is no guarding or rebound.  Musculoskeletal:        General: No tenderness. Normal range of motion.     Cervical back: Normal range of motion and neck supple.  Lymphadenopathy:     Cervical: No cervical adenopathy.  Skin:    General: Skin is warm.     Findings: No rash.  Neurological:     Mental Status: He is alert.     Wt Readings from Last 3 Encounters:  10/07/21 174 lb (78.9 kg)  06/03/21 182 lb (82.6 kg)  04/03/21 186 lb (84.4 kg)    BP 140/72   Pulse 66   Ht $R'6\' 1"'Ic$  (1.854 m)   Wt 174 lb (78.9 kg)   BMI 22.96 kg/m   Assessment and Plan:  1. Essential (primary) hypertension Chronic.  Controlled.  Stable.  Continue amlodipine 10 mg once a day.  We will recheck in 6 months. - amLODipine (NORVASC) 10 MG tablet; Take 1 tablet (10 mg total) by mouth daily.  Dispense: 90 tablet; Refill: 1  2. Familial hypercholesterolemia Chronic.  Controlled.  Stable.  Continue atorvastatin 40 mg once a day.  Will check lipid panel. - atorvastatin (LIPITOR) 40 MG tablet; Take 1 tablet (40 mg  total) by mouth daily.  Dispense: 90 tablet; Refill: 1 - Lipid Panel With LDL/HDL Ratio  3. Type 2 diabetes  mellitus without complication, without long-term current use of insulin (HCC) Chronic.  Controlled.  Stable.  Patient is not checking fasting fingersticks in the morning.  We will rely on A1c.  In the meantime likely will continue glipizide XL 2.5 mg once a day and metformin 1 g twice a day. - glipiZIDE (GLUCOTROL XL) 2.5 MG 24 hr tablet; Take 1 tablet (2.5 mg total) by mouth daily with breakfast.  Dispense: 90 tablet; Refill: 1 - metFORMIN (GLUCOPHAGE) 1000 MG tablet; Take 1 tablet (1,000 mg total) by mouth 2 (two) times daily.  Dispense: 180 tablet; Refill: 1 - HgB A1c  4. Primary insomnia Chronic.  Controlled.  Stable.  Continue trazodone 50 mg 1/2-1 nightly - traZODone (DESYREL) 50 MG tablet; Take 0.5-1 tablets (25-50 mg total) by mouth at bedtime as needed. for sleep  Dispense: 90 tablet; Refill: 1  5. Recurrent major depressive episodes (HCC) Chronic.  Controlled.  Stable.  PHQ is 0 Gad score is 0 continue on current dosing of venlafaxine Exar 150 mg at breakfast and 37.5 mg as well. - venlafaxine XR (EFFEXOR-XR) 150 MG 24 hr capsule; Take 1 capsule (150 mg total) by mouth daily with breakfast.  Dispense: 90 capsule; Refill: 1 - venlafaxine XR (EFFEXOR-XR) 37.5 MG 24 hr capsule; Take 1 capsule (37.5 mg total) by mouth daily with breakfast.  Dispense: 90 capsule; Refill: 1  6. Mood changes As noted above - venlafaxine XR (EFFEXOR-XR) 37.5 MG 24 hr capsule; Take 1 capsule (37.5 mg total) by mouth daily with breakfast.  Dispense: 90 capsule; Refill: 1

## 2021-10-08 LAB — LIPID PANEL WITH LDL/HDL RATIO
Cholesterol, Total: 129 mg/dL (ref 100–199)
HDL: 53 mg/dL (ref >39)
LDL Chol Calc (NIH): 60 mg/dL (ref 0–99)
LDL/HDL Ratio: 1.1 ratio (ref 0.0–3.6)
Triglycerides: 80 mg/dL (ref 0–149)
VLDL Cholesterol Cal: 16 mg/dL (ref 5–40)

## 2021-10-08 LAB — HEMOGLOBIN A1C
Est. average glucose Bld gHb Est-mCnc: 126 mg/dL
Hgb A1c MFr Bld: 6 % — ABNORMAL HIGH (ref 4.8–5.6)

## 2021-10-22 ENCOUNTER — Other Ambulatory Visit: Payer: Self-pay | Admitting: Family Medicine

## 2021-10-22 DIAGNOSIS — I10 Essential (primary) hypertension: Secondary | ICD-10-CM

## 2021-11-11 ENCOUNTER — Ambulatory Visit: Payer: PPO | Admitting: Family Medicine

## 2021-12-04 ENCOUNTER — Ambulatory Visit: Payer: PPO | Admitting: Family Medicine

## 2021-12-08 ENCOUNTER — Encounter: Payer: Self-pay | Admitting: Urology

## 2021-12-08 ENCOUNTER — Other Ambulatory Visit: Payer: Self-pay | Admitting: *Deleted

## 2021-12-08 ENCOUNTER — Ambulatory Visit: Payer: PPO | Admitting: Urology

## 2021-12-08 ENCOUNTER — Other Ambulatory Visit
Admission: RE | Admit: 2021-12-08 | Discharge: 2021-12-08 | Disposition: A | Discharge status: Home / Self Care | Payer: PPO | Attending: Urology | Admitting: Urology

## 2021-12-08 DIAGNOSIS — N23 Unspecified renal colic: Secondary | ICD-10-CM

## 2021-12-08 DIAGNOSIS — N200 Calculus of kidney: Secondary | ICD-10-CM | POA: Diagnosis not present

## 2021-12-08 DIAGNOSIS — N486 Induration penis plastica: Secondary | ICD-10-CM | POA: Diagnosis not present

## 2021-12-08 LAB — URINALYSIS, COMPLETE (UACMP) WITH MICROSCOPIC
Bilirubin Urine: NEGATIVE
Glucose, UA: NEGATIVE mg/dL
Hgb urine dipstick: NEGATIVE
Ketones, ur: NEGATIVE mg/dL
Nitrite: NEGATIVE
Protein, ur: 100 mg/dL — AB
Specific Gravity, Urine: 1.025 (ref 1.005–1.030)
pH: 5.5 (ref 5.0–8.0)

## 2021-12-08 NOTE — Patient Instructions (Signed)

## 2021-12-08 NOTE — Progress Notes (Signed)
   12/08/21 12:31 PM   [name] 11/19/1950 [id number]  CC: Peyronies disease, nephrolithiasis  HPI: I saw Mr. Pettigrew today for the above issues.  He is a 71 year old male who reports about a year of penile curvature with approximately 30 degree curvature downward into the left.  This is not bothersome to him and does not prevent sexual activity, denies any pain with erections.  Denies any urinary symptoms.  He also has a history of nephrolithiasis and has required shockwave lithotripsy in the past.  Recent CT at [facility] from June 2022 showed stable nonobstructing renal stones, largest measuring 5 mm bilaterally with no hydronephrosis or ureteral stones.  He denies any gross hematuria or bothersome urinary symptoms.  Denies any erectile dysfunction   PMH: Past Medical History:  Diagnosis Date   Depression    Diabetes mellitus without complication ([place])    Hyperlipidemia    Hypertension     Surgical History: Past Surgical History:  Procedure Laterality Date   COLONOSCOPY WITH PROPOFOL N/A 07/13/2019   Procedure: COLONOSCOPY WITH PROPOFOL;  Surgeon: [name], MD;  Location: [facility] ENDOSCOPY;  Service: Endoscopy;  Laterality: N/A;   NASAL SINUS SURGERY     REPAIR OF ACUTE ASCENDING THORACIC AORTIC DISSECTION  12/2018   Family History: Family History  Problem Relation Age of Onset   Heart disease Mother    Healthy Father     Social History:  reports that he has never smoked. He has never been exposed to tobacco smoke. He has never used smokeless tobacco. He reports that he does not currently use alcohol after a past usage of about 5.0 standard drinks of alcohol per week. He reports that he does not use drugs.  Physical Exam: There were no vitals taken for this visit.   Constitutional:  Alert and oriented, No acute distress. Cardiovascular: No clubbing, cyanosis, or edema. Respiratory: Normal respiratory effort, no increased work of breathing. GI: Abdomen is soft,  nontender, nondistended, no abdominal masses GU: Phallus with patent meatus, birthmark on the glans, subtle palpable plaque at the left lateral base, nontender, no superficial lesions   Pertinent Imaging: I have personally viewed and interpreted the CT from [facility] dated 09/18/2020 showing nonobstructing renal stones bilaterally, largest measuring 5 mm, no hydronephrosis, mildly thickened bladder.  Assessment & Plan:   71 year old male with mild peyronies disease that is essentially asymptomatic, as well as nonobstructing asymptomatic renal stones.  We reviewed strategies for peyronies disease including observation, Xiaflex, or surgical interventions.  He is not having any penile pain at this time and no bother from the penile curvature and this does not restrict sexual activity and he would like to pursue observation which is very reasonable.  He also would like to pursue observation for his nonobstructing renal stones, and return precautions were discussed.  We discussed general stone prevention strategies including adequate hydration with goal of producing 2.5 L of urine daily, increasing citric acid intake, increasing calcium intake during high oxalate meals, minimizing animal protein, and decreasing salt intake. Information about dietary recommendations given today.   RTC 1 year KUB for stone surveillance  Nickolas Madrid, MD 12/08/2021  [place] [address], [place] [address] [phone]

## 2021-12-21 ENCOUNTER — Ambulatory Visit (INDEPENDENT_AMBULATORY_CARE_PROVIDER_SITE_OTHER): Payer: PPO | Admitting: Family Medicine

## 2021-12-21 ENCOUNTER — Encounter: Payer: Self-pay | Admitting: Family Medicine

## 2021-12-21 ENCOUNTER — Emergency Department: Payer: PPO

## 2021-12-21 ENCOUNTER — Emergency Department
Admission: EM | Admit: 2021-12-21 | Discharge: 2021-12-21 | Disposition: A | Discharge status: Home / Self Care | Payer: PPO | Attending: Emergency Medicine | Admitting: Emergency Medicine

## 2021-12-21 ENCOUNTER — Other Ambulatory Visit: Payer: Self-pay

## 2021-12-21 VITALS — BP 130/64 | HR 60 | Ht 73.0 in | Wt 167.0 lb

## 2021-12-21 DIAGNOSIS — R1031 Right lower quadrant pain: Secondary | ICD-10-CM

## 2021-12-21 DIAGNOSIS — E119 Type 2 diabetes mellitus without complications: Secondary | ICD-10-CM | POA: Diagnosis not present

## 2021-12-21 DIAGNOSIS — I10 Essential (primary) hypertension: Secondary | ICD-10-CM | POA: Insufficient documentation

## 2021-12-21 DIAGNOSIS — K4030 Unilateral inguinal hernia, with obstruction, without gangrene, not specified as recurrent: Secondary | ICD-10-CM

## 2021-12-21 DIAGNOSIS — R634 Abnormal weight loss: Secondary | ICD-10-CM

## 2021-12-21 DIAGNOSIS — D175 Benign lipomatous neoplasm of intra-abdominal organs: Secondary | ICD-10-CM

## 2021-12-21 DIAGNOSIS — R198 Other specified symptoms and signs involving the digestive system and abdomen: Secondary | ICD-10-CM | POA: Diagnosis not present

## 2021-12-21 DIAGNOSIS — K4020 Bilateral inguinal hernia, without obstruction or gangrene, not specified as recurrent: Secondary | ICD-10-CM | POA: Diagnosis not present

## 2021-12-21 DIAGNOSIS — N200 Calculus of kidney: Secondary | ICD-10-CM | POA: Diagnosis not present

## 2021-12-21 DIAGNOSIS — K4090 Unilateral inguinal hernia, without obstruction or gangrene, not specified as recurrent: Secondary | ICD-10-CM | POA: Diagnosis not present

## 2021-12-21 DIAGNOSIS — N281 Cyst of kidney, acquired: Secondary | ICD-10-CM | POA: Diagnosis not present

## 2021-12-21 DIAGNOSIS — K5900 Constipation, unspecified: Secondary | ICD-10-CM | POA: Diagnosis not present

## 2021-12-21 LAB — URINALYSIS, ROUTINE W REFLEX MICROSCOPIC
Bacteria, UA: NONE SEEN
Bilirubin Urine: NEGATIVE
Glucose, UA: NEGATIVE mg/dL
Hgb urine dipstick: NEGATIVE
Ketones, ur: NEGATIVE mg/dL
Nitrite: NEGATIVE
Protein, ur: 100 mg/dL — AB
Specific Gravity, Urine: 1.021 (ref 1.005–1.030)
pH: 5 (ref 5.0–8.0)

## 2021-12-21 LAB — BASIC METABOLIC PANEL
Anion gap: 10 (ref 5–15)
BUN: 30 mg/dL — ABNORMAL HIGH (ref 8–23)
CO2: 27 mmol/L (ref 22–32)
Calcium: 9.9 mg/dL (ref 8.9–10.3)
Chloride: 102 mmol/L (ref 98–111)
Creatinine, Ser: 1.25 mg/dL — ABNORMAL HIGH (ref 0.61–1.24)
GFR, Estimated: >60 mL/min (ref >60)
Glucose, Bld: 113 mg/dL — ABNORMAL HIGH (ref 70–99)
Potassium: 4.8 mmol/L (ref 3.5–5.1)
Sodium: 139 mmol/L (ref 135–145)

## 2021-12-21 LAB — CBC WITH DIFFERENTIAL/PLATELET
Abs Immature Granulocytes: 0.02 10*3/uL (ref 0.00–0.07)
Basophils Absolute: 0.1 10*3/uL (ref 0.0–0.1)
Basophils Relative: 1 %
Eosinophils Absolute: 0.1 10*3/uL (ref 0.0–0.5)
Eosinophils Relative: 1 %
HCT: 47.7 % (ref 39.0–52.0)
Hemoglobin: 16.1 g/dL (ref 13.0–17.0)
Immature Granulocytes: 0 %
Lymphocytes Relative: 19 %
Lymphs Abs: 1.6 10*3/uL (ref 0.7–4.0)
MCH: 32.3 pg (ref 26.0–34.0)
MCHC: 33.8 g/dL (ref 30.0–36.0)
MCV: 95.8 fL (ref 80.0–100.0)
Monocytes Absolute: 0.6 10*3/uL (ref 0.1–1.0)
Monocytes Relative: 7 %
Neutro Abs: 5.9 10*3/uL (ref 1.7–7.7)
Neutrophils Relative %: 72 %
Platelets: 387 10*3/uL (ref 150–400)
RBC: 4.98 MIL/uL (ref 4.22–5.81)
RDW: 12.7 % (ref 11.5–15.5)
WBC: 8.3 10*3/uL (ref 4.0–10.5)
nRBC: 0 % (ref 0.0–0.2)

## 2021-12-21 LAB — HEPATIC FUNCTION PANEL
ALT: 33 U/L (ref 0–44)
AST: 32 U/L (ref 15–41)
Albumin: 4.7 g/dL (ref 3.5–5.0)
Alkaline Phosphatase: 55 U/L (ref 38–126)
Bilirubin, Direct: 0.2 mg/dL (ref 0.0–0.2)
Indirect Bilirubin: 0.6 mg/dL (ref 0.3–0.9)
Total Bilirubin: 0.8 mg/dL (ref 0.3–1.2)
Total Protein: 8.1 g/dL (ref 6.5–8.1)

## 2021-12-21 LAB — LIPASE, BLOOD: Lipase: 56 U/L — ABNORMAL HIGH (ref 11–51)

## 2021-12-21 MED ORDER — MORPHINE SULFATE (PF) 4 MG/ML IV SOLN
4.0000 mg | Freq: Once | INTRAVENOUS | Status: AC
  Administered 2021-12-21: 4 mg via INTRAVENOUS
  Filled 2021-12-21: qty 1

## 2021-12-21 MED ORDER — ONDANSETRON HCL 4 MG/2ML IJ SOLN
4.0000 mg | Freq: Once | INTRAMUSCULAR | Status: AC
  Administered 2021-12-21: 4 mg via INTRAVENOUS
  Filled 2021-12-21: qty 2

## 2021-12-21 MED ORDER — LACTATED RINGERS IV BOLUS
1000.0000 mL | Freq: Once | INTRAVENOUS | Status: AC
  Administered 2021-12-21: 1000 mL via INTRAVENOUS

## 2021-12-21 MED ORDER — IOHEXOL 300 MG/ML  SOLN
100.0000 mL | Freq: Once | INTRAMUSCULAR | Status: AC | PRN
  Administered 2021-12-21: 100 mL via INTRAVENOUS

## 2021-12-21 NOTE — ED Provider Notes (Signed)
[facility] Provider Note    Event Date/Time   First MD Initiated Contact with Patient 12/21/21 1529     (approximate)   History   Chief Complaint Groin Pain   HPI  Andrew [name] is a 71 y.o. male with past medical history of hypertension, hyperlipidemia, and diabetes who presents to the ED complaining of groin pain.  Patient reports that for the past couple of weeks he has been noticing intermittent pain in his right inguinal area that he describes as relatively mild.  He particularly notices it when he is sitting upright, which she states seems to put pressure on the area.  Pain will occasionally radiate down into his right testicle, but he denies any testicular redness or swelling.  He has not had any difficulty urinating and denies any dysuria, fever, or flank pain.  He states he has been constipated recently, but denies any nausea, vomiting, or abdominal pain above his inguinal area.  He was seen in his PCPs office initially and referred to the ED due to concern for hernia.     Physical Exam   Triage Vital Signs: ED Triage Vitals [12/21/21 1330]  Enc Vitals Group     BP (!) 149/101     Pulse Rate 80     Resp 19     Temp 97.6 F (36.4 C)     Temp Source Oral     SpO2 94 %     Weight 167 lb (75.8 kg)     Height '6\' 1"'$  (1.854 m)     Head Circumference      Peak Flow      Pain Score 0     Pain Loc      Pain Edu?      Excl. in [place]?     Most recent vital signs: Vitals:   12/21/21 1330 12/21/21 1813  BP: (!) 149/101 (!) 140/90  Pulse: 80 78  Resp: 19 18  Temp: 97.6 F (36.4 C) 98 F (36.7 C)  SpO2: 94% 95%    Constitutional: Alert and oriented. Eyes: Conjunctivae are normal. Head: Atraumatic. Nose: No congestion/rhinnorhea. Mouth/Throat: Mucous membranes are moist.  Cardiovascular: Normal rate, regular rhythm. Grossly normal heart sounds.  2+ radial pulses bilaterally. Respiratory: Normal respiratory effort.  No retractions. Lungs  CTAB. Gastrointestinal: Soft and nontender. No distention. Genitourinary: Tenderness to palpation noted in right inguinal area with possible small hernia, exam limited due to significant tenderness.  No testicular tenderness, edema, or erythema. Musculoskeletal: No lower extremity tenderness nor edema.  Neurologic:  Normal speech and language. No gross focal neurologic deficits are appreciated.    ED Results / Procedures / Treatments   Labs (all labs ordered are listed, but only abnormal results are displayed) Labs Reviewed  BASIC METABOLIC PANEL - Abnormal; Notable for the following components:      Result Value   Glucose, Bld 113 (*)    BUN 30 (*)    Creatinine, Ser 1.25 (*)    All other components within normal limits  LIPASE, BLOOD - Abnormal; Notable for the following components:   Lipase 56 (*)    All other components within normal limits  URINALYSIS, ROUTINE W REFLEX MICROSCOPIC - Abnormal; Notable for the following components:   Color, Urine YELLOW (*)    APPearance CLOUDY (*)    Protein, ur 100 (*)    Leukocytes,Ua TRACE (*)    All other components within normal limits  CBC WITH DIFFERENTIAL/PLATELET  HEPATIC FUNCTION  PANEL   RADIOLOGY CT abdomen/pelvis reviewed and interpreted by me with bilateral fat-containing inguinal hernias, no evidence of obstruction.  PROCEDURES:  Critical Care performed: No  Procedures   MEDICATIONS ORDERED IN ED: Medications  morphine (PF) 4 MG/ML injection 4 mg (4 mg Intravenous Given 12/21/21 1641)  ondansetron (ZOFRAN) injection 4 mg (4 mg Intravenous Given 12/21/21 1639)  lactated ringers bolus 1,000 mL (0 mLs Intravenous Stopped 12/21/21 1813)  iohexol (OMNIPAQUE) 300 MG/ML solution 100 mL (100 mLs Intravenous Contrast Given 12/21/21 1701)     IMPRESSION / MDM / ASSESSMENT AND PLAN / ED COURSE  I reviewed the triage vital signs and the nursing notes.                              71 y.o. male with past medical history of  hypertension, hyperlipidemia, diabetes who presents to the ED complaining of a couple weeks of mild pain in his right inguinal area, with significant tenderness noted by his PCP.  Patient's presentation is most consistent with acute presentation with potential threat to life or bodily function.  Differential diagnosis includes, but is not limited to, incarcerated hernia, strangulated hernia, benign hernia, bowel obstruction, UTI, kidney stone, epididymitis, cellulitis.  Patient nontoxic-appearing and in no acute distress, vital signs are unremarkable.  He has no tenderness in his upper abdomen but does have significant tenderness to palpation in his right inguinal area, may be small hernia present but exam is limited due to significant tenderness.  He has no testicular tenderness and no signs of infection affecting his scrotum.  Labs thus far are reassuring with no significant anemia, leukocytosis, electrolyte abnormality, or AKI.  We will further assess with CT scan, add on LFTs and lipase, urinalysis is pending.  We will treat with IV morphine and Zofran and reassess for possible hernia when he is more comfortable.  CT scan shows small bilateral fat-containing inguinal hernias, no evidence of obstruction.  Patient does have significant constipation, also severe elevation of the right hemidiaphragm with: Positioned anterior and superior to the liver.  While this could be potentially symptomatic per radiology, patient has no tenderness whatsoever in his right upper quadrant and I doubt his inguinal pain is related to this.  Some bladder wall thickening noted on CT but no evidence of UTI on urinalysis and patient denies urinary symptoms.  He is appropriate for discharge home with PCP follow-up, was counseled on bowel regimen for constipation.  He was counseled to return to the ED for new or worsening symptoms, patient agrees with plan.      FINAL CLINICAL IMPRESSION(S) / ED DIAGNOSES   Final diagnoses:   Non-recurrent bilateral inguinal hernia without obstruction or gangrene  Constipation, unspecified constipation type     Rx / DC Orders   ED Discharge Orders     None        Note:  This document was prepared using Dragon voice recognition software and may include unintentional dictation errors.   Blake Divine, MD 12/21/21 [phone]

## 2021-12-21 NOTE — Discharge Instructions (Signed)

## 2021-12-21 NOTE — ED Triage Notes (Signed)
Pt arrives with c/o right sided groin pain that Saturday. Pt was sent by PCP for evaluation for hernia. Per pt, pain radiates into right testicle.

## 2021-12-21 NOTE — Progress Notes (Signed)
Date:  12/21/2021   Name:  Andrew [name]   DOB:  1951/02/06   MRN:  [id number]   Chief Complaint: Constipation (Hard stools when goes- usually only 1 time "every 2 weeks" "it is about a foot long and flops against the toilet and won't flush")  Constipation This is a chronic problem. The current episode started more than 1 month ago. The problem is unchanged. The stool is described as firm (elongated at least a foot). Associated symptoms include abdominal pain. Pertinent negatives include no flatus or melena. He has tried stool softeners and laxatives for the symptoms. The treatment provided mild relief.  Abdominal Pain This is a new problem. The current episode started more than 1 year ago (9 months). The onset quality is gradual. The problem occurs every several days. The problem has been unchanged. The pain is located in the RLQ. The pain is moderate. Associated symptoms include constipation. Pertinent negatives include no flatus or melena. The pain is relieved by Bowel movements.    Lab Results  Component Value Date   NA 142 02/03/2021   K 4.9 02/03/2021   CO2 26 02/03/2021   GLUCOSE 110 (H) 02/03/2021   BUN 28 (H) 02/03/2021   CREATININE 1.14 02/03/2021   CALCIUM 9.8 02/03/2021   EGFR 70 02/03/2021   GFRNONAA >60 05/02/2020   Lab Results  Component Value Date   CHOL 129 10/07/2021   HDL 53 10/07/2021   LDLCALC 60 10/07/2021   TRIG 80 10/07/2021   CHOLHDL 3.3 07/31/2018   No results found for: "TSH" Lab Results  Component Value Date   HGBA1C 6.0 (H) 10/07/2021   Lab Results  Component Value Date   WBC 6.2 05/02/2020   HGB 16.1 05/02/2020   HCT 46.5 05/02/2020   MCV 95.1 05/02/2020   PLT 300 05/02/2020   No results found for: "ALT", "AST", "GGT", "ALKPHOS", "BILITOT" No results found for: "25OHVITD2", "25OHVITD3", "VD25OH"   Review of Systems  Constitutional:  Positive for appetite change and unexpected weight change.  Respiratory:  Negative for shortness of  breath and wheezing.   Cardiovascular:  Negative for chest pain, palpitations and leg swelling.  Gastrointestinal:  Positive for abdominal pain and constipation. Negative for blood in stool, flatus and melena.    Patient Active Problem List   Diagnosis Date Noted   Aortic atherosclerosis ([place]) 05/02/2020   Screening for colon cancer    Aortic dissection proximal to innominate ([place]) 12/23/2018   Mixed hyperlipidemia 10/05/2016   Familial multiple lipoprotein-type hyperlipidemia 08/02/2014   Routine general medical examination at a health care facility 08/02/2014   Recurrent major depressive episodes ([place]) 08/02/2014   Essential (primary) hypertension 08/02/2014   Overweight 08/02/2014   Diabetes mellitus, type 2 ([place]) 08/02/2014    No Known Allergies  Past Surgical History:  Procedure Laterality Date   COLONOSCOPY WITH PROPOFOL N/A 07/13/2019   Procedure: COLONOSCOPY WITH PROPOFOL;  Surgeon: [name], MD;  Location: [facility] ENDOSCOPY;  Service: Endoscopy;  Laterality: N/A;   NASAL SINUS SURGERY     REPAIR OF ACUTE ASCENDING THORACIC AORTIC DISSECTION  12/2018    Social History   Tobacco Use   Smoking status: Never    Passive exposure: Never   Smokeless tobacco: Never   Tobacco comments:    smoking cessation materials not required  Vaping Use   Vaping Use: Never used  Substance Use Topics   Alcohol use: Not Currently    Alcohol/week: 5.0 standard drinks of alcohol  Types: 2 Glasses of wine, 3 Cans of beer per week    Comment: weekly   Drug use: No     Medication list has been reviewed and updated.  Current Meds  Medication Sig   amLODipine (NORVASC) 10 MG tablet Take 1 tablet (10 mg total) by mouth daily.   aspirin EC 81 MG tablet Take 81 mg by mouth daily. Swallow whole.   atorvastatin (LIPITOR) 40 MG tablet Take 1 tablet (40 mg total) by mouth daily.   glipiZIDE (GLUCOTROL XL) 2.5 MG 24 hr tablet Take 1 tablet (2.5 mg total) by mouth daily with breakfast.    lisinopril-hydrochlorothiazide (ZESTORETIC) 20-12.5 MG tablet TAKE 1 TABLET BY MOUTH EVERY DAY   memantine (NAMENDA) 5 MG tablet Take 1 tablet by mouth daily.   metFORMIN (GLUCOPHAGE) 1000 MG tablet Take 1 tablet (1,000 mg total) by mouth 2 (two) times daily.   OneTouch Delica Lancets 16X MISC TEST ONCE DAILY   ONETOUCH VERIO test strip TEST ONCE DAILY   SODIUM FLUORIDE 5000 PPM 1.1 % GEL dental gel Take by mouth as directed.   venlafaxine XR (EFFEXOR-XR) 150 MG 24 hr capsule Take 1 capsule (150 mg total) by mouth daily with breakfast.   venlafaxine XR (EFFEXOR-XR) 37.5 MG 24 hr capsule Take 1 capsule (37.5 mg total) by mouth daily with breakfast.   vitamin B-12 (CYANOCOBALAMIN) 1000 MCG tablet Take 1,000 mcg by mouth daily. Taking 3 times per week       12/21/2021   11:41 AM 10/07/2021   10:25 AM 06/03/2021   10:12 AM 04/03/2021    8:52 AM  GAD 7 : Generalized Anxiety Score  Nervous, Anxious, on Edge 0 0 0 1  Control/stop worrying 1 0 0 2  Worry too much - different things 1 0 0 3  Trouble relaxing 1 0 0 0  Restless 1 0 0 0  Easily annoyed or irritable 0 0 0 2  Afraid - awful might happen 0 0 0 0  Total GAD 7 Score 4 0 0 8  Anxiety Difficulty Not difficult at all Not difficult at all Not difficult at all Somewhat difficult       12/21/2021   11:36 AM 10/07/2021   10:25 AM 06/03/2021   10:11 AM  Depression screen PHQ 2/9  Decreased Interest 0 0 0  Down, Depressed, Hopeless 1 0 0  PHQ - 2 Score 1 0 0  Altered sleeping 2 0 0  Tired, decreased energy 0 0 0  Change in appetite 1 0 0  Feeling bad or failure about yourself  2 0 0  Trouble concentrating 0 0 0  Moving slowly or fidgety/restless 0 0 0  Suicidal thoughts 0 0 0  PHQ-9 Score 6 0 0  Difficult doing work/chores Somewhat difficult Not difficult at all Not difficult at all    BP Readings from Last 3 Encounters:  12/21/21 130/64  10/07/21 139/70  06/03/21 130/70    Physical Exam HENT:     Head: Normocephalic.     Right  Ear: Tympanic membrane and external ear normal.     Left Ear: Tympanic membrane and external ear normal.     Nose: Nose normal.     Mouth/Throat:     Mouth: Mucous membranes are moist.  Eyes:     General: No scleral icterus.       Right eye: No discharge.        Left eye: No discharge.     Conjunctiva/sclera: Conjunctivae normal.  Pupils: Pupils are equal, round, and reactive to light.  Neck:     Thyroid: No thyromegaly.     Vascular: No JVD.     Trachea: No tracheal deviation.  Cardiovascular:     Rate and Rhythm: Normal rate and regular rhythm.     Heart sounds: Normal heart sounds, S1 normal and S2 normal. No murmur heard.    No systolic murmur is present.     No diastolic murmur is present.     No friction rub. No gallop. No S3 or S4 sounds.  Pulmonary:     Effort: No respiratory distress.     Breath sounds: Normal breath sounds. No wheezing, rhonchi or rales.  Abdominal:     General: Bowel sounds are normal.     Palpations: Abdomen is soft. There is no mass.     Tenderness: There is abdominal tenderness in the right lower quadrant. There is guarding. There is no right CVA tenderness, left CVA tenderness or rebound.     Hernia: A hernia is present. Hernia is present in the right inguinal area.  Genitourinary:    Testes:        Right: Tenderness and swelling present.     Comments: Right testicle elevated and significantly tender to any palpation. Musculoskeletal:        General: No tenderness. Normal range of motion.     Cervical back: Normal range of motion and neck supple.  Lymphadenopathy:     Cervical: No cervical adenopathy.  Skin:    General: Skin is warm.     Findings: No rash.  Neurological:     Mental Status: He is alert.     Wt Readings from Last 3 Encounters:  12/21/21 167 lb (75.8 kg)  10/07/21 174 lb (78.9 kg)  06/03/21 182 lb (82.6 kg)    BP 130/64   Pulse 60   Ht $R'6\' 1"'oM$  (1.854 m)   Wt 167 lb (75.8 kg)   BMI 22.03 kg/m   Assessment and  Plan:  1. Right lower quadrant abdominal pain Patient states that this has been going on for several months but I think this is the first problem that has evolved.  It may have to do with a prominent ileocecal valve that was noted on colonoscopy in 2021.  Patient during this time.  Roughly 9 months he states that he has had a change in bowel habits with significant constipation and having only a bowel movement every 2 weeks and when he does so its elongated and extremely firm.  Patient has not noticed any blood.  2. Prominent ileocecal valve As noted on colonoscopy there was a prominent ileocecal valve which was biopsied and noted benign colonic mucosa with small lymphoid aggregate and superficial changes.  Negative for both colitis and malignancy/dysplasia.  Not certain if this is contributing to his right lower quadrant pain and significant constipation but we will bring it to the attention of gastroenterology when we get hernia circumstance evaluated and taken care of.  3. Change in bowel function New onset.  Persistent.  Duration of several months.  Patient has evolved to only having a bowel movement every 2 weeks.  Patient has tried stool softeners and laxative with minimal results.  4. Weight loss Patient is noted to have had a 20 pound weight loss since January of this year.  5. Unilateral inguinal hernia with obstruction and without gangrene, recurrence not specified New onset.  On examination it was noted that patient has an extensively  tender inguinal area on the right side with an elevated right testes and firmness.  It is nonreducible and is so tender that the patient is unable to allow any palpation.  I am concerned for an incarcerated hernia which is nonreducible and the possibility of gangrene at early stage.   [name], MD

## 2021-12-22 ENCOUNTER — Other Ambulatory Visit: Payer: Self-pay

## 2021-12-22 DIAGNOSIS — Q433 Congenital malformations of intestinal fixation: Secondary | ICD-10-CM

## 2021-12-22 DIAGNOSIS — R1031 Right lower quadrant pain: Secondary | ICD-10-CM

## 2021-12-22 NOTE — Progress Notes (Signed)
Placed ref to GI 

## 2021-12-24 ENCOUNTER — Telehealth: Payer: Self-pay | Admitting: Family Medicine

## 2021-12-24 NOTE — Telephone Encounter (Signed)
Told pt to take as directed pt stated he could take 6.5-10. Pt verbalized understanding.  KP

## 2021-12-24 NOTE — Telephone Encounter (Signed)
Pt called to speak with Dr. Ronnald Ramp about Magnesium that he is taking / please advise

## 2021-12-28 ENCOUNTER — Ambulatory Visit: Payer: PPO | Admitting: Gastroenterology

## 2021-12-28 ENCOUNTER — Encounter: Payer: Self-pay | Admitting: Gastroenterology

## 2021-12-28 VITALS — BP 159/82 | HR 66 | Temp 98.3°F | Ht 73.0 in | Wt 168.0 lb

## 2021-12-28 DIAGNOSIS — K5900 Constipation, unspecified: Secondary | ICD-10-CM | POA: Diagnosis not present

## 2021-12-28 MED ORDER — PEG 3350-KCL-NA BICARB-NACL 420 G PO SOLR: ORAL | 0 refills | Status: DC

## 2021-12-28 NOTE — Progress Notes (Signed)
Jonathon Bellows MD, MRCP(U.K) [address]  [place]  [place], [place] [address]  Main: [phone]  Fax: [phone]   Gastroenterology Consultation  Referring Provider:     Juline Patch, MD Primary Care Physician:  Juline Patch, MD Primary Gastroenterologist: Chilaiditi syndrome         HPI:   Andrew [name] is a 71 y.o. y/o male referred for consultation & management  by Dr. Juline Patch, MD.    He is here to see me to discuss about Chilaiditi syndrome as well as constipation.  Apparently this has been noted in 2 scans in the past.  He denies any issues with breathing.  Last colonoscopy by Dr. Allen Norris back in 2021 showed no abnormalities except diverticulosis of the colon.  He does attest to a history of severe constipation can go up to 5 days without a bowel movement and when he does it is a very long piece of stool which is very wide and uncomfortable.  He has not tried any medications for the same.  I have spoken to Dr. Ronnald Ramp on the phone prior to the patient's arrival to discuss about the plan and how I can help him Past Medical History:  Diagnosis Date   Depression    Diabetes mellitus without complication ([place])    Hyperlipidemia    Hypertension     Past Surgical History:  Procedure Laterality Date   COLONOSCOPY WITH PROPOFOL N/A 07/13/2019   Procedure: COLONOSCOPY WITH PROPOFOL;  Surgeon: [name], MD;  Location: [facility] ENDOSCOPY;  Service: Endoscopy;  Laterality: N/A;   NASAL SINUS SURGERY     REPAIR OF ACUTE ASCENDING THORACIC AORTIC DISSECTION  12/2018    Prior to Admission medications   Medication Sig Start Date End Date Taking? Authorizing Provider  amLODipine (NORVASC) 10 MG tablet Take 1 tablet (10 mg total) by mouth daily. 10/07/21   Juline Patch, MD  aspirin EC 81 MG tablet Take 81 mg by mouth daily. Swallow whole.    [provider]  atorvastatin (LIPITOR) 40 MG tablet Take 1 tablet (40 mg total) by mouth daily. 10/07/21   Juline Patch, MD  glipiZIDE (GLUCOTROL XL) 2.5 MG 24 hr tablet Take 1 tablet (2.5 mg total) by mouth daily with breakfast. 10/07/21   Juline Patch, MD  lisinopril-hydrochlorothiazide (ZESTORETIC) 20-12.5 MG tablet TAKE 1 TABLET BY MOUTH EVERY DAY 10/22/21   Juline Patch, MD  memantine (NAMENDA) 5 MG tablet Take 1 tablet by mouth daily. 07/30/20   [provider]  metFORMIN (GLUCOPHAGE) 1000 MG tablet Take 1 tablet (1,000 mg total) by mouth 2 (two) times daily. 10/07/21   Juline Patch, MD  OneTouch Delica Lancets 87O MISC TEST ONCE DAILY 09/16/20   Juline Patch, MD  [facility] VERIO test strip TEST ONCE DAILY 08/06/20   Juline Patch, MD  SODIUM FLUORIDE 5000 PPM 1.1 % GEL dental gel Take by mouth as directed. 10/02/21   [provider]  traZODone (DESYREL) 50 MG tablet Take 0.5-1 tablets (25-50 mg total) by mouth at bedtime as needed. for sleep Patient not taking: Reported on 12/21/2021 10/07/21   Juline Patch, MD  venlafaxine XR (EFFEXOR-XR) 150 MG 24 hr capsule Take 1 capsule (150 mg total) by mouth daily with breakfast. 10/07/21   Juline Patch, MD  venlafaxine XR (EFFEXOR-XR) 37.5 MG 24 hr capsule Take 1 capsule (37.5 mg total) by mouth daily with breakfast. 10/07/21  Juline Patch, MD  vitamin B-12 (CYANOCOBALAMIN) 1000 MCG tablet Take 1,000 mcg by mouth daily. Taking 3 times per week    [provider]    Family History  Problem Relation Age of Onset   Heart disease Mother    Healthy Father      Social History   Tobacco Use   Smoking status: Never    Passive exposure: Never   Smokeless tobacco: Never   Tobacco comments:    smoking cessation materials not required  Vaping Use   Vaping Use: Never used  Substance Use Topics   Alcohol use: Not Currently    Alcohol/week: 5.0 standard drinks of alcohol    Types: 2 Glasses of wine, 3 Cans of beer per week    Comment: weekly   Drug use: No    Allergies as of 12/28/2021   (No Known Allergies)     Review of Systems:    All systems reviewed and negative except where noted in HPI.   Physical Exam:  BP (!) 159/82   Pulse 66   Temp 98.3 F (36.8 C) (Oral)   Ht '6\' 1"'$  (1.854 m)   Wt 168 lb (76.2 kg)   BMI 22.16 kg/m  No LMP for male patient. Psych:  Alert and cooperative. Normal mood and affect. General:   Alert,  Well-developed, well-nourished, pleasant and cooperative in NAD Head:  Normocephalic and atraumatic. Eyes:  Sclera clear, no icterus.   Conjunctiva pink. Neurologic:  Alert and oriented x3;  grossly normal neurologically. Psych:  Alert and cooperative. Normal mood and affect.  Imaging Studies: CT Abdomen Pelvis W Contrast  Result Date: 12/21/2021 CLINICAL DATA:  Right-sided groin pain EXAM: CT ABDOMEN AND PELVIS WITH CONTRAST TECHNIQUE: Multidetector CT imaging of the abdomen and pelvis was performed using the standard protocol following bolus administration of intravenous contrast. RADIATION DOSE REDUCTION: This exam was performed according to the departmental dose-optimization program which includes automated exposure control, adjustment of the mA and/or kV according to patient size and/or use of iterative reconstruction technique. CONTRAST:  157m OMNIPAQUE IOHEXOL 300 MG/ML  SOLN COMPARISON:  12/22/2018 FINDINGS: Lower chest: Severe elevation of the right hemidiaphragm. Coronary artery calcifications. Hepatobiliary: No solid liver abnormality is seen. No gallstones, gallbladder wall thickening, or biliary dilatation. Pancreas: Unremarkable. No pancreatic ductal dilatation or surrounding inflammatory changes. Spleen: Normal in size without significant abnormality. Adrenals/Urinary Tract: Adrenal glands are unremarkable. Simple, benign bilateral renal cortical cysts, for which no further follow-up or characterization is required. Nonobstructive calculus of the inferior pole of the left kidney (series 5, image 50). Mild thickening of the urinary bladder, likely secondary to  chronic outlet obstruction. Stomach/Bowel: Stomach is within normal limits. Appendix appears normal. The hepatic flexure is anterior and superior to the liver (series 2, image 14). Large burden of stool throughout the colon and rectum. Pancolonic diverticulosis, most notably in the sigmoid. No evidence of bowel wall thickening, distention, or inflammatory changes. Vascular/Lymphatic: Aortic atherosclerosis. No enlarged abdominal or pelvic lymph nodes. Reproductive: Prostatomegaly. Other: Small, fat containing bilateral inguinal hernias. No ascites. Musculoskeletal: No acute or significant osseous findings. IMPRESSION: 1. Small, fat containing bilateral inguinal hernias. 2. Large burden of stool throughout the colon and rectum. 3. Pancolonic diverticulosis, most notably in the sigmoid. No evidence of acute diverticulitis. 4. Nonobstructive left nephrolithiasis. 5. Prostatomegaly with mild thickening of the urinary bladder, likely secondary to chronic outlet obstruction. 6. Severe elevation of the right hemidiaphragm with colon positioned anterior and superior to the liver, this  finding potentially symptomatic (i.e. Chilaiditi syndrome). 7. Coronary artery disease. Aortic Atherosclerosis (ICD10-I70.0). Electronically Signed   By: Delanna Ahmadi M.D.   On: 12/21/2021 17:24    Assessment and Plan:   Andrew [name] is a 71 y.o. y/o male has been referred for Chilaiditi syndrome.  Also has constipation.  The patient is asymptomatic from this condition.  Unclear if constipation is an effect of the syndrome or syndrome is being exhibited due to underlying constipation.  Either way I believe we should treat his constipation aggressively and see how he feels.  He has been referred to see a surgeon and at that point of time consideration to repeat the scan when his constipation has been treated adequately can be considered. Plan 1.  We will send him a prescription for GoLytely 1 gallon to clean out his colon and then  start on Linzess 290 mcg daily, I will give him samples for 2 weeks.  I will give him a call in 10 days to find out if the medication is working or not working.  Based on his response will further modify  Follow up in 10 days video visit  Dr Jonathon Bellows MD,MRCP(U.K)

## 2021-12-31 DIAGNOSIS — I69811 Memory deficit following other cerebrovascular disease: Secondary | ICD-10-CM | POA: Diagnosis not present

## 2021-12-31 DIAGNOSIS — G301 Alzheimer's disease with late onset: Secondary | ICD-10-CM | POA: Diagnosis not present

## 2021-12-31 DIAGNOSIS — F060 Psychotic disorder with hallucinations due to known physiological condition: Secondary | ICD-10-CM | POA: Diagnosis not present

## 2022-01-01 ENCOUNTER — Encounter: Payer: Self-pay | Admitting: Family Medicine

## 2022-01-07 ENCOUNTER — Telehealth: Payer: Self-pay | Admitting: Family Medicine

## 2022-01-07 ENCOUNTER — Telehealth: Payer: Self-pay

## 2022-01-07 NOTE — Telephone Encounter (Signed)
Spoke to pt's wife today about upcoming appt with general surg at [facility]. Dr. Vicente Males is treating for constipation but pt needs to see someone about a possible hernia. He will keep the appt and follow up with Vicente Males about constipation in a week. Also, told wife that if insurance will not cover [facility], for her to let me know who is covered and we will send the referral over.

## 2022-01-07 NOTE — Telephone Encounter (Signed)
Copied from [place] [phone]. Topic: General - Inquiry >> Jan 07, 2022 10:29 AM Devoria Glassing wrote: Reason for CRM: Wife would like Baxter Flattery to call her at any time today.  She says it is not urgent, and concerning appt w/ a GI dr

## 2022-01-12 ENCOUNTER — Other Ambulatory Visit: Payer: Self-pay

## 2022-01-12 ENCOUNTER — Telehealth (INDEPENDENT_AMBULATORY_CARE_PROVIDER_SITE_OTHER): Payer: PPO | Admitting: Gastroenterology

## 2022-01-12 DIAGNOSIS — K5900 Constipation, unspecified: Secondary | ICD-10-CM

## 2022-01-12 NOTE — Progress Notes (Signed)
Andrew [name] , MD [address]  [place], [place] [address]  Main: [phone]  Fax: [phone]   Primary Care Physician: Juline Patch, MD  Virtual Visit via Video Note  I connected with patient on 01/12/22 at  1:45 PM EDT by video and verified that I am speaking with the correct person using two identifiers.   I discussed the limitations, risks, security and privacy concerns of performing an evaluation and management service by video  and the availability of in person appointments. I also discussed with the patient that there may be a patient responsible charge related to this service. The patient expressed understanding and agreed to proceed.  Location of Patient: Home Location of Provider: Home Persons involved: Patient and provider only   History of Present Illness: Chief Complaint  Patient presents with   Constipation    HPI: Andrew [name] is a 71 y.o. 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 Apparently this has been noted in 2 scans in the past.  He denies any issues with breathing.  Last colonoscopy by Dr. Allen Norris back in 2021 showed no abnormalities except diverticulosis of the colon.  He does attest to a history of severe constipation can go up to 5 days without a bowel movement and when he does it is a very long piece of stool which is very wide and uncomfortable.  He has not tried any medications for the same.  Interval history   12/28/2021-01/12/2022  At his last visit he was given a gallon of GoLytely to clean out his colon and start on Linzess 290 mcg daily samples.  He was given 2 weeks of samples.  After we gave him the samples he says that he has had very good bowel movements once every 2 to 3 days watery in consistency which she does not seem to be having a problem with.  He says he takes the medicines 30 minutes before his breakfast.  No complaints  presently.    Current Outpatient Medications  Medication Sig Dispense Refill   amLODipine (NORVASC) 10 MG tablet Take 1 tablet (10 mg total) by mouth daily. 90 tablet 1   aspirin EC 81 MG tablet Take 81 mg by mouth daily. Swallow whole.     atorvastatin (LIPITOR) 40 MG tablet Take 1 tablet (40 mg total) by mouth daily. 90 tablet 1   glipiZIDE (GLUCOTROL XL) 2.5 MG 24 hr tablet Take 1 tablet (2.5 mg total) by mouth daily with breakfast. 90 tablet 1   lisinopril-hydrochlorothiazide (ZESTORETIC) 20-12.5 MG tablet TAKE 1 TABLET BY MOUTH EVERY DAY 90 tablet 0   memantine (NAMENDA) 5 MG tablet Take 1 tablet by mouth daily.     metFORMIN (GLUCOPHAGE) 1000 MG tablet Take 1 tablet (1,000 mg total) by mouth 2 (two) times daily. 180 tablet 1   OneTouch Delica Lancets 12W MISC TEST ONCE DAILY 100 each 0   ONETOUCH VERIO test strip TEST ONCE DAILY 100 strip 1   polyethylene glycol-electrolytes (NULYTELY) 420 g solution Please drink 8 oz glass of mixture every 15 minutes until it is all gone. 4000 mL 0   SODIUM FLUORIDE 5000 PPM 1.1 % GEL dental gel Take by mouth as directed.     traZODone (DESYREL) 50 MG tablet Take 0.5-1 tablets (25-50 mg total) by mouth at bedtime as needed. for sleep 90 tablet 1   venlafaxine XR (EFFEXOR-XR) 150 MG  24 hr capsule Take 1 capsule (150 mg total) by mouth daily with breakfast. 90 capsule 1   venlafaxine XR (EFFEXOR-XR) 37.5 MG 24 hr capsule Take 1 capsule (37.5 mg total) by mouth daily with breakfast. 90 capsule 1   No current facility-administered medications for this visit.    Allergies as of 01/12/2022   (No Known Allergies)    Review of Systems:    All systems reviewed and negative except where noted in HPI.  General Appearance:    Alert, cooperative, no distress, appears stated age  Head:    Normocephalic, without obvious abnormality, atraumatic  Eyes:    PERRL, conjunctiva/corneas clear,  Ears:    Grossly normal hearing    Neurologic:  Grossly normal     Observations/Objective:  Labs: CMP     Component Value Date/Time   NA 139 12/21/2021 1332   NA 142 02/03/2021 1112   K 4.8 12/21/2021 1332   CL 102 12/21/2021 1332   CO2 27 12/21/2021 1332   GLUCOSE 113 (H) 12/21/2021 1332   BUN 30 (H) 12/21/2021 1332   BUN 28 (H) 02/03/2021 1112   CREATININE 1.25 (H) 12/21/2021 1332   CALCIUM 9.9 12/21/2021 1332   PROT 8.1 12/21/2021 1332   ALBUMIN 4.7 12/21/2021 1332   ALBUMIN 4.7 02/03/2021 1112   AST 32 12/21/2021 1332   ALT 33 12/21/2021 1332   ALKPHOS 55 12/21/2021 1332   BILITOT 0.8 12/21/2021 1332   GFRNONAA >60 12/21/2021 1332   GFRAA 101 06/28/2019 1528   Lab Results  Component Value Date   WBC 8.3 12/21/2021   HGB 16.1 12/21/2021   HCT 47.7 12/21/2021   MCV 95.8 12/21/2021   PLT 387 12/21/2021    Imaging Studies: CT Abdomen Pelvis W Contrast  Result Date: 12/21/2021 CLINICAL DATA:  Right-sided groin pain EXAM: CT ABDOMEN AND PELVIS WITH CONTRAST TECHNIQUE: Multidetector CT imaging of the abdomen and pelvis was performed using the standard protocol following bolus administration of intravenous contrast. RADIATION DOSE REDUCTION: This exam was performed according to the departmental dose-optimization program which includes automated exposure control, adjustment of the mA and/or kV according to patient size and/or use of iterative reconstruction technique. CONTRAST:  135m OMNIPAQUE IOHEXOL 300 MG/ML  SOLN COMPARISON:  12/22/2018 FINDINGS: Lower chest: Severe elevation of the right hemidiaphragm. Coronary artery calcifications. Hepatobiliary: No solid liver abnormality is seen. No gallstones, gallbladder wall thickening, or biliary dilatation. Pancreas: Unremarkable. No pancreatic ductal dilatation or surrounding inflammatory changes. Spleen: Normal in size without significant abnormality. Adrenals/Urinary Tract: Adrenal glands are unremarkable. Simple, benign bilateral renal cortical cysts, for which no further follow-up or  characterization is required. Nonobstructive calculus of the inferior pole of the left kidney (series 5, image 50). Mild thickening of the urinary bladder, likely secondary to chronic outlet obstruction. Stomach/Bowel: Stomach is within normal limits. Appendix appears normal. The hepatic flexure is anterior and superior to the liver (series 2, image 14). Large burden of stool throughout the colon and rectum. Pancolonic diverticulosis, most notably in the sigmoid. No evidence of bowel wall thickening, distention, or inflammatory changes. Vascular/Lymphatic: Aortic atherosclerosis. No enlarged abdominal or pelvic lymph nodes. Reproductive: Prostatomegaly. Other: Small, fat containing bilateral inguinal hernias. No ascites. Musculoskeletal: No acute or significant osseous findings. IMPRESSION: 1. Small, fat containing bilateral inguinal hernias. 2. Large burden of stool throughout the colon and rectum. 3. Pancolonic diverticulosis, most notably in the sigmoid. No evidence of acute diverticulitis. 4. Nonobstructive left nephrolithiasis. 5. Prostatomegaly with mild thickening of the urinary bladder,  likely secondary to chronic outlet obstruction. 6. Severe elevation of the right hemidiaphragm with colon positioned anterior and superior to the liver, this finding potentially symptomatic (i.e. Chilaiditi syndrome). 7. Coronary artery disease. Aortic Atherosclerosis (ICD10-I70.0). Electronically Signed   By: Delanna Ahmadi M.D.   On: 12/21/2021 17:24    Assessment and Plan:   Andrew [name] is a 71 y.o. y/o male here to follow-up for for Chilaiditi syndrome and constipation.  The patient is asymptomatic from this condition.  Unclear if constipation is an effect of the syndrome or syndrome is being exhibited due to underlying constipation.  Either way I believe we should treat his constipation aggressively and see how he feels.  He has been referred to see a surgeon at [facility] and he has an appointment coming up and at  that point of time consideration to repeat the scan when his constipation has been treated adequately can be considered.  Presently his constipation has responded very well to Linzess 290 mcg stool is in fact watery.  I will give him a sample of half dose of Linzess at 145 mcg/day.  He has been advised to give it a trial and then decide between the 2 dosages which works best for him following which he will call my office and let me know which one to prescribe him for 90 days with 3 refills.  He will contact me after his visit to do if he requires any other input from my side.    I discussed the assessment and treatment plan with the patient. The patient was provided an opportunity to ask questions and all were answered. The patient agreed with the plan and demonstrated an understanding of the instructions.   The patient was advised to call back or seek an in-person evaluation if the symptoms worsen or if the condition fails to improve as anticipated.  I provided 20 minutes of face-to-face time during this encounter.  Dr Andrew Bellows MD,MRCP [facility]) Gastroenterology/Hepatology Pager: [phone]   Speech recognition software was used to dictate this note.

## 2022-01-14 DIAGNOSIS — G3184 Mild cognitive impairment, so stated: Secondary | ICD-10-CM | POA: Diagnosis not present

## 2022-01-28 ENCOUNTER — Other Ambulatory Visit: Payer: Self-pay

## 2022-01-28 MED ORDER — LINACLOTIDE 145 MCG PO CAPS: 145.0000 ug | ORAL_CAPSULE | Freq: Every day | ORAL | 3 refills | Status: DC

## 2022-02-08 ENCOUNTER — Ambulatory Visit (INDEPENDENT_AMBULATORY_CARE_PROVIDER_SITE_OTHER): Payer: PPO | Admitting: Family Medicine

## 2022-02-08 ENCOUNTER — Encounter: Payer: Self-pay | Admitting: Family Medicine

## 2022-02-08 VITALS — BP 128/78 | HR 72 | Ht 73.0 in | Wt 170.0 lb

## 2022-02-08 DIAGNOSIS — E119 Type 2 diabetes mellitus without complications: Secondary | ICD-10-CM

## 2022-02-08 MED ORDER — GLIPIZIDE ER 2.5 MG PO TB24: 2.5000 mg | ORAL_TABLET | Freq: Every day | ORAL | 1 refills | Status: DC

## 2022-02-08 MED ORDER — METFORMIN HCL 1000 MG PO TABS: 1000.0000 mg | ORAL_TABLET | Freq: Two times a day (BID) | ORAL | 1 refills | Status: DC

## 2022-02-08 NOTE — Progress Notes (Signed)
Date:  02/08/2022   Name:  Andrew [name]   DOB:  [birth year]   MRN:  [id number]   Chief Complaint: Diabetes  Diabetes He presents for his follow-up diabetic visit. He has type 2 diabetes mellitus. His disease course has been stable. There are no hypoglycemic associated symptoms. Pertinent negatives for hypoglycemia include no dizziness, headaches or nervousness/anxiousness. There are no diabetic associated symptoms. Pertinent negatives for diabetes include no chest pain, no polydipsia and no polyuria. There are no hypoglycemic complications. Symptoms are stable. Pertinent negatives for diabetic complications include no autonomic neuropathy, CVA, heart disease, impotence, nephropathy, peripheral neuropathy, PVD or retinopathy. Risk factors for coronary artery disease include dyslipidemia and hypertension. Current diabetic treatment includes oral agent (dual therapy). His weight is stable. He is following a generally healthy diet. Meal planning includes avoidance of concentrated sweets and carbohydrate counting. He participates in exercise intermittently. An ACE inhibitor/angiotensin II receptor blocker is being taken.    Lab Results  Component Value Date   NA 139 12/21/2021   K 4.8 12/21/2021   CO2 27 12/21/2021   GLUCOSE 113 (H) 12/21/2021   BUN 30 (H) 12/21/2021   CREATININE 1.25 (H) 12/21/2021   CALCIUM 9.9 12/21/2021   EGFR 70 02/03/2021   GFRNONAA >60 12/21/2021   Lab Results  Component Value Date   CHOL 129 10/07/2021   HDL 53 10/07/2021   LDLCALC 60 10/07/2021   TRIG 80 10/07/2021   CHOLHDL 3.3 07/31/2018   No results found for: "TSH" Lab Results  Component Value Date   HGBA1C 6.0 (H) 10/07/2021   Lab Results  Component Value Date   WBC 8.3 12/21/2021   HGB 16.1 12/21/2021   HCT 47.7 12/21/2021   MCV 95.8 12/21/2021   PLT 387 12/21/2021   Lab Results  Component Value Date   ALT 33 12/21/2021   AST 32 12/21/2021   ALKPHOS 55 12/21/2021   BILITOT 0.8  12/21/2021   No results found for: "25OHVITD2", "25OHVITD3", "VD25OH"   Review of Systems  Constitutional:  Negative for chills and fever.  HENT:  Negative for drooling, ear discharge, ear pain and sore throat.   Respiratory:  Negative for cough, shortness of breath and wheezing.   Cardiovascular:  Negative for chest pain, palpitations and leg swelling.  Gastrointestinal:  Negative for abdominal pain, blood in stool, constipation, diarrhea and nausea.  Endocrine: Negative for polydipsia and polyuria.  Genitourinary:  Negative for dysuria, frequency, hematuria, impotence and urgency.  Musculoskeletal:  Negative for back pain, myalgias and neck pain.  Skin:  Negative for rash.  Allergic/Immunologic: Negative for environmental allergies.  Neurological:  Negative for dizziness and headaches.  Hematological:  Does not bruise/bleed easily.  Psychiatric/Behavioral:  Negative for suicidal ideas. The patient is not nervous/anxious.     Patient Active Problem List   Diagnosis Date Noted   Personal history of other malignant neoplasm of skin 10/06/2021   Aortic atherosclerosis ([place]) 05/02/2020   Screening for colon cancer    Aortic dissection proximal to innominate ([place]) 12/23/2018   Mixed hyperlipidemia 10/05/2016   Familial multiple lipoprotein-type hyperlipidemia 08/02/2014   Routine general medical examination at a health care facility 08/02/2014   Recurrent major depressive episodes ([place]) 08/02/2014   Essential (primary) hypertension 08/02/2014   Diabetes mellitus, type 2 ([place]) 08/02/2014    No Known Allergies  Past Surgical History:  Procedure Laterality Date   COLONOSCOPY WITH PROPOFOL N/A 07/13/2019   Procedure: COLONOSCOPY WITH PROPOFOL;  Surgeon: [name], MD;  Location: [facility] ENDOSCOPY;  Service: Endoscopy;  Laterality: N/A;   NASAL SINUS SURGERY     REPAIR OF ACUTE ASCENDING THORACIC AORTIC DISSECTION  12/2018    Social History   Tobacco Use   Smoking status: Never     Passive exposure: Never   Smokeless tobacco: Never   Tobacco comments:    smoking cessation materials not required  Vaping Use   Vaping Use: Never used  Substance Use Topics   Alcohol use: Not Currently    Alcohol/week: 5.0 standard drinks of alcohol    Types: 2 Glasses of wine, 3 Cans of beer per week    Comment: weekly   Drug use: No     Medication list has been reviewed and updated.  Current Meds  Medication Sig   amLODipine (NORVASC) 10 MG tablet Take 1 tablet (10 mg total) by mouth daily.   aspirin EC 81 MG tablet Take 81 mg by mouth daily. Swallow whole.   atorvastatin (LIPITOR) 40 MG tablet Take 1 tablet (40 mg total) by mouth daily.   glipiZIDE (GLUCOTROL XL) 2.5 MG 24 hr tablet Take 1 tablet (2.5 mg total) by mouth daily with breakfast.   linaclotide (LINZESS) 145 MCG CAPS capsule Take 1 capsule (145 mcg total) by mouth daily. (Patient taking differently: Take 145 mcg by mouth daily. GI DR Vicente Males)   lisinopril-hydrochlorothiazide (ZESTORETIC) 20-12.5 MG tablet TAKE 1 TABLET BY MOUTH EVERY DAY   memantine (NAMENDA) 5 MG tablet Take 1 tablet by mouth daily.   metFORMIN (GLUCOPHAGE) 1000 MG tablet Take 1 tablet (1,000 mg total) by mouth 2 (two) times daily.   OneTouch Delica Lancets 16X MISC TEST ONCE DAILY   ONETOUCH VERIO test strip TEST ONCE DAILY   QUEtiapine (SEROQUEL) 50 MG tablet Take 50-100 mg by mouth at bedtime. Dr. Wylene Simmer   SODIUM FLUORIDE 5000 PPM 1.1 % GEL dental gel Take by mouth as directed.   venlafaxine XR (EFFEXOR-XR) 150 MG 24 hr capsule Take 1 capsule (150 mg total) by mouth daily with breakfast.   venlafaxine XR (EFFEXOR-XR) 37.5 MG 24 hr capsule Take 1 capsule (37.5 mg total) by mouth daily with breakfast.   [DISCONTINUED] traZODone (DESYREL) 50 MG tablet Take 0.5-1 tablets (25-50 mg total) by mouth at bedtime as needed. for sleep       02/08/2022   10:52 AM 12/21/2021   11:41 AM 10/07/2021   10:25 AM 06/03/2021   10:12 AM  GAD 7 : Generalized  Anxiety Score  Nervous, Anxious, on Edge 0 0 0 0  Control/stop worrying 0 1 0 0  Worry too much - different things 0 1 0 0  Trouble relaxing 0 1 0 0  Restless 0 1 0 0  Easily annoyed or irritable 0 0 0 0  Afraid - awful might happen 0 0 0 0  Total GAD 7 Score 0 4 0 0  Anxiety Difficulty Not difficult at all Not difficult at all Not difficult at all Not difficult at all       02/08/2022   10:51 AM 12/21/2021   11:36 AM 10/07/2021   10:25 AM  Depression screen PHQ 2/9  Decreased Interest 0 0 0  Down, Depressed, Hopeless 0 1 0  PHQ - 2 Score 0 1 0  Altered sleeping 0 2 0  Tired, decreased energy 0 0 0  Change in appetite 0 1 0  Feeling bad or failure about yourself  0 2 0  Trouble concentrating 0 0 0  Moving slowly  or fidgety/restless 0 0 0  Suicidal thoughts 0 0 0  PHQ-9 Score 0 6 0  Difficult doing work/chores Not difficult at all Somewhat difficult Not difficult at all    BP Readings from Last 3 Encounters:  02/08/22 128/78  12/28/21 (!) 159/82  12/21/21 (!) 140/90    Physical Exam Vitals and nursing note reviewed.  HENT:     Head: Normocephalic.     Right Ear: Tympanic membrane and external ear normal.     Left Ear: Tympanic membrane and external ear normal.     Nose: Nose normal.     Mouth/Throat:     Mouth: Mucous membranes are moist.  Eyes:     General: No scleral icterus.       Right eye: No discharge.        Left eye: No discharge.     Conjunctiva/sclera: Conjunctivae normal.     Pupils: Pupils are equal, round, and reactive to light.  Neck:     Thyroid: No thyromegaly.     Vascular: No JVD.     Trachea: No tracheal deviation.  Cardiovascular:     Rate and Rhythm: Normal rate and regular rhythm.     Heart sounds: Normal heart sounds. No murmur heard.    No friction rub. No gallop.  Pulmonary:     Effort: No respiratory distress.     Breath sounds: Normal breath sounds. No wheezing, rhonchi or rales.  Abdominal:     General: Bowel sounds are normal.      Palpations: Abdomen is soft. There is no mass.     Tenderness: There is no abdominal tenderness. There is no guarding or rebound.  Musculoskeletal:        General: No tenderness. Normal range of motion.     Cervical back: Normal range of motion and neck supple.  Lymphadenopathy:     Cervical: No cervical adenopathy.  Skin:    General: Skin is warm.     Findings: No rash.  Neurological:     Mental Status: He is alert.     Coordination: Coordination normal.     Wt Readings from Last 3 Encounters:  02/08/22 170 lb (77.1 kg)  12/28/21 168 lb (76.2 kg)  12/21/21 167 lb (75.8 kg)    BP 128/78   Pulse 72   Ht _0  (1.854 m)   Wt 170 lb (77.1 kg)   SpO2 96%   BMI 22.43 kg/m   Assessment and Plan:  1. Type 2 diabetes mellitus without complication, without long-term current use of insulin (HCC) Controlled.  Stable.  Asymptomatic.  Tolerating medications well with last A1c at 6.1.  Continue glipizide XL 2.5 once a day and metformin 1 g twice a day.  We will check A1c and microalbuminuria for current status of control. - glipiZIDE (GLUCOTROL XL) 2.5 MG 24 hr tablet; Take 1 tablet (2.5 mg total) by mouth daily with breakfast.  Dispense: 90 tablet; Refill: 1 - metFORMIN (GLUCOPHAGE) 1000 MG tablet; Take 1 tablet (1,000 mg total) by mouth 2 (two) times daily.  Dispense: 180 tablet; Refill: 1 - HgB A1c - Microalbumin / creatinine urine ratio    [name], MD

## 2022-02-09 ENCOUNTER — Encounter: Payer: Self-pay | Admitting: Family Medicine

## 2022-02-09 LAB — HEMOGLOBIN A1C
Est. average glucose Bld gHb Est-mCnc: 126 mg/dL
Hgb A1c MFr Bld: 6 % — ABNORMAL HIGH (ref 4.8–5.6)

## 2022-02-11 DIAGNOSIS — F060 Psychotic disorder with hallucinations due to known physiological condition: Secondary | ICD-10-CM | POA: Diagnosis not present

## 2022-02-11 DIAGNOSIS — G301 Alzheimer's disease with late onset: Secondary | ICD-10-CM | POA: Diagnosis not present

## 2022-02-11 DIAGNOSIS — I69811 Memory deficit following other cerebrovascular disease: Secondary | ICD-10-CM | POA: Diagnosis not present

## 2022-02-11 DIAGNOSIS — F09 Unspecified mental disorder due to known physiological condition: Secondary | ICD-10-CM | POA: Diagnosis not present

## 2022-02-22 DIAGNOSIS — Z7982 Long term (current) use of aspirin: Secondary | ICD-10-CM | POA: Diagnosis not present

## 2022-02-22 DIAGNOSIS — Z7984 Long term (current) use of oral hypoglycemic drugs: Secondary | ICD-10-CM | POA: Diagnosis not present

## 2022-02-22 DIAGNOSIS — K5730 Diverticulosis of large intestine without perforation or abscess without bleeding: Secondary | ICD-10-CM | POA: Diagnosis not present

## 2022-02-22 DIAGNOSIS — K4020 Bilateral inguinal hernia, without obstruction or gangrene, not specified as recurrent: Secondary | ICD-10-CM | POA: Diagnosis not present

## 2022-02-23 DIAGNOSIS — C44222 Squamous cell carcinoma of skin of right ear and external auricular canal: Secondary | ICD-10-CM | POA: Diagnosis not present

## 2022-03-06 IMAGING — MR MR CERVICAL SPINE W/O CM
5 series · 40 of 48 positions shown · non-contrast
Comparison: None.

CLINICAL DATA: Prior fall.  Headaches and dizziness.

EXAM:
MRI CERVICAL SPINE WITHOUT CONTRAST
TECHNIQUE: Multiplanar, multisequence MR imaging of the cervical spine was
performed. No intravenous contrast was administered.

[Series 2: T2 · sagittal · 3.0mm · 0.69mm/px · 6 of 13 slices shown (1 of 2)]
[im 1/13]
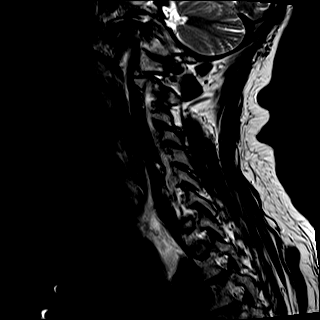
[im 3/13]
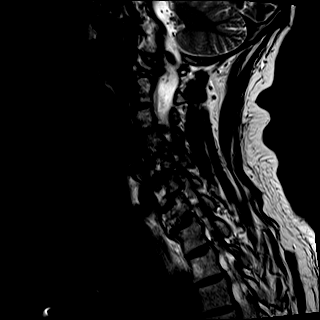
[im 5/13]
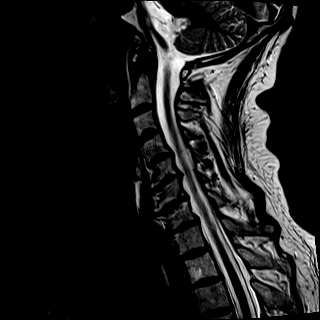
[im 8/13]
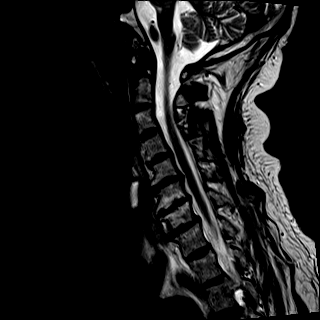
[im 10/13]
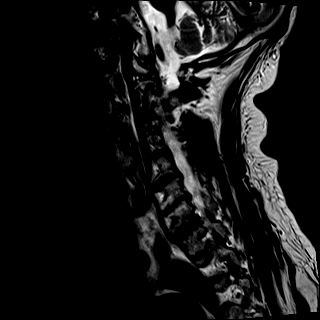
[im 13/13]
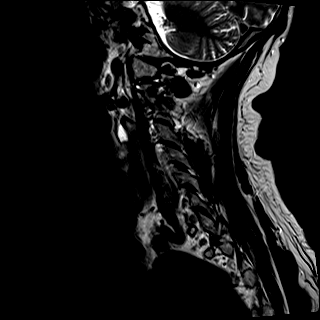

[Series 3: T1 · sagittal · 3.0mm · 0.86mm/px · 7 of 13 slices shown]
[im 1/13]
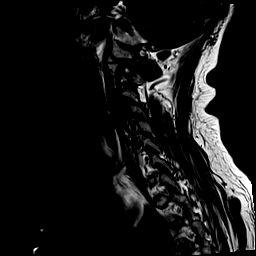
[im 3/13]
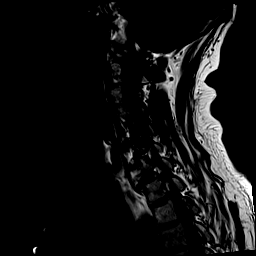
[im 5/13]
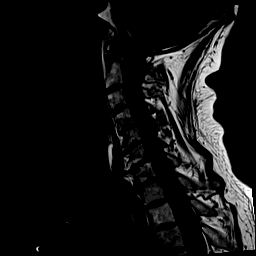
[im 7/13]
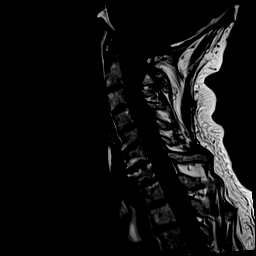
[im 9/13]
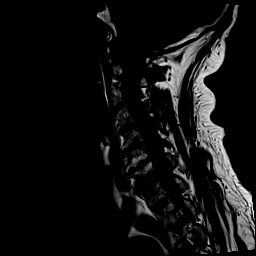
[im 11/13]
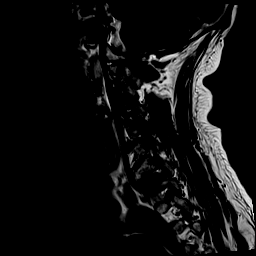
[im 13/13]
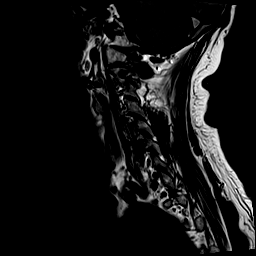

[Series 4: STIR · sagittal · 3.0mm · 0.69mm/px · 7 of 13 slices shown]
[im 1/13]
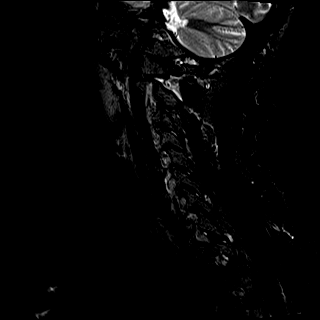
[im 3/13]
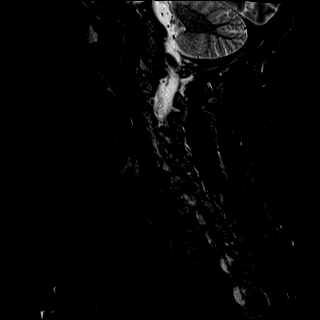
[im 5/13]
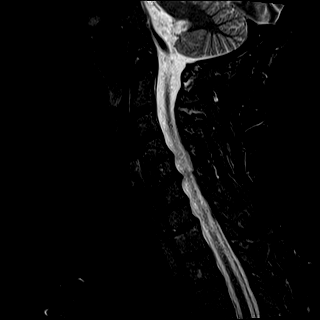
[im 7/13]
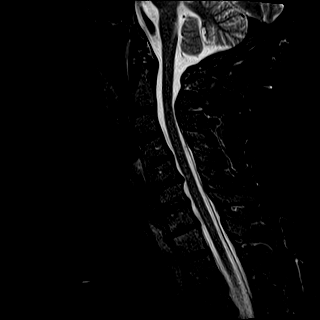
[im 9/13]
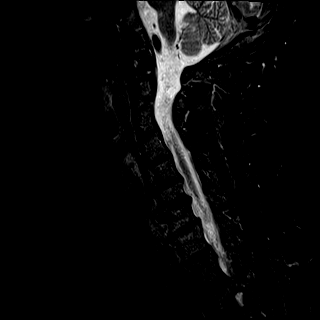
[im 11/13]
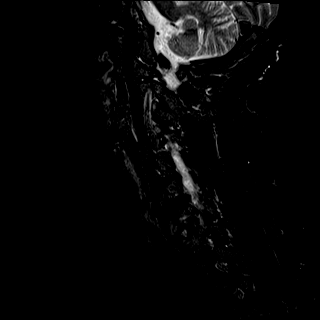
[im 13/13]
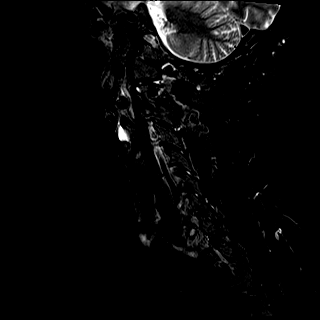

[Series 5: T2 · axial · 3.0mm · 0.62mm/px · z∈[-92,-0]mm · 12 of 27 slices shown (2 of 2)]
[im 1/27]
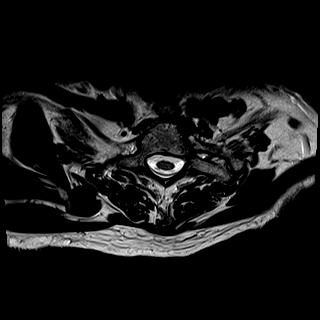
[im 3/27]
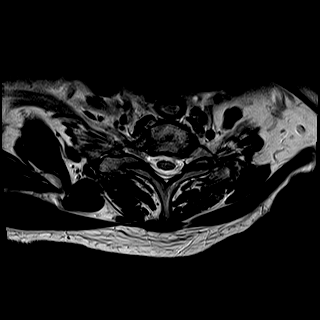
[im 5/27]
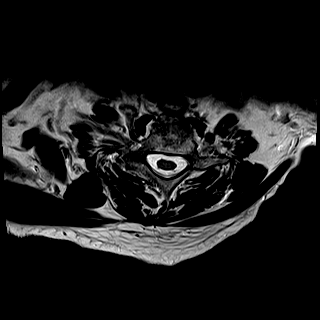
[im 7/27]
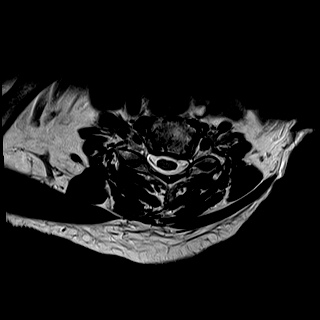
[im 9/27]
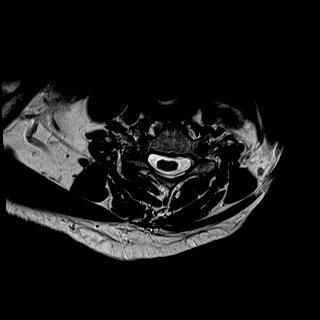
[im 11/27]
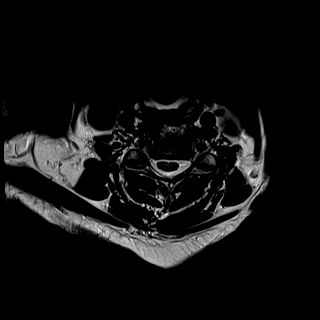
[im 13/27]
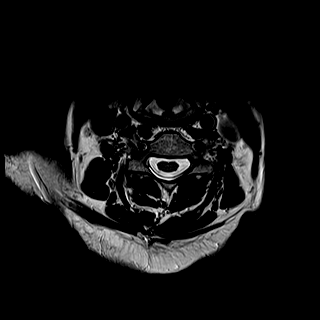
[im 15/27]
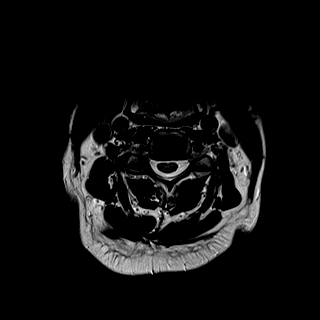
[im 17/27]
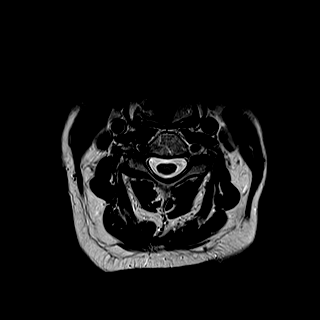
[im 19/27]
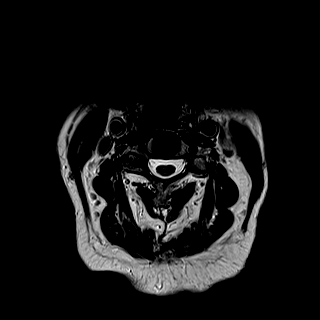
[im 23/27]
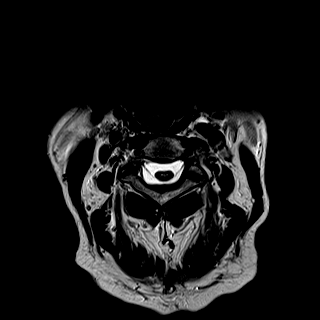
[im 27/27]
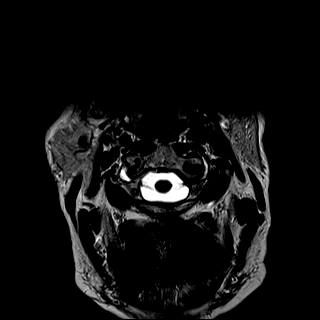

[Series 6: mpgr ax · axial · 3.0mm · 0.35mm/px · z∈[-78,+14]mm · 8 of 27 slices shown]
[im 1/27]
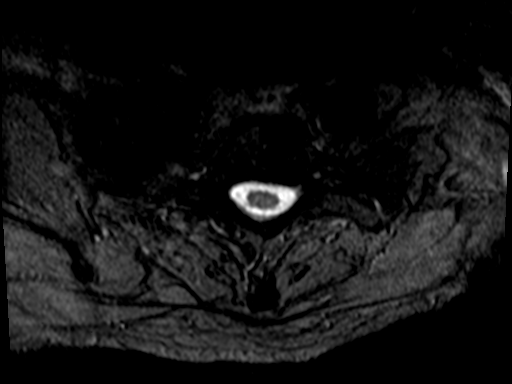
[im 5/27]
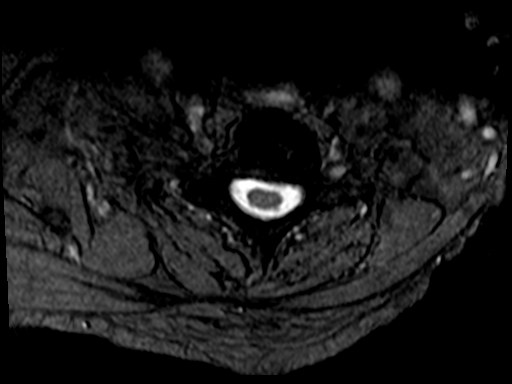
[im 9/27]
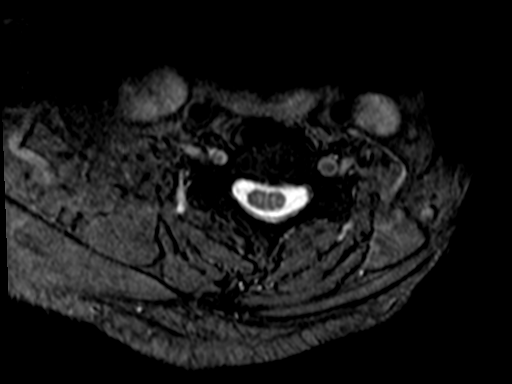
[im 13/27]
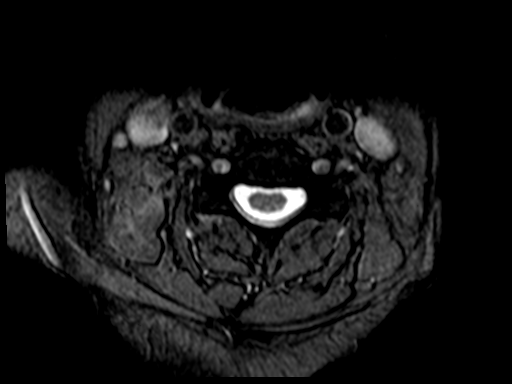
[im 15/27]
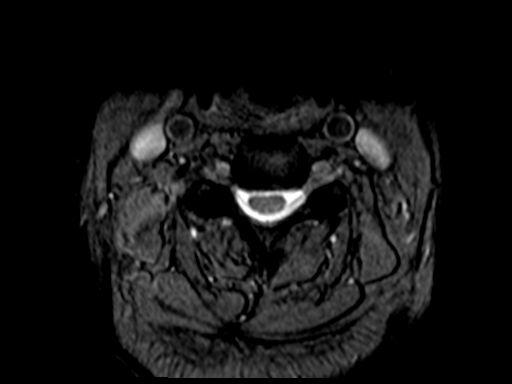
[im 19/27]
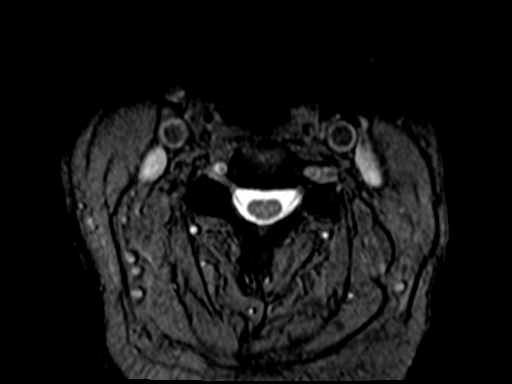
[im 23/27]
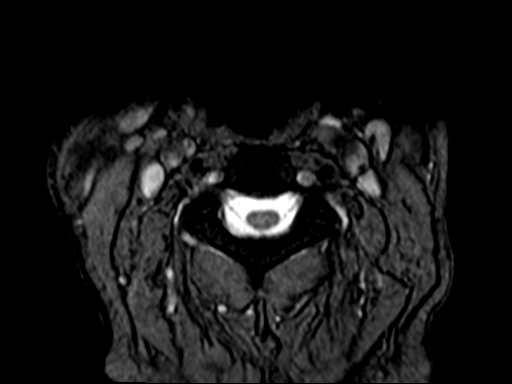
[im 27/27]
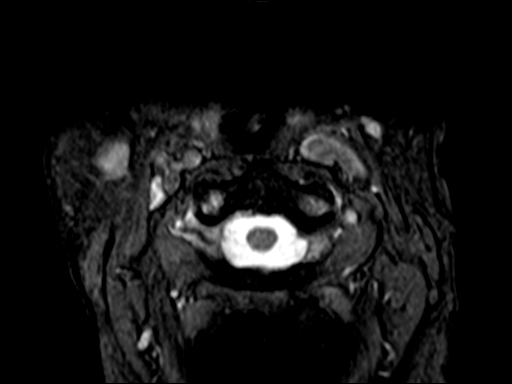

[40 of 48 positions shown; findings below may reference images not displayed]

FINDINGS: Alignment: Straightening of lordosis.  Trace C2-3 retrolisthesis.

Vertebrae: Modic type [DATE] endplate degenerative changes. No fracture
or aggressive osseous lesion.

Cord: Normal signal and morphology.

Posterior Fossa, vertebral arteries: Negative.

Disc levels: Multilevel desiccation and disc space loss.

C2-3: No significant disc bulge. Uncovertebral and facet
degenerative spurring. Patent spinal canal and neural foramen.

C3-4: Tiny central protrusion with uncovertebral and facet
degenerative spurring. Patent spinal canal and left neural foramen.
Mild to moderate right neural foraminal narrowing.

C4-5: Small disc osteophyte complex with uncovertebral and facet
degenerative spurring. Partial effacement of the ventral CSF
containing spaces. Mild bilateral neural foraminal narrowing.

C5-6: Disc osteophyte complex with central bulge abutting the
ventral cord. Uncovertebral and facet degenerative spurring. Mild
spinal canal, mild left and moderate right neural foraminal
narrowing.

C6-7: Disc osteophyte complex with superimposed left paracentral
protrusion. Uncovertebral and facet degenerative spurring. Patent
right neural foramen. Mild spinal canal and left neural foramen.

C7-T1: Shallow central protrusion with uncovertebral and facet
degenerative spurring. Patent spinal canal. Mild bilateral neural
foraminal narrowing.

Paraspinal tissues: Negative.
IMPRESSION: Mild C5-7 spinal canal narrowing.

Moderate right C3-4, right C5-6 neural foraminal narrowing.

Mild bilateral C4-5, left C5-6, left C6-7 and bilateral C7-T1 neural
foraminal narrowing.

## 2022-03-12 ENCOUNTER — Other Ambulatory Visit: Payer: Self-pay | Admitting: Family Medicine

## 2022-03-12 DIAGNOSIS — I10 Essential (primary) hypertension: Secondary | ICD-10-CM

## 2022-03-12 NOTE — Telephone Encounter (Signed)
Requested Prescriptions  Pending Prescriptions Disp Refills   lisinopril-hydrochlorothiazide (ZESTORETIC) 20-12.5 MG tablet [Pharmacy Med Name: LISINOPRIL-HCTZ 20-12.5 MG TAB] 90 tablet 0    Sig: TAKE 1 TABLET BY MOUTH EVERY DAY     Cardiovascular:  ACEI + Diuretic Combos Failed - 03/12/2022  1:49 AM      Failed - Cr in normal range and within 180 days    Creatinine, Ser  Date Value Ref Range Status  12/21/2021 1.25 (H) 0.61 - 1.24 mg/dL Final         Passed - Na in normal range and within 180 days    Sodium  Date Value Ref Range Status  12/21/2021 139 135 - 145 mmol/L Final  02/03/2021 142 134 - 144 mmol/L Final         Passed - K in normal range and within 180 days    Potassium  Date Value Ref Range Status  12/21/2021 4.8 3.5 - 5.1 mmol/L Final    Comment:    HEMOLYSIS AT THIS LEVEL MAY AFFECT RESULT         Passed - eGFR is 30 or above and within 180 days    GFR calc Af Amer  Date Value Ref Range Status  06/28/2019 101 >59 mL/min/1.73 Final   GFR, Estimated  Date Value Ref Range Status  12/21/2021 >60 >60 mL/min Final    Comment:    (NOTE) Calculated using the CKD-EPI Creatinine Equation (2021)    eGFR  Date Value Ref Range Status  02/03/2021 70 >59 mL/min/1.73 Final         Passed - Patient is not pregnant      Passed - Last BP in normal range    BP Readings from Last 1 Encounters:  02/08/22 128/78         Passed - Valid encounter within last 6 months    Recent Outpatient Visits           1 month ago Type 2 diabetes mellitus without complication, without long-term current use of insulin ([place])   [facility] [place], [place], MD   2 months ago Right lower quadrant abdominal pain   [facility] [facility] [place], Deanna C, MD   5 months ago Essential (primary) hypertension   [facility], MD   9  months ago Type 2 diabetes mellitus without complication, without long-term current use of insulin ([place])   [place] [facility] [place], Deanna C, MD   11 months ago Mood changes   [place] [facility] [place], [place], MD       Future Appointments             In 5 months Juline Patch, MD [facility] at [facility], [facility]

## 2022-04-01 DIAGNOSIS — I71011 Dissection of aortic arch: Secondary | ICD-10-CM | POA: Diagnosis not present

## 2022-04-01 DIAGNOSIS — K4000 Bilateral inguinal hernia, with obstruction, without gangrene, not specified as recurrent: Secondary | ICD-10-CM | POA: Diagnosis not present

## 2022-04-01 DIAGNOSIS — I10 Essential (primary) hypertension: Secondary | ICD-10-CM | POA: Diagnosis not present

## 2022-04-01 DIAGNOSIS — G3184 Mild cognitive impairment, so stated: Secondary | ICD-10-CM | POA: Diagnosis not present

## 2022-04-01 DIAGNOSIS — Z87898 Personal history of other specified conditions: Secondary | ICD-10-CM | POA: Diagnosis not present

## 2022-04-01 DIAGNOSIS — K5900 Constipation, unspecified: Secondary | ICD-10-CM | POA: Diagnosis not present

## 2022-04-01 DIAGNOSIS — E782 Mixed hyperlipidemia: Secondary | ICD-10-CM | POA: Diagnosis not present

## 2022-04-01 DIAGNOSIS — E119 Type 2 diabetes mellitus without complications: Secondary | ICD-10-CM | POA: Diagnosis not present

## 2022-04-01 DIAGNOSIS — R9431 Abnormal electrocardiogram [ECG] [EKG]: Secondary | ICD-10-CM | POA: Diagnosis not present

## 2022-04-01 DIAGNOSIS — I700 Atherosclerosis of aorta: Secondary | ICD-10-CM | POA: Diagnosis not present

## 2022-04-01 DIAGNOSIS — Q433 Congenital malformations of intestinal fixation: Secondary | ICD-10-CM | POA: Diagnosis not present

## 2022-04-01 DIAGNOSIS — F339 Major depressive disorder, recurrent, unspecified: Secondary | ICD-10-CM | POA: Diagnosis not present

## 2022-04-01 DIAGNOSIS — Z01818 Encounter for other preprocedural examination: Secondary | ICD-10-CM | POA: Diagnosis not present

## 2022-04-06 DIAGNOSIS — K4090 Unilateral inguinal hernia, without obstruction or gangrene, not specified as recurrent: Secondary | ICD-10-CM | POA: Diagnosis not present

## 2022-04-06 DIAGNOSIS — Z8679 Personal history of other diseases of the circulatory system: Secondary | ICD-10-CM | POA: Diagnosis not present

## 2022-04-06 DIAGNOSIS — N200 Calculus of kidney: Secondary | ICD-10-CM | POA: Diagnosis not present

## 2022-04-06 DIAGNOSIS — Z01818 Encounter for other preprocedural examination: Secondary | ICD-10-CM | POA: Diagnosis not present

## 2022-04-06 DIAGNOSIS — Z95828 Presence of other vascular implants and grafts: Secondary | ICD-10-CM | POA: Diagnosis not present

## 2022-04-06 DIAGNOSIS — Z09 Encounter for follow-up examination after completed treatment for conditions other than malignant neoplasm: Secondary | ICD-10-CM | POA: Diagnosis not present

## 2022-04-08 DIAGNOSIS — G3184 Mild cognitive impairment, so stated: Secondary | ICD-10-CM | POA: Diagnosis not present

## 2022-04-08 DIAGNOSIS — F09 Unspecified mental disorder due to known physiological condition: Secondary | ICD-10-CM | POA: Diagnosis not present

## 2022-04-08 DIAGNOSIS — G301 Alzheimer's disease with late onset: Secondary | ICD-10-CM | POA: Diagnosis not present

## 2022-04-12 ENCOUNTER — Ambulatory Visit: Payer: PPO | Attending: Cardiology | Admitting: Cardiology

## 2022-04-12 ENCOUNTER — Encounter: Payer: Self-pay | Admitting: Cardiology

## 2022-04-12 VITALS — BP 128/90 | HR 56 | Ht 73.0 in | Wt 176.2 lb

## 2022-04-12 DIAGNOSIS — I10 Essential (primary) hypertension: Secondary | ICD-10-CM | POA: Diagnosis not present

## 2022-04-12 DIAGNOSIS — E7800 Pure hypercholesterolemia, unspecified: Secondary | ICD-10-CM | POA: Diagnosis not present

## 2022-04-12 DIAGNOSIS — Z01810 Encounter for preprocedural cardiovascular examination: Secondary | ICD-10-CM

## 2022-04-12 DIAGNOSIS — I71011 Dissection of aortic arch: Secondary | ICD-10-CM

## 2022-04-12 NOTE — Progress Notes (Signed)
Cardiology Office Note:    Date:  04/12/2022   ID:  Andrew [name], DOB 21-Nov-1950, MRN [id number]  PCP:  Juline Patch, MD   [place]  Cardiologist:  Kate Sable, MD  Advanced Practice Provider:  No care team member to display Electrophysiologist:  None       Referring MD: Juline Patch, MD   Chief Complaint  Patient presents with   Other    12 month f/u needs cardiac clearance no complaints today. Meds reviewed verbally with pt.    History of Present Illness:    Andrew [name] is a 72 y.o. male with a hx of type A aortic dissection (s/p hemiarch repair with aortic valve replacement bioprosthetic at [facility]), hypertension, hyperlipidemia, diabetes who presents for follow-up.    Being seen for aortic valve replacement, hypertension.  He has bilateral inguinal hernia, repaired the plan.  Cardiac risk stratification/preop evaluation recommended.  Preop eval EKG showed sinus bradycardia left anterior hemiblock.  He denies chest pain or shortness of breath.  Compliant with medications as prescribed.  Echo 06/2020 EF 50%, normal aortic valve prosthesis  Past Medical History:  Diagnosis Date   Depression    Diabetes mellitus without complication ([place])    Hyperlipidemia    Hypertension     Past Surgical History:  Procedure Laterality Date   COLONOSCOPY WITH PROPOFOL N/A 07/13/2019   Procedure: COLONOSCOPY WITH PROPOFOL;  Surgeon: [name], MD;  Location: [facility] ENDOSCOPY;  Service: Endoscopy;  Laterality: N/A;   NASAL SINUS SURGERY     REPAIR OF ACUTE ASCENDING THORACIC AORTIC DISSECTION  12/2018    Current Medications: Current Meds  Medication Sig   amLODipine (NORVASC) 10 MG tablet Take 1 tablet (10 mg total) by mouth daily.   aspirin EC 81 MG tablet Take 81 mg by mouth daily. Swallow whole.   atorvastatin (LIPITOR) 40 MG tablet Take 1 tablet (40 mg total) by mouth daily.   glipiZIDE (GLUCOTROL XL) 2.5 MG 24 hr tablet Take 1  tablet (2.5 mg total) by mouth daily with breakfast.   linaclotide (LINZESS) 145 MCG CAPS capsule Take 1 capsule (145 mcg total) by mouth daily. (Patient taking differently: Take 145 mcg by mouth daily. GI DR Vicente Males)   lisinopril-hydrochlorothiazide (ZESTORETIC) 20-12.5 MG tablet TAKE 1 TABLET BY MOUTH EVERY DAY   memantine (NAMENDA) 5 MG tablet Take 1 tablet by mouth daily.   metFORMIN (GLUCOPHAGE) 1000 MG tablet Take 1 tablet (1,000 mg total) by mouth 2 (two) times daily.   OneTouch Delica Lancets 41O MISC TEST ONCE DAILY   ONETOUCH VERIO test strip TEST ONCE DAILY   QUEtiapine (SEROQUEL) 50 MG tablet Take 50-100 mg by mouth at bedtime. Dr. Wylene Simmer   SODIUM FLUORIDE 5000 PPM 1.1 % GEL dental gel Take by mouth as directed.   venlafaxine XR (EFFEXOR-XR) 150 MG 24 hr capsule Take 1 capsule (150 mg total) by mouth daily with breakfast.   venlafaxine XR (EFFEXOR-XR) 37.5 MG 24 hr capsule Take 1 capsule (37.5 mg total) by mouth daily with breakfast.     Allergies:   Patient has no known allergies.   Social History   Socioeconomic History   Marital status: Married    Spouse name: Not on file   Number of children: 1   Years of education: Not on file   Highest education level: Associate degree: academic program  Occupational History   Occupation: Retired  Tobacco Use   Smoking status: Never  Passive exposure: Never   Smokeless tobacco: Never   Tobacco comments:    smoking cessation materials not required  Vaping Use   Vaping Use: Never used  Substance and Sexual Activity   Alcohol use: Not Currently    Alcohol/week: 5.0 standard drinks of alcohol    Types: 2 Glasses of wine, 3 Cans of beer per week    Comment: weekly   Drug use: No   Sexual activity: Not Currently  Other Topics Concern   Not on file  Social History Narrative   Not on file   Social Determinants of Health   Financial Resource Strain: Low Risk  (05/13/2021)   Overall Financial Resource Strain (CARDIA)     Difficulty of Paying Living Expenses: Not hard at all  Food Insecurity: No Food Insecurity (05/13/2021)   Hunger Vital Sign    Worried About Running Out of Food in the Last Year: Never true    Ran Out of Food in the Last Year: Never true  Transportation Needs: No Transportation Needs (05/13/2021)   PRAPARE - Hydrologist (Medical): No    Lack of Transportation (Non-Medical): No  Physical Activity: Sufficiently Active (05/13/2021)   Exercise Vital Sign    Days of Exercise per Week: 5 days    Minutes of Exercise per Session: 40 min  Stress: No Stress Concern Present (05/13/2021)   [place]    Feeling of Stress : Not at all  Social Connections: Moderately Isolated (05/13/2021)   Social Connection and Isolation Panel [NHANES]    Frequency of Communication with Friends and Family: More than three times a week    Frequency of Social Gatherings with Friends and Family: Twice a week    Attends Religious Services: Never    Marine scientist or Organizations: No    Attends Music therapist: Never    Marital Status: Married     Family History: The patient's family history includes Healthy in his father; Heart disease in his mother.  ROS:   Please see the history of present illness.     All other systems reviewed and are negative.  EKGs/Labs/Other Studies Reviewed:    The following studies were reviewed today:   EKG:  EKG is  ordered today.  The ekg ordered today demonstrates sinus bradycardia, heart rate 56, left anterior hemiblock.  Recent Labs: 12/21/2021: ALT 33; BUN 30; Creatinine, Ser 1.25; Hemoglobin 16.1; Platelets 387; Potassium 4.8; Sodium 139  Recent Lipid Panel    Component Value Date/Time   CHOL 129 10/07/2021 1115   TRIG 80 10/07/2021 1115   HDL 53 10/07/2021 1115   CHOLHDL 3.3 07/31/2018 1131   LDLCALC 60 10/07/2021 1115     Risk Assessment/Calculations:       Physical Exam:    VS:  BP (!) 128/90 (BP Location: Left Arm, Patient Position: Sitting, Cuff Size: Normal)   Pulse (!) 56   Ht '6\' 1"'$  (1.854 m)   Wt 176 lb 4 oz (79.9 kg)   SpO2 97%   BMI 23.25 kg/m     Wt Readings from Last 3 Encounters:  04/12/22 176 lb 4 oz (79.9 kg)  02/08/22 170 lb (77.1 kg)  12/28/21 168 lb (76.2 kg)     GEN:  Well nourished, well developed in no acute distress HEENT: Normal NECK: No JVD; No carotid bruits CARDIAC: RRR, no murmurs, rubs, gallops RESPIRATORY:  Clear to auscultation without rales, wheezing  or rhonchi  ABDOMEN: Soft, non-tender, non-distended MUSCULOSKELETAL:  No edema; No deformity  SKIN: Warm and dry NEUROLOGIC:  Alert and oriented x 3 PSYCHIATRIC:  Normal affect   ASSESSMENT:    1. Pre-operative cardiovascular examination   2. 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 Primary hypertension   4. Pure hypercholesterolemia    PLAN:    In order of problems listed above:  Preop evaluation, inguinal hernia repair being planned.  EKG with left anterior hemiblock, denies chest pain or shortness of breath.  Echo with normal EF, obtain Lexiscan Myoview to evaluate any significant ischemia.  Okay to proceed with surgery if no significant ischemia is noted. History of aortic dissection status post hemiarch repair with bioprosthetic aortic valve replacement.  Last CT in 2022 showing stable aortic root, aorta size.  Follows with [facility] surgery for frequent CT scans.  echo 2022 with normal functioning bioprosthetic valve, ascending aorta measuring 35 mm.  Repeat echo in 1 year. Hypertension, BP controlled.  Continue Norvasc 10, lisinopril, HCTZ.Marland Kitchen Hyperlipidemia, continue Lipitor 40 mg daily.  Follow-up yearly.   Shared Decision Making/Informed Consent The risks [chest pain, shortness of breath, cardiac arrhythmias, dizziness, blood pressure fluctuations, myocardial infarction, stroke/transient ischemic attack, nausea, vomiting,  allergic reaction, radiation exposure, metallic taste sensation and life-threatening complications (estimated to be 1 in 10,000)], benefits (risk stratification, diagnosing coronary artery disease, treatment guidance) and alternatives of a nuclear stress test were discussed in detail with Mr. Faivre and he agrees to proceed.    Medication Adjustments/Labs and Tests Ordered: Current medicines are reviewed at length with the patient today.  Concerns regarding medicines are outlined above.  Orders Placed This Encounter  Procedures   NM Myocar Multi W/Spect W/Wall Motion / EF   EKG 12-Lead   ECHOCARDIOGRAM COMPLETE    No orders of the defined types were placed in this encounter.    Patient Instructions  Medication Instructions:   Your physician recommends that you continue on your current medications as directed. Please refer to the Current Medication list given to you today.  *If you need a refill on your cardiac medications before your next appointment, please call your pharmacy*   Lab Work:  None Ordered  If you have labs (blood work) drawn today and your tests are completely normal, you will receive your results only by: [place] (if you have MyChart) OR A paper copy in the mail If you have any lab test that is abnormal or we need to change your treatment, we will call you to review the results.   Testing/Procedures:  [place]  Your caregiver has ordered a Stress Test with nuclear imaging. The purpose of this test is to evaluate the blood supply to your heart muscle. This procedure is referred to as a "Non-Invasive Stress Test." This is because other than having an IV started in your vein, nothing is inserted or "invades" your body. Cardiac stress tests are done to find areas of poor blood flow to the heart by determining the extent of coronary artery disease (CAD). Some patients exercise on a treadmill, which naturally increases the blood flow to your heart, while  others who are  unable to walk on a treadmill due to physical limitations have a pharmacologic/chemical stress agent called Lexiscan . This medicine will mimic walking on a treadmill by temporarily increasing your coronary blood flow.   Please note: these test may take anywhere between 2-4 hours to complete  PLEASE REPORT TO [place]  THE VOLUNTEERS AT THE FIRST DESK WILL DIRECT YOU WHERE TO GO  Date of Procedure:_____________________________________  Arrival Time for Procedure:______________________________  Instructions regarding medication:   __XX__ : Hold diabetes (metformin) medication morning of procedure  PLEASE NOTIFY THE OFFICE AT LEAST 24 HOURS IN ADVANCE IF YOU ARE UNABLE TO KEEP YOUR APPOINTMENT.  [phone] AND  PLEASE NOTIFY NUCLEAR MEDICINE AT [facility] AT LEAST 24 HOURS IN ADVANCE IF YOU ARE UNABLE TO KEEP YOUR APPOINTMENT. [phone]  How to prepare for your Myoview test:  Do not eat or drink after midnight No caffeine for 24 hours prior to test No smoking 24 hours prior to test. Your medication may be taken with water.  If your doctor stopped a medication because of this test, do not take that medication. Ladies, please do not wear dresses.  Skirts or pants are appropriate. Please wear a short sleeve shirt. No perfume, cologne or lotion. Wear comfortable walking shoes. No heels!   Echocardiogram in 1 year prior to follow up appt  Your physician has requested that you have an echocardiogram. Echocardiography is a painless test that uses sound waves to create images of your heart. It provides your doctor with information about the size and shape of your heart and how well your heart's chambers and valves are working. This procedure takes approximately one hour. There are no restrictions for this procedure. Please note; depending on visual quality an IV may need to be placed.    Follow-Up: At [facility], you and your health needs are our  priority.  As part of our continuing mission to provide you with exceptional heart care, we have created designated Provider Care Teams.  These Care Teams include your primary Cardiologist (physician) and Advanced Practice Providers (APPs -  Physician Assistants and Nurse Practitioners) who all work together to provide you with the care you need, when you need it.  We recommend signing up for the patient portal called "MyChart".  Sign up information is provided on this After Visit Summary.  MyChart is used to connect with patients for Virtual Visits (Telemedicine).  Patients are able to view lab/test results, encounter notes, upcoming appointments, etc.  Non-urgent messages can be sent to your provider as well.   To learn more about what you can do with MyChart, go to [email or web address].    Your next appointment:   12 month(s)  Provider:   You may see Kate Sable, MD or one of the following Advanced Practice Providers on your designated Care Team:   Murray Hodgkins, NP Christell Faith, PA-C Cadence Kathlen Mody, PA-C Gerrie Nordmann, NP   Signed, Kate Sable, MD  04/12/2022 11:41 AM    [place]

## 2022-04-12 NOTE — Patient Instructions (Signed)
Medication Instructions:   Your physician recommends that you continue on your current medications as directed. Please refer to the Current Medication list given to you today.  *If you need a refill on your cardiac medications before your next appointment, please call your pharmacy*   Lab Work:  None Ordered  If you have labs (blood work) drawn today and your tests are completely normal, you will receive your results only by: [place] (if you have MyChart) OR A paper copy in the mail If you have any lab test that is abnormal or we need to change your treatment, we will call you to review the results.   Testing/Procedures:  [place]  Your caregiver has ordered a Stress Test with nuclear imaging. The purpose of this test is to evaluate the blood supply to your heart muscle. This procedure is referred to as a "Non-Invasive Stress Test." This is because other than having an IV started in your vein, nothing is inserted or "invades" your body. Cardiac stress tests are done to find areas of poor blood flow to the heart by determining the extent of coronary artery disease (CAD). Some patients exercise on a treadmill, which naturally increases the blood flow to your heart, while others who are  unable to walk on a treadmill due to physical limitations have a pharmacologic/chemical stress agent called Lexiscan . This medicine will mimic walking on a treadmill by temporarily increasing your coronary blood flow.   Please note: these test may take anywhere between 2-4 hours to complete  PLEASE REPORT TO [facility]  THE VOLUNTEERS AT THE FIRST DESK WILL DIRECT YOU WHERE TO GO  Date of Procedure:_____________________________________  Arrival Time for Procedure:______________________________  Instructions regarding medication:   __XX__ : Hold diabetes (metformin) medication morning of procedure  PLEASE NOTIFY THE OFFICE AT LEAST 24 HOURS IN ADVANCE IF YOU ARE UNABLE TO  KEEP YOUR APPOINTMENT.  [phone] AND  PLEASE NOTIFY NUCLEAR MEDICINE AT [facility] AT LEAST 24 HOURS IN ADVANCE IF YOU ARE UNABLE TO KEEP YOUR APPOINTMENT. [phone]  How to prepare for your Myoview test:  Do not eat or drink after midnight No caffeine for 24 hours prior to test No smoking 24 hours prior to test. Your medication may be taken with water.  If your doctor stopped a medication because of this test, do not take that medication. Ladies, please do not wear dresses.  Skirts or pants are appropriate. Please wear a short sleeve shirt. No perfume, cologne or lotion. Wear comfortable walking shoes. No heels!   Echocardiogram in 1 year prior to follow up appt  Your physician has requested that you have an echocardiogram. Echocardiography is a painless test that uses sound waves to create images of your heart. It provides your doctor with information about the size and shape of your heart and how well your heart's chambers and valves are working. This procedure takes approximately one hour. There are no restrictions for this procedure. Please note; depending on visual quality an IV may need to be placed.    Follow-Up: At [facility], you and your health needs are our priority.  As part of our continuing mission to provide you with exceptional heart care, we have created designated Provider Care Teams.  These Care Teams include your primary Cardiologist (physician) and Advanced Practice Providers (APPs -  Physician Assistants and Nurse Practitioners) who all work together to provide you with the care you need, when you need it.  We recommend signing  up for the patient portal called "MyChart".  Sign up information is provided on this After Visit Summary.  MyChart is used to connect with patients for Virtual Visits (Telemedicine).  Patients are able to view lab/test results, encounter notes, upcoming appointments, etc.  Non-urgent messages can be sent to your provider as well.   To  learn more about what you can do with MyChart, go to [email or web address].    Your next appointment:   12 month(s)  Provider:   You may see Kate Sable, MD or one of the following Advanced Practice Providers on your designated Care Team:   Murray Hodgkins, NP Christell Faith, PA-C Cadence Kathlen Mody, PA-C Gerrie Nordmann, NP

## 2022-04-19 ENCOUNTER — Telehealth: Payer: Self-pay | Admitting: Family Medicine

## 2022-04-19 NOTE — Telephone Encounter (Signed)
Copied from [place] [id number]. Topic: Medicare AWV >> Apr 19, 2022  2:03 PM Devoria Glassing wrote: Reason for CRM: Left message tor patient to reschedule Medicare Annual Wellness Visit (AWV) with [facility] Health Advisor.  Appointment can be an offiice/telephone or virtual visit;  Please call [phone] ask for [facility].

## 2022-04-20 ENCOUNTER — Encounter
Admission: RE | Admit: 2022-04-20 | Discharge: 2022-04-20 | Disposition: A | Discharge status: Home / Self Care | Payer: PPO | Source: Ambulatory Visit | Attending: Cardiology | Admitting: Cardiology

## 2022-04-20 DIAGNOSIS — Z01810 Encounter for preprocedural cardiovascular examination: Secondary | ICD-10-CM | POA: Insufficient documentation

## 2022-04-20 LAB — NM MYOCAR MULTI W/SPECT W/WALL MOTION / EF
Estimated workload: 1
Exercise duration (min): 0 min
Exercise duration (sec): 0 s
LV dias vol: 94 mL (ref 62–150)
LV sys vol: 38 mL
MPHR: 149 {beats}/min
Nuc Stress EF: 60 %
Peak HR: 96 {beats}/min
Percent HR: 64 %
Rest HR: 58 {beats}/min
Rest Nuclear Isotope Dose: 11 mCi
SDS: 0
SRS: 0
SSS: 1
ST Depression (mm): 0 mm
Stress Nuclear Isotope Dose: 30.7 mCi
TID: 0.86

## 2022-04-20 MED ORDER — TECHNETIUM TC 99M TETROFOSMIN IV KIT
11.0000 | PACK | Freq: Once | INTRAVENOUS | Status: AC | PRN
  Administered 2022-04-20: 11 via INTRAVENOUS

## 2022-04-20 MED ORDER — TECHNETIUM TC 99M TETROFOSMIN IV KIT
30.6600 | PACK | Freq: Once | INTRAVENOUS | Status: AC | PRN
  Administered 2022-04-20: 30.66 via INTRAVENOUS

## 2022-04-20 MED ORDER — REGADENOSON 0.4 MG/5ML IV SOLN
0.4000 mg | Freq: Once | INTRAVENOUS | Status: AC
  Administered 2022-04-20: 0.4 mg via INTRAVENOUS

## 2022-04-22 ENCOUNTER — Telehealth: Payer: Self-pay | Admitting: Cardiology

## 2022-04-22 NOTE — Telephone Encounter (Signed)
   Pre-operative Risk Assessment    Patient Name: Andrew [name]  DOB: May 03, 1950 MRN: [id number]      Request for Surgical Clearance    Procedure:   BILATERAL INGUINAL HERNIA REPAIR  Date of Surgery:  Clearance TBD                               Surgeon:  DR [place] Surgeon's Group or Practice Name:  [facility] Phone number:  [phone] Fax number:  [phone]   Type of Clearance Requested:   - Medical    Type of Anesthesia:  Not Indicated   Additional requests/questions:    Signed, Eli Phillips   04/22/2022, 11:03 AM

## 2022-04-22 NOTE — Telephone Encounter (Signed)
   Patient Name: Andrew [name]  DOB: [birth year] MRN: [id number]  Primary Cardiologist: Kate Sable, MD  Chart reviewed as part of pre-operative protocol coverage. Pre-op clearance already addressed by colleagues in earlier phone notes. To summarize recommendations:  -Stress test negative. Okay to proceed with hernia repair.   Will route this bundled recommendation to requesting provider via Epic fax function and remove from pre-op pool. Please call with questions.  Elgie Collard, PA-C 04/22/2022, 12:45 PM

## 2022-05-17 ENCOUNTER — Ambulatory Visit: Payer: PPO

## 2022-05-19 ENCOUNTER — Ambulatory Visit (INDEPENDENT_AMBULATORY_CARE_PROVIDER_SITE_OTHER): Payer: PPO

## 2022-05-19 VITALS — Ht 73.0 in | Wt 176.0 lb

## 2022-05-19 DIAGNOSIS — Z0000 Encounter for general adult medical examination without abnormal findings: Secondary | ICD-10-CM | POA: Diagnosis not present

## 2022-05-19 NOTE — Progress Notes (Signed)
I connected with  Andrew [name] on 05/19/22 by a audio enabled telemedicine application and verified that I am speaking with the correct person using two identifiers.  Patient Location: Home  Provider Location: Office/Clinic  I discussed the limitations of evaluation and management by telemedicine. The patient expressed understanding and agreed to proceed.  Subjective:   Andrew [name] is a 72 y.o. male who presents for Medicare Annual/Subsequent preventive examination.  Review of Systems     Cardiac Risk Factors include: advanced age (>38mn, >>85women);diabetes mellitus;dyslipidemia;male gender;hypertension     Objective:    There were no vitals filed for this visit. There is no height or weight on file to calculate BMI.     05/19/2022    9:03 AM 12/21/2021    1:31 PM 05/13/2021    9:32 AM 05/07/2020    9:41 AM 05/02/2020   11:29 AM 07/13/2019    7:13 AM 05/07/2019    9:37 AM  Advanced Directives  Does Patient Have a Medical Advance Directive? No No No No No No Yes  Type of ATransport plannerLiving will  Copy of HWhitmanin Chart?       No - copy requested  Would patient like information on creating a medical advance directive? No - Patient declined No - Patient declined Yes (MAU/Ambulatory/Procedural Areas - Information given) Yes (MAU/Ambulatory/Procedural Areas - Information given)  No - Patient declined     Current Medications (verified) Outpatient Encounter Medications as of 05/19/2022  Medication Sig   amLODipine (NORVASC) 10 MG tablet Take 1 tablet (10 mg total) by mouth daily.   aspirin EC 81 MG tablet Take 81 mg by mouth daily. Swallow whole.   atorvastatin (LIPITOR) 40 MG tablet Take 1 tablet (40 mg total) by mouth daily.   glipiZIDE (GLUCOTROL XL) 2.5 MG 24 hr tablet Take 1 tablet (2.5 mg total) by mouth daily with breakfast.   linaclotide (LINZESS) 145 MCG CAPS capsule Take 1 capsule (145 mcg total) by mouth  daily. (Patient taking differently: Take 145 mcg by mouth daily. GI DR AVicente Males   lisinopril-hydrochlorothiazide (ZESTORETIC) 20-12.5 MG tablet TAKE 1 TABLET BY MOUTH EVERY DAY   metFORMIN (GLUCOPHAGE) 1000 MG tablet Take 1 tablet (1,000 mg total) by mouth 2 (two) times daily.   OneTouch Delica Lancets [id number] TEST ONCE DAILY   ONETOUCH VERIO test strip TEST ONCE DAILY   SODIUM FLUORIDE 5000 PPM 1.1 % GEL dental gel Take by mouth as directed.   venlafaxine XR (EFFEXOR-XR) 150 MG 24 hr capsule Take 1 capsule (150 mg total) by mouth daily with breakfast.   venlafaxine XR (EFFEXOR-XR) 37.5 MG 24 hr capsule Take 1 capsule (37.5 mg total) by mouth daily with breakfast.   memantine (NAMENDA) 5 MG tablet Take 1 tablet by mouth daily.   QUEtiapine (SEROQUEL) 50 MG tablet Take 50-100 mg by mouth at bedtime. Dr. GWylene Simmer  No facility-administered encounter medications on file as of 05/19/2022.    Allergies (verified) Patient has no known allergies.   History: Past Medical History:  Diagnosis Date   Depression    Diabetes mellitus without complication [place]    Hyperlipidemia    Hypertension    Past Surgical History:  Procedure Laterality Date   COLONOSCOPY WITH PROPOFOL N/A 07/13/2019   Procedure: COLONOSCOPY WITH PROPOFOL;  Surgeon: WLucilla Lame MD;  Location: [facility] ENDOSCOPY;  Service: Endoscopy;  Laterality: N/A;   NASAL SINUS SURGERY  REPAIR OF ACUTE ASCENDING THORACIC AORTIC DISSECTION  12/2018   Family History  Problem Relation Age of Onset   Heart disease Mother    Healthy Father    Social History   Socioeconomic History   Marital status: Married    Spouse name: Not on file   Number of children: 1   Years of education: Not on file   Highest education level: Associate degree: academic program  Occupational History   Occupation: Retired  Tobacco Use   Smoking status: Never    Passive exposure: Never   Smokeless tobacco: Never   Tobacco comments:    smoking cessation  materials not required  Vaping Use   Vaping Use: Never used  Substance and Sexual Activity   Alcohol use: Not Currently    Alcohol/week: 5.0 standard drinks of alcohol    Types: 2 Glasses of wine, 3 Cans of beer per week    Comment: weekly   Drug use: No   Sexual activity: Not Currently  Other Topics Concern   Not on file  Social History Narrative   Not on file   Social Determinants of Health   Financial Resource Strain: Low Risk  (05/19/2022)   Overall Financial Resource Strain (CARDIA)    Difficulty of Paying Living Expenses: Not hard at all  Food Insecurity: No Food Insecurity (05/19/2022)   Hunger Vital Sign    Worried About Running Out of Food in the Last Year: Never true    Ran Out of Food in the Last Year: Never true  Transportation Needs: No Transportation Needs (05/19/2022)   PRAPARE - Hydrologist (Medical): No    Lack of Transportation (Non-Medical): No  Physical Activity: Insufficiently Active (05/19/2022)   Exercise Vital Sign    Days of Exercise per Week: 7 days    Minutes of Exercise per Session: 20 min  Stress: No Stress Concern Present (05/19/2022)   [place]    Feeling of Stress : Not at all  Social Connections: Moderately Isolated (05/19/2022)   Social Connection and Isolation Panel [NHANES]    Frequency of Communication with Friends and Family: More than three times a week    Frequency of Social Gatherings with Friends and Family: Once a week    Attends Religious Services: Never    Marine scientist or Organizations: No    Attends Music therapist: Never    Marital Status: Married    Tobacco Counseling Counseling given: Not Answered Tobacco comments: smoking cessation materials not required   Clinical Intake:  Pre-visit preparation completed: Yes  Pain : No/denies pain     Nutritional Risks: None Diabetes: Yes CBG done?: No Did  pt. bring in CBG monitor from home?: No  How often do you need to have someone help you when you read instructions, pamphlets, or other written materials from your doctor or pharmacy?: 1 - Never  Diabetic?yes Nutrition Risk Assessment:  Has the patient had any N/V/D within the last 2 months?  No  Does the patient have any non-healing wounds?  No  Has the patient had any unintentional weight loss or weight gain?  No   Diabetes:  Is the patient diabetic?  Yes  If diabetic, was a CBG obtained today?  No  Did the patient bring in their glucometer from home?  No  How often do you monitor your CBG's? occasionally.   Financial Strains and Diabetes Management:  Are  you having any financial strains with the device, your supplies or your medication? No .  Does the patient want to be seen by Chronic Care Management for management of their diabetes?  No  Would the patient like to be referred to a Nutritionist or for Diabetic Management?  No   Diabetic Exams:  Diabetic Eye Exam: Completed 08/03/21. Marland Kitchen Pt has been advised about the importance in completing this exam.  Diabetic Foot Exam: Completed 10/07/21. Pt has been advised about the importance in completing this exam.   Interpreter Needed?: No  Information entered by :: Kirke Shaggy, LPN   Activities of Daily Living    05/19/2022    9:04 AM  In your present state of health, do you have any difficulty performing the following activities:  Hearing? 1  Vision? 0  Difficulty concentrating or making decisions? 0  Walking or climbing stairs? 0  Dressing or bathing? 0  Doing errands, shopping? 0  Preparing Food and eating ? N  Using the Toilet? N  In the past six months, have you accidently leaked urine? N  Do you have problems with loss of bowel control? N  Managing your Medications? N  Managing your Finances? N  Housekeeping or managing your Housekeeping? N    Patient Care Team: Juline Patch, MD as PCP - General (Family  Medicine) Kate Sable, MD as PCP - Cardiology (Cardiology)  Indicate any recent Medical Services you may have received from other than [facility] providers in the past year (date may be approximate).     Assessment:   This is a routine wellness examination for Andrew [name].  Hearing/Vision screen Hearing Screening - Comments:: No aids Vision Screening - Comments:: Wears glasses- On [employer] .   Dietary issues and exercise activities discussed: Current Exercise Habits: Home exercise routine, Type of exercise: walking, Time (Minutes): 20, Frequency (Times/Week): 7, Weekly Exercise (Minutes/Week): 140, Intensity: Mild   Goals Addressed             This Visit's Progress    DIET - EAT MORE FRUITS AND VEGETABLES         Depression Screen    05/19/2022    9:01 AM 02/08/2022   10:51 AM 12/21/2021   11:36 AM 10/07/2021   10:25 AM 06/03/2021   10:11 AM 05/13/2021    9:30 AM 04/03/2021    8:50 AM  PHQ 2/9 Scores  PHQ - 2 Score 0 0 1 0 0 0 4  PHQ- 9 Score 0 0 6 0 0 0 7    Fall Risk    05/19/2022    9:04 AM 02/08/2022   10:51 AM 12/21/2021   11:36 AM 10/07/2021   10:24 AM 05/13/2021    9:34 AM  Fall Risk   Falls in the past year? 0 0 0 0 0  Number falls in past yr: 0 0 0 0 0  Injury with Fall? 0 0 0 0 0  Risk for fall due to : No Fall Risks No Fall Risks No Fall Risks No Fall Risks No Fall Risks  Follow up Falls prevention discussed;Falls evaluation completed Falls evaluation completed Falls evaluation completed Falls evaluation completed Falls prevention discussed    FALL RISK PREVENTION PERTAINING TO THE HOME:  Any stairs in or around the home? Yes  If so, are there any without handrails? No  Home free of loose throw rugs in walkways, pet beds, electrical cords, etc? Yes  Adequate lighting in your home to reduce risk of falls?  Yes   ASSISTIVE DEVICES UTILIZED TO PREVENT FALLS:  Life alert? No  Use of a cane, walker or w/c? No  Grab bars in the bathroom? Yes  Shower chair or  bench in shower? Yes  Elevated toilet seat or a handicapped toilet? No    Cognitive Function:        05/19/2022    9:08 AM 05/07/2019    9:44 AM 05/03/2018    1:55 PM 04/28/2017    2:57 PM  6CIT Screen  What Year? 0 points 0 points 0 points 0 points  What month? 0 points 0 points 0 points 0 points  What time? 0 points 0 points 0 points 0 points  Count back from 20 0 points 0 points 0 points 0 points  Months in reverse 2 points 0 points 0 points 0 points  Repeat phrase 8 points 2 points 0 points 0 points  Total Score 10 points 2 points 0 points 0 points    Immunizations Immunization History  Administered Date(s) Administered   Fluad Quad(high Dose 65+) 12/15/2021   Influenza, High Dose Seasonal PF 04/28/2017, 05/03/2018, 01/10/2020, 02/06/2021   Influenza,inj,Quad PF,6+ Mos 02/17/2015, 12/27/2018   Influenza-Unspecified 02/17/2015, 12/15/2021   PFIZER(Purple Top)SARS-COV-2 Vaccination 05/19/2019, 06/09/2019, 01/07/2020, 09/25/2020   Pfizer Covid-19 Vaccine Bivalent Booster 23yr & up 06/26/2021   Pneumococcal Conjugate-13 04/28/2017   Pneumococcal Polysaccharide-23 05/03/2018   Tdap 05/06/2017    TDAP status: Up to date  Flu Vaccine status: Up to date  Pneumococcal vaccine status: Up to date  Covid-19 vaccine status: Completed vaccines  Qualifies for Shingles Vaccine? Yes   Zostavax completed No   Shingrix Completed?: No.    Education has been provided regarding the importance of this vaccine. Patient has been advised to call insurance company to determine out of pocket expense if they have not yet received this vaccine. Advised may also receive vaccine at local pharmacy or Health Dept. Verbalized acceptance and understanding.  Screening Tests Health Maintenance  Topic Date Due   Zoster Vaccines- Shingrix (1 of 2) Never done   Diabetic kidney evaluation - Urine ACR  03/15/2017   COVID-19 Vaccine (6 - 2023-24 season) 11/27/2021   OPHTHALMOLOGY EXAM  08/04/2022    HEMOGLOBIN A1C  08/09/2022   FOOT EXAM  10/08/2022   Diabetic kidney evaluation - eGFR measurement  12/22/2022   Medicare Annual Wellness (AWV)  05/20/2023   DTaP/Tdap/Td (2 - Td or Tdap) 05/07/2027   COLONOSCOPY (Pts 45-467yrInsurance coverage will need to be confirmed)  07/12/2029   Pneumonia Vaccine 6546Years old  Completed   INFLUENZA VACCINE  Completed   HPV VACCINES  Aged Out   Hepatitis C Screening  Discontinued    Health Maintenance  Health Maintenance Due  Topic Date Due   Zoster Vaccines- Shingrix (1 of 2) Never done   Diabetic kidney evaluation - Urine ACR  03/15/2017   COVID-19 Vaccine (6 - 2023-24 season) 11/27/2021    Colorectal cancer screening: Type of screening: Colonoscopy. Completed 07/13/19. Repeat every 10 years  Lung Cancer Screening: (Low Dose CT Chest recommended if Age 71-80ears, 30 pack-year currently smoking OR have quit w/in 15years.) does not qualify.   Additional Screening:  Hepatitis C Screening: does qualify; Completed no  Vision Screening: Recommended annual ophthalmology exams for early detection of glaucoma and other disorders of the eye. Is the patient up to date with their annual eye exam?  Yes  Who is the provider or what is the name of the office  in which the patient attends annual eye exams? In [place] on [address]. If pt is not established with a provider, would they like to be referred to a provider to establish care? No .   Dental Screening: Recommended annual dental exams for proper oral hygiene  Community Resource Referral / Chronic Care Management: CRR required this visit?  No   CCM required this visit?  No      Plan:     I have personally reviewed and noted the following in the patient's chart:   Medical and social history Use of alcohol, tobacco or illicit drugs  Current medications and supplements including opioid prescriptions. Patient is not currently taking opioid prescriptions. Functional ability and  status Nutritional status Physical activity Advanced directives List of other physicians Hospitalizations, surgeries, and ER visits in previous 12 months Vitals Screenings to include cognitive, depression, and falls Referrals and appointments  In addition, I have reviewed and discussed with patient certain preventive protocols, quality metrics, and best practice recommendations. A written personalized care plan for preventive services as well as general preventive health recommendations were provided to patient.     Dionisio David, LPN   [id number]   Nurse Notes: none

## 2022-05-19 NOTE — Patient Instructions (Signed)
Andrew [name] , Thank you for taking time to come for your Medicare Wellness Visit. I appreciate your ongoing commitment to your health goals. Please review the following plan we discussed and let me know if I can assist you in the future.   These are the goals we discussed:  Goals      DIET - EAT MORE FRUITS AND VEGETABLES     DIET - INCREASE WATER INTAKE     Recommend to drink at least 6-8 8oz glasses of water per day.     Patient Stated     Patient states he would like to maintain health with physical activity and healthy eating.      PharmD " I want to stay healthy and live longer"     [place] (see longitudinal plan of care for additional care plan information)  Current Barriers:  Chronic Disease Management support, education, and care coordination needs related to Hypertension,Hyperlipidemia and Diabetes.   Hypertension BP Readings from Last 3 Encounters:  10/29/19 (!) 130/78  07/13/19 (!) 151/91  06/28/19 138/78  Pharmacist Clinical Goal(s): Over the next 90 days, patient will work with PharmD and providers to maintain BP goal <130/80 Current regimen:  Metoprolol tartrate 50 mg bid Lisinopril/hctz 20-12.5 mg daily Amlodipine 10 mg daily Interventions:  Comprehensive medication review performed, medication list updated in electronic medical record Confirmed patient  is now taking metoprolol tartrate twice daily. Discussed the importance of home BP monitoring and proper technique   Patient self care activities - Over the next 90 days, patient will: Check BP 2-3 times weekly document, and provide at future appointments Ensure daily salt intake < 2300 mg/day Continue exercise regimen and healthy diet.  Hyperlipidemia Lab Results  Component Value Date/Time   [facility] 06/28/2019 03:28 PM  Pharmacist Clinical Goal(s): Over the next 90 days, patient will work with PharmD and providers to maintain LDL goal < 70 Current regimen:  Aspirin 81 mg EC  daily Atorvastatin 40 mg daily Interventions: Comprehensive medication review performed, medication list updated in electronic medical record Provided diet and exercise counseling.  Patient self care activities - Over the next 90 days, patient will: Continue exercise regimen. Goal 30 min/day 5 days/week.  Diabetes Lab Results  Component Value Date/Time   HGBA1C 5.9 (H) 06/28/2019 03:28 PM   HGBA1C 5.5 01/31/2019 09:28 AM  Pharmacist Clinical Goal(s): Over the next 90 days, patient will work with PharmD and providers to maintain A1c goal <7% Current regimen:  Glipizide xl 2.5 mg qd Metformin 1018m bid Interventions: Comprehensive medication review performed, medication list updated in electronic medical record Discussed the signs/symptoms of hypoglycemia. Reviewed goal glucose readings for an A1c of <7%, we want to see fasting sugars <130 and 2 hour after meal sugars <180.  Patient self care activities - Over the next 90 days, patient will: Check blood sugar once daily, document, and provide at future appointments Contact provider with any episodes of hypoglycemia   Please see past updates related to this goal by clicking on the "Past Updates" button in the selected goal           This is a list of the screening recommended for you and due dates:  Health Maintenance  Topic Date Due   Zoster (Shingles) Vaccine (1 of 2) Never done   Yearly kidney health urinalysis for diabetes  03/15/2017   COVID-19 Vaccine (6 - 2023-24 season) 11/27/2021   Eye exam for diabetics  08/04/2022   Hemoglobin A1C  08/09/2022  Complete foot exam   10/08/2022   Yearly kidney function blood test for diabetes  12/22/2022   Medicare Annual Wellness Visit  05/20/2023   DTaP/Tdap/Td vaccine (2 - Td or Tdap) 05/07/2027   Colon Cancer Screening  07/12/2029   Pneumonia Vaccine  Completed   Flu Shot  Completed   HPV Vaccine  Aged Out   Hepatitis C Screening: USPSTF Recommendation to screen - Ages  18-79 yo.  Discontinued    Advanced directives: no  Conditions/risks identified: none  Next appointment: Follow up in one year for your annual wellness visit. 05/25/23 @ 8:45 am by phone  Preventive Care 65 Years and Older, Male  Preventive care refers to lifestyle choices and visits with your health care provider that can promote health and wellness. What does preventive care include? A yearly physical exam. This is also called an annual well check. Dental exams once or twice a year. Routine eye exams. Ask your health care provider how often you should have your eyes checked. Personal lifestyle choices, including: Daily care of your teeth and gums. Regular physical activity. Eating a healthy diet. Avoiding tobacco and drug use. Limiting alcohol use. Practicing safe sex. Taking low doses of aspirin every day. Taking vitamin and mineral supplements as recommended by your health care provider. What happens during an annual well check? The services and screenings done by your health care provider during your annual well check will depend on your age, overall health, lifestyle risk factors, and family history of disease. Counseling  Your health care provider may ask you questions about your: Alcohol use. Tobacco use. Drug use. Emotional well-being. Home and relationship well-being. Sexual activity. Eating habits. History of falls. Memory and ability to understand (cognition). Work and work Statistician. Screening  You may have the following tests or measurements: Height, weight, and BMI. Blood pressure. Lipid and cholesterol levels. These may be checked every 5 years, or more frequently if you are over 20 years old. Skin check. Lung cancer screening. You may have this screening every year starting at age 75 if you have a 30-pack-year history of smoking and currently smoke or have quit within the past 15 years. Fecal occult blood test (FOBT) of the stool. You may have this test  every year starting at age 72. Flexible sigmoidoscopy or colonoscopy. You may have a sigmoidoscopy every 5 years or a colonoscopy every 10 years starting at age 70. Prostate cancer screening. Recommendations will vary depending on your family history and other risks. Hepatitis C blood test. Hepatitis B blood test. Sexually transmitted disease (STD) testing. Diabetes screening. This is done by checking your blood sugar (glucose) after you have not eaten for a while (fasting). You may have this done every 1-3 years. Abdominal aortic aneurysm (AAA) screening. You may need this if you are a current or former smoker. Osteoporosis. You may be screened starting at age 85 if you are at high risk. Talk with your health care provider about your test results, treatment options, and if necessary, the need for more tests. Vaccines  Your health care provider may recommend certain vaccines, such as: Influenza vaccine. This is recommended every year. Tetanus, diphtheria, and acellular pertussis (Tdap, Td) vaccine. You may need a Td booster every 10 years. Zoster vaccine. You may need this after age 26. Pneumococcal 13-valent conjugate (PCV13) vaccine. One dose is recommended after age 78. Pneumococcal polysaccharide (PPSV23) vaccine. One dose is recommended after age 85. Talk to your health care provider about which screenings and vaccines  you need and how often you need them. This information is not intended to replace advice given to you by your health care provider. Make sure you discuss any questions you have with your health care provider. Document Released: 04/11/2015 Document Revised: 12/03/2015 Document Reviewed: 01/14/2015 Elsevier Interactive Patient Education  2017 [place] Prevention in the Home Falls can cause injuries. They can happen to people of all ages. There are many things you can do to make your home safe and to help prevent falls. What can I do on the outside of my  home? Regularly fix the edges of walkways and driveways and fix any cracks. Remove anything that might make you trip as you walk through a door, such as a raised step or threshold. Trim any bushes or trees on the path to your home. Use bright outdoor lighting. Clear any walking paths of anything that might make someone trip, such as rocks or tools. Regularly check to see if handrails are loose or broken. Make sure that both sides of any steps have handrails. Any raised decks and porches should have guardrails on the edges. Have any leaves, snow, or ice cleared regularly. Use sand or salt on walking paths during winter. Clean up any spills in your garage right away. This includes oil or grease spills. What can I do in the bathroom? Use night lights. Install grab bars by the toilet and in the tub and shower. Do not use towel bars as grab bars. Use non-skid mats or decals in the tub or shower. If you need to sit down in the shower, use a plastic, non-slip stool. Keep the floor dry. Clean up any water that spills on the floor as soon as it happens. Remove soap buildup in the tub or shower regularly. Attach bath mats securely with double-sided non-slip rug tape. Do not have throw rugs and other things on the floor that can make you trip. What can I do in the bedroom? Use night lights. Make sure that you have a light by your bed that is easy to reach. Do not use any sheets or blankets that are too big for your bed. They should not hang down onto the floor. Have a firm chair that has side arms. You can use this for support while you get dressed. Do not have throw rugs and other things on the floor that can make you trip. What can I do in the kitchen? Clean up any spills right away. Avoid walking on wet floors. Keep items that you use a lot in easy-to-reach places. If you need to reach something above you, use a strong step stool that has a grab bar. Keep electrical cords out of the way. Do  not use floor polish or wax that makes floors slippery. If you must use wax, use non-skid floor wax. Do not have throw rugs and other things on the floor that can make you trip. What can I do with my stairs? Do not leave any items on the stairs. Make sure that there are handrails on both sides of the stairs and use them. Fix handrails that are broken or loose. Make sure that handrails are as long as the stairways. Check any carpeting to make sure that it is firmly attached to the stairs. Fix any carpet that is loose or worn. Avoid having throw rugs at the top or bottom of the stairs. If you do have throw rugs, attach them to the floor with carpet tape. Make sure  that you have a light switch at the top of the stairs and the bottom of the stairs. If you do not have them, ask someone to add them for you. What else can I do to help prevent falls? Wear shoes that: Do not have high heels. Have rubber bottoms. Are comfortable and fit you well. Are closed at the toe. Do not wear sandals. If you use a stepladder: Make sure that it is fully opened. Do not climb a closed stepladder. Make sure that both sides of the stepladder are locked into place. Ask someone to hold it for you, if possible. Clearly mark and make sure that you can see: Any grab bars or handrails. First and last steps. Where the edge of each step is. Use tools that help you move around (mobility aids) if they are needed. These include: Canes. Walkers. Scooters. Crutches. Turn on the lights when you go into a dark area. Replace any light bulbs as soon as they burn out. Set up your furniture so you have a clear path. Avoid moving your furniture around. If any of your floors are uneven, fix them. If there are any pets around you, be aware of where they are. Review your medicines with your doctor. Some medicines can make you feel dizzy. This can increase your chance of falling. Ask your doctor what other things that you can do to  help prevent falls. This information is not intended to replace advice given to you by your health care provider. Make sure you discuss any questions you have with your health care provider. Document Released: 01/09/2009 Document Revised: 08/21/2015 Document Reviewed: 04/19/2014 Elsevier Interactive Patient Education  2017 [employer].

## 2022-06-06 ENCOUNTER — Other Ambulatory Visit: Payer: Self-pay | Admitting: Family Medicine

## 2022-06-06 DIAGNOSIS — R4586 Emotional lability: Secondary | ICD-10-CM

## 2022-06-06 DIAGNOSIS — F339 Major depressive disorder, recurrent, unspecified: Secondary | ICD-10-CM

## 2022-06-06 DIAGNOSIS — I10 Essential (primary) hypertension: Secondary | ICD-10-CM

## 2022-06-07 NOTE — Telephone Encounter (Signed)
Requested medication (s) are due for refill today: yes  Requested medication (s) are on the active medication list: yes  Last refill:  effexor- 10/07/21 #90 1 refills, zestoretic- 03/12/22 #90 0 refills  Future visit scheduled: yes in 2 months   Notes to clinic:  protocol failed. Last labs 12/21/21 do you want to refill Rxs?     Requested Prescriptions  Pending Prescriptions Disp Refills   venlafaxine XR (EFFEXOR-XR) 37.5 MG 24 hr capsule [Pharmacy Med Name: VENLAFAXINE HCL ER 37.5 MG CAP] 90 capsule 1    Sig: TAKE 1 CAPSULE BY MOUTH DAILY WITH BREAKFAST.     Psychiatry: Antidepressants - SNRI - desvenlafaxine & venlafaxine Failed - 06/06/2022  1:06 AM      Failed - Cr in normal range and within 360 days    Creatinine, Ser  Date Value Ref Range Status  12/21/2021 1.25 (H) 0.61 - 1.24 mg/dL Final         Failed - Last BP in normal range    BP Readings from Last 1 Encounters:  04/12/22 (!) 128/90         Failed - Lipid Panel in normal range within the last 12 months    Cholesterol, Total  Date Value Ref Range Status  10/07/2021 129 100 - 199 mg/dL Final   LDL Chol Calc (NIH)  Date Value Ref Range Status  10/07/2021 60 0 - 99 mg/dL Final   HDL  Date Value Ref Range Status  10/07/2021 53 >39 mg/dL Final   Triglycerides  Date Value Ref Range Status  10/07/2021 80 0 - 149 mg/dL Final         Passed - Completed PHQ-2 or PHQ-9 in the last 360 days      Passed - Valid encounter within last 6 months    Recent Outpatient Visits           3 months ago Type 2 diabetes mellitus without complication, without long-term current use of insulin ([place])   [place] [facility] [place], Deanna C, MD   5 months ago Right lower quadrant abdominal pain   [place] [facility] [place], Deanna C, MD   8 months ago Essential (primary) hypertension   [place] [facility], MD   1 year ago Type 2 diabetes mellitus without complication, without long-term current use of insulin ([place])   [place] [facility] at [place], Deanna C, MD   1 year ago Mood changes   [place] at [place], Deanna C, MD       Future Appointments             In 2 months Juline Patch, MD [facility] at [facility], [place]             lisinopril-hydrochlorothiazide (ZESTORETIC) 20-12.5 MG tablet [Pharmacy Med Name: LISINOPRIL-HCTZ 20-12.5 MG TAB] 90 tablet 0    Sig: TAKE 1 TABLET BY MOUTH EVERY DAY     Cardiovascular:  ACEI + Diuretic Combos Failed - 06/06/2022  1:06 AM      Failed - Cr in normal range and within 180 days    Creatinine, Ser  Date Value Ref Range Status  12/21/2021 1.25 (H) 0.61 - 1.24 mg/dL Final         Failed -  Last BP in normal range    BP Readings from Last 1 Encounters:  04/12/22 (!) 128/90         Passed - Na in normal range and within 180 days    Sodium  Date Value Ref Range Status  12/21/2021 139 135 - 145 mmol/L Final  02/03/2021 142 134 - 144 mmol/L Final         Passed - K in normal range and within 180 days    Potassium  Date Value Ref Range Status  12/21/2021 4.8 3.5 - 5.1 mmol/L Final    Comment:    HEMOLYSIS AT THIS LEVEL MAY AFFECT RESULT         Passed - eGFR is 30 or above and within 180 days    GFR calc Af Amer  Date Value Ref Range Status  06/28/2019 101 >59 mL/min/1.73 Final   GFR, Estimated  Date Value Ref Range Status  12/21/2021 >60 >60 mL/min Final    Comment:    (NOTE) Calculated using the CKD-EPI Creatinine Equation (2021)    eGFR  Date Value Ref Range Status  02/03/2021 70 >59 mL/min/1.73 Final         Passed - Patient is not pregnant      Passed - Valid encounter within last 6 months    Recent Outpatient Visits           3 months ago Type 2 diabetes mellitus without  complication, without long-term current use of insulin ([place])   [place] [facility] [place] [facility] [place], [place], MD   5 months ago Right lower quadrant abdominal pain   [place] [facility] [place], Deanna C, MD   8 months ago Essential (primary) hypertension   [place] [facility] [place], [place], MD   1 year ago Type 2 diabetes mellitus without complication, without long-term current use of insulin ([place])   [place] [facility] [place], Deanna C, MD   1 year ago Mood changes   [place] at [place], [place], MD       Future Appointments             In 2 months Juline Patch, MD [place] at [facility], [facility]

## 2022-06-21 ENCOUNTER — Other Ambulatory Visit: Payer: Self-pay | Admitting: Family Medicine

## 2022-06-21 DIAGNOSIS — F339 Major depressive disorder, recurrent, unspecified: Secondary | ICD-10-CM

## 2022-06-22 NOTE — Telephone Encounter (Signed)
Requested Prescriptions  Pending Prescriptions Disp Refills   venlafaxine XR (EFFEXOR-XR) 150 MG 24 hr capsule [Pharmacy Med Name: VENLAFAXINE HCL ER 150 MG CAP] 90 capsule 0    Sig: TAKE 1 CAPSULE BY MOUTH DAILY WITH BREAKFAST.     Psychiatry: Antidepressants - SNRI - desvenlafaxine & venlafaxine Failed - 06/21/2022  2:31 AM      Failed - Cr in normal range and within 360 days    Creatinine, Ser  Date Value Ref Range Status  12/21/2021 1.25 (H) 0.61 - 1.24 mg/dL Final         Failed - Last BP in normal range    BP Readings from Last 1 Encounters:  04/12/22 (!) 128/90         Failed - Lipid Panel in normal range within the last 12 months    Cholesterol, Total  Date Value Ref Range Status  10/07/2021 129 100 - 199 mg/dL Final   LDL Chol Calc (NIH)  Date Value Ref Range Status  10/07/2021 60 0 - 99 mg/dL Final   HDL  Date Value Ref Range Status  10/07/2021 53 >39 mg/dL Final   Triglycerides  Date Value Ref Range Status  10/07/2021 80 0 - 149 mg/dL Final         Passed - Completed PHQ-2 or PHQ-9 in the last 360 days      Passed - Valid encounter within last 6 months    Recent Outpatient Visits           4 months ago Type 2 diabetes mellitus without complication, without long-term current use of insulin ([place])   [place] [facility] at [place], Deanna C, MD   6 months ago Right lower quadrant abdominal pain   [place] [facility] [place], Deanna C, MD   8 months ago Essential (primary) hypertension   [place] [facility] at [place], Deanna C, MD   1 year ago Type 2 diabetes mellitus without complication, without long-term current use of insulin ([place])    [facility] at [place], Deanna C, MD   1 year ago Mood changes   [place] at [place], [place], MD       Future  Appointments             In 1 month Juline Patch, MD [place] at [facility], [facility]

## 2022-07-08 DIAGNOSIS — G3184 Mild cognitive impairment, so stated: Secondary | ICD-10-CM | POA: Diagnosis not present

## 2022-08-09 ENCOUNTER — Ambulatory Visit (INDEPENDENT_AMBULATORY_CARE_PROVIDER_SITE_OTHER): Payer: PPO | Admitting: Family Medicine

## 2022-08-09 ENCOUNTER — Encounter: Payer: Self-pay | Admitting: Family Medicine

## 2022-08-09 VITALS — BP 120/68 | HR 64 | Ht 73.0 in | Wt 174.0 lb

## 2022-08-09 DIAGNOSIS — E119 Type 2 diabetes mellitus without complications: Secondary | ICD-10-CM | POA: Diagnosis not present

## 2022-08-09 DIAGNOSIS — Z7984 Long term (current) use of oral hypoglycemic drugs: Secondary | ICD-10-CM

## 2022-08-09 DIAGNOSIS — E7801 Familial hypercholesterolemia: Secondary | ICD-10-CM | POA: Diagnosis not present

## 2022-08-09 DIAGNOSIS — F339 Major depressive disorder, recurrent, unspecified: Secondary | ICD-10-CM | POA: Diagnosis not present

## 2022-08-09 DIAGNOSIS — I10 Essential (primary) hypertension: Secondary | ICD-10-CM | POA: Diagnosis not present

## 2022-08-09 DIAGNOSIS — R4586 Emotional lability: Secondary | ICD-10-CM

## 2022-08-09 MED ORDER — AMLODIPINE BESYLATE 10 MG PO TABS: 10.0000 mg | ORAL_TABLET | Freq: Every day | ORAL | 1 refills | Status: DC

## 2022-08-09 MED ORDER — ATORVASTATIN CALCIUM 40 MG PO TABS: 40.0000 mg | ORAL_TABLET | Freq: Every day | ORAL | 1 refills | Status: DC

## 2022-08-09 MED ORDER — METFORMIN HCL 1000 MG PO TABS: 1000.0000 mg | ORAL_TABLET | Freq: Two times a day (BID) | ORAL | 1 refills | Status: DC

## 2022-08-09 MED ORDER — GLIPIZIDE ER 2.5 MG PO TB24: 2.5000 mg | ORAL_TABLET | Freq: Every day | ORAL | 1 refills | Status: DC

## 2022-08-09 MED ORDER — VENLAFAXINE HCL ER 150 MG PO CP24: 150.0000 mg | ORAL_CAPSULE | Freq: Every day | ORAL | 0 refills | Status: DC

## 2022-08-09 MED ORDER — VENLAFAXINE HCL ER 37.5 MG PO CP24: 37.5000 mg | ORAL_CAPSULE | Freq: Every day | ORAL | 0 refills | Status: DC

## 2022-08-09 MED ORDER — LISINOPRIL-HYDROCHLOROTHIAZIDE 20-12.5 MG PO TABS: 1.0000 | ORAL_TABLET | Freq: Every day | ORAL | 0 refills | Status: DC

## 2022-08-09 NOTE — Progress Notes (Signed)
Date:  08/09/2022   Name:  Andrew [name]   DOB:  09-27-1950   MRN:  [id number]   Chief Complaint: Hypertension, Hyperlipidemia, Prediabetes, and Depression  Hypertension This is a chronic problem. The current episode started more than 1 year ago. The problem has been gradually improving since onset. The problem is controlled. Pertinent negatives include no chest pain, headaches, orthopnea, palpitations, peripheral edema, PND or shortness of breath. There are no associated agents to hypertension. Past treatments include calcium channel blockers, ACE inhibitors and diuretics. The current treatment provides moderate improvement. There are no compliance problems.  There is no history of angina, CVA, heart failure or PVD. There is no history of chronic renal disease, a hypertension causing med or renovascular disease.  Hyperlipidemia This is a chronic problem. The current episode started more than 1 year ago. The problem is controlled. Recent lipid tests were reviewed and are normal. He has no history of chronic renal disease, diabetes or hypothyroidism. Pertinent negatives include no chest pain, myalgias or shortness of breath. Current antihyperlipidemic treatment includes statins. The current treatment provides moderate improvement of lipids. There are no compliance problems.  Risk factors for coronary artery disease include dyslipidemia and hypertension.  Depression        This is a chronic problem.  The current episode started more than 1 year ago.   The onset quality is gradual.   The problem has been gradually improving since onset.  Associated symptoms include no decreased concentration, no fatigue, no helplessness, no hopelessness, does not have insomnia, not irritable, no restlessness, no decreased interest, no appetite change, no body aches, no myalgias, no headaches, no indigestion, not sad and no suicidal ideas.  Past treatments include SNRIs - Serotonin and norepinephrine reuptake inhibitors  (and seroquel).   Pertinent negatives include no hypothyroidism. Diabetes He presents for his follow-up diabetic visit. He has type 2 diabetes mellitus. Pertinent negatives for hypoglycemia include no headaches. Pertinent negatives for diabetes include no chest pain, no fatigue, no polydipsia and no polyuria. There are no hypoglycemic complications. Symptoms are stable. There are no diabetic complications. Pertinent negatives for diabetic complications include no CVA or PVD. Risk factors for coronary artery disease include dyslipidemia. Current diabetic treatment includes oral agent (dual therapy). He is following a generally healthy diet. Meal planning includes avoidance of concentrated sweets and carbohydrate counting. An ACE inhibitor/angiotensin II receptor blocker is being taken.    Lab Results  Component Value Date   NA 139 12/21/2021   K 4.8 12/21/2021   CO2 27 12/21/2021   GLUCOSE 113 (H) 12/21/2021   BUN 30 (H) 12/21/2021   CREATININE 1.25 (H) 12/21/2021   CALCIUM 9.9 12/21/2021   EGFR 70 02/03/2021   GFRNONAA >60 12/21/2021   Lab Results  Component Value Date   CHOL 129 10/07/2021   HDL 53 10/07/2021   LDLCALC 60 10/07/2021   TRIG 80 10/07/2021   CHOLHDL 3.3 07/31/2018   No results found for: "TSH" Lab Results  Component Value Date   HGBA1C 6.0 (H) 02/08/2022   Lab Results  Component Value Date   WBC 8.3 12/21/2021   HGB 16.1 12/21/2021   HCT 47.7 12/21/2021   MCV 95.8 12/21/2021   PLT 387 12/21/2021   Lab Results  Component Value Date   ALT 33 12/21/2021   AST 32 12/21/2021   ALKPHOS 55 12/21/2021   BILITOT 0.8 12/21/2021   No results found for: "25OHVITD2", "25OHVITD3", "VD25OH"   Review of Systems  Constitutional:  Negative for appetite change and fatigue.  HENT:  Negative for mouth sores, nosebleeds, postnasal drip, sinus pressure and sore throat.   Eyes:  Negative for visual disturbance.  Respiratory:  Negative for cough, chest tightness, shortness of  breath and wheezing.   Cardiovascular:  Negative for chest pain, palpitations, orthopnea and PND.  Gastrointestinal:  Negative for abdominal distention, abdominal pain and blood in stool.  Endocrine: Negative for polydipsia and polyuria.  Musculoskeletal:  Negative for myalgias.  Neurological:  Negative for headaches.  Hematological:  Negative for adenopathy.  Psychiatric/Behavioral:  Positive for depression. Negative for decreased concentration and suicidal ideas. The patient does not have insomnia.     Patient Active Problem List   Diagnosis Date Noted   Personal history of other malignant neoplasm of skin 10/06/2021   Aortic atherosclerosis (HCC) 05/02/2020   Screening for colon cancer    Aortic dissection proximal to innominate (HCC) 12/23/2018   Mixed hyperlipidemia 10/05/2016   Familial multiple lipoprotein-type hyperlipidemia 08/02/2014   Routine general medical examination at a health care facility 08/02/2014   Recurrent major depressive episodes (HCC) 08/02/2014   Essential (primary) hypertension 08/02/2014   Diabetes mellitus, type 2 (HCC) 08/02/2014    No Known Allergies  Past Surgical History:  Procedure Laterality Date   COLONOSCOPY WITH PROPOFOL N/A 07/13/2019   Procedure: COLONOSCOPY WITH PROPOFOL;  Surgeon: Midge Minium, MD;  Location: [facility] ENDOSCOPY;  Service: Endoscopy;  Laterality: N/A;   NASAL SINUS SURGERY     REPAIR OF ACUTE ASCENDING THORACIC AORTIC DISSECTION  12/2018    Social History   Tobacco Use   Smoking status: Never    Passive exposure: Never   Smokeless tobacco: Never   Tobacco comments:    smoking cessation materials not required  Vaping Use   Vaping Use: Never used  Substance Use Topics   Alcohol use: Not Currently    Alcohol/week: 5.0 standard drinks of alcohol    Types: 2 Glasses of wine, 3 Cans of beer per week    Comment: weekly   Drug use: No     Medication list has been reviewed and updated.  Current Meds  Medication Sig    amLODipine (NORVASC) 10 MG tablet Take 1 tablet (10 mg total) by mouth daily.   aspirin EC 81 MG tablet Take 81 mg by mouth daily. Swallow whole.   atorvastatin (LIPITOR) 40 MG tablet Take 1 tablet (40 mg total) by mouth daily.   glipiZIDE (GLUCOTROL XL) 2.5 MG 24 hr tablet Take 1 tablet (2.5 mg total) by mouth daily with breakfast.   linaclotide (LINZESS) 145 MCG CAPS capsule Take 1 capsule (145 mcg total) by mouth daily. (Patient taking differently: Take 145 mcg by mouth daily. GI DR Tobi Bastos)   lisinopril-hydrochlorothiazide (ZESTORETIC) 20-12.5 MG tablet TAKE 1 TABLET BY MOUTH EVERY DAY   memantine (NAMENDA) 5 MG tablet Take 1 tablet by mouth daily.   metFORMIN (GLUCOPHAGE) 1000 MG tablet Take 1 tablet (1,000 mg total) by mouth 2 (two) times daily.   OneTouch Delica Lancets 30G MISC TEST ONCE DAILY   ONETOUCH VERIO test strip TEST ONCE DAILY   QUEtiapine (SEROQUEL) 50 MG tablet Take 50-100 mg by mouth at bedtime. Dr. Shane Crutch   SODIUM FLUORIDE 5000 PPM 1.1 % GEL dental gel Take by mouth as directed.   venlafaxine XR (EFFEXOR-XR) 150 MG 24 hr capsule TAKE 1 CAPSULE BY MOUTH DAILY WITH BREAKFAST.   venlafaxine XR (EFFEXOR-XR) 37.5 MG 24 hr capsule TAKE 1 CAPSULE BY MOUTH DAILY WITH  BREAKFAST.       08/09/2022   10:04 AM 02/08/2022   10:52 AM 12/21/2021   11:41 AM 10/07/2021   10:25 AM  GAD 7 : Generalized Anxiety Score  Nervous, Anxious, on Edge 0 0 0 0  Control/stop worrying 0 0 1 0  Worry too much - different things 0 0 1 0  Trouble relaxing 0 0 1 0  Restless 0 0 1 0  Easily annoyed or irritable 0 0 0 0  Afraid - awful might happen 0 0 0 0  Total GAD 7 Score 0 0 4 0  Anxiety Difficulty Not difficult at all Not difficult at all Not difficult at all Not difficult at all       08/09/2022   10:04 AM 05/19/2022    9:01 AM 02/08/2022   10:51 AM  Depression screen PHQ 2/9  Decreased Interest 0 0 0  Down, Depressed, Hopeless 0 0 0  PHQ - 2 Score 0 0 0  Altered sleeping 0 0 0   Tired, decreased energy 0 0 0  Change in appetite 0 0 0  Feeling bad or failure about yourself  0 0 0  Trouble concentrating 0 0 0  Moving slowly or fidgety/restless 0 0 0  Suicidal thoughts 0 0 0  PHQ-9 Score 0 0 0  Difficult doing work/chores Not difficult at all Not difficult at all Not difficult at all    BP Readings from Last 3 Encounters:  08/09/22 120/68  04/12/22 (!) 128/90  02/08/22 128/78    Physical Exam Vitals and nursing note reviewed.  Constitutional:      General: He is not irritable. HENT:     Head: Normocephalic.     Right Ear: Tympanic membrane and external ear normal.     Left Ear: Tympanic membrane and external ear normal.     Nose: Nose normal. No congestion or rhinorrhea.     Mouth/Throat:     Mouth: Mucous membranes are moist.  Eyes:     General: No scleral icterus.       Right eye: No discharge.        Left eye: No discharge.     Conjunctiva/sclera: Conjunctivae normal.     Pupils: Pupils are equal, round, and reactive to light.  Neck:     Thyroid: No thyromegaly.     Vascular: No JVD.     Trachea: No tracheal deviation.  Cardiovascular:     Rate and Rhythm: Normal rate and regular rhythm.     Heart sounds: Normal heart sounds. No murmur heard.    No friction rub. No gallop.  Pulmonary:     Effort: No respiratory distress.     Breath sounds: Normal breath sounds. No wheezing or rales.  Abdominal:     General: Bowel sounds are normal.     Palpations: Abdomen is soft. There is no mass.     Tenderness: There is no abdominal tenderness. There is no guarding or rebound.  Musculoskeletal:        General: No tenderness. Normal range of motion.     Cervical back: Normal range of motion and neck supple.  Lymphadenopathy:     Cervical: No cervical adenopathy.  Skin:    General: Skin is warm.     Findings: No rash.  Neurological:     Mental Status: He is alert.     Wt Readings from Last 3 Encounters:  08/09/22 174 lb (78.9 kg)  05/19/22  176 lb (79.8 kg)  04/12/22 176 lb 4 oz (79.9 kg)    BP 120/68   Pulse 64   Ht 6\' 1"  (1.854 m)   Wt 174 lb (78.9 kg)   SpO2 94%   BMI 22.96 kg/m   Assessment and Plan: 1. Essential (primary) hypertension Chronic.  Controlled.  Stable.  Blood pressure 120/68.  Asymptomatic.  Tolerating medications well.  Continue amlodipine 10 mg once a day in combination lisinopril hydrochlorothiazide 20-12 0.5 once a day.  Will recheck blood pressure in 6 months we will check CMP for electrolytes and GFR. - amLODipine (NORVASC) 10 MG tablet; Take 1 tablet (10 mg total) by mouth daily.  Dispense: 90 tablet; Refill: 1 - lisinopril-hydrochlorothiazide (ZESTORETIC) 20-12.5 MG tablet; Take 1 tablet by mouth daily.  Dispense: 90 tablet; Refill: 0 - Comprehensive Metabolic Panel (CMET)  2. Familial hypercholesterolemia Chronic.  Controlled.  Stable.  Continue atorvastatin 40 mg once a day.  Will check lipid panel for current level of LDL control.  Will check microalbuminuria for glomerular nephropathy. - atorvastatin (LIPITOR) 40 MG tablet; Take 1 tablet (40 mg total) by mouth daily.  Dispense: 90 tablet; Refill: 1 - Lipid Panel With LDL/HDL Ratio - Microalbumin / creatinine urine ratio  3. Type 2 diabetes mellitus without complication, without long-term current use of insulin (HCC) Chronic.  Controlled.  Stable.  Tolerating medications well which includes glipizide XL 2.5 mg once a day and metformin 1 g twice a day.  Will check A1c for current status of control. - glipiZIDE (GLUCOTROL XL) 2.5 MG 24 hr tablet; Take 1 tablet (2.5 mg total) by mouth daily with breakfast.  Dispense: 90 tablet; Refill: 1 - metFORMIN (GLUCOPHAGE) 1000 MG tablet; Take 1 tablet (1,000 mg total) by mouth 2 (two) times daily.  Dispense: 180 tablet; Refill: 1 - HgB A1c - Comprehensive Metabolic Panel (CMET)  4. Recurrent major depressive episodes (HCC) Chronic.  Controlled.  Stable.  PHQ 0 GAD score is 0 continue venlafaxine Exar 150  mg once a day and venlafaxine XR 37.5 mg once a day as well. - venlafaxine XR (EFFEXOR-XR) 150 MG 24 hr capsule; Take 1 capsule (150 mg total) by mouth daily with breakfast.  Dispense: 90 capsule; Refill: 0 - venlafaxine XR (EFFEXOR-XR) 37.5 MG 24 hr capsule; Take 1 capsule (37.5 mg total) by mouth daily with breakfast.  Dispense: 90 capsule; Refill: 0  5. Mood changes Patient does have mood changes and he is also followed by behavioral health and is currently on Seroquel in addition to the venlafaxine. - venlafaxine XR (EFFEXOR-XR) 37.5 MG 24 hr capsule; Take 1 capsule (37.5 mg total) by mouth daily with breakfast.  Dispense: 90 capsule; Refill: 0     Elizabeth Sauer, MD

## 2022-08-10 LAB — HEMOGLOBIN A1C
Est. average glucose Bld gHb Est-mCnc: 126 mg/dL
Hgb A1c MFr Bld: 6 % — ABNORMAL HIGH (ref 4.8–5.6)

## 2022-08-10 LAB — MICROALBUMIN / CREATININE URINE RATIO
Creatinine, Urine: 106.6 mg/dL
Microalb/Creat Ratio: 461 mg/g creat — ABNORMAL HIGH (ref 0–29)
Microalbumin, Urine: 491.3 ug/mL

## 2022-08-10 LAB — COMPREHENSIVE METABOLIC PANEL
ALT: 21 IU/L (ref 0–44)
AST: 22 IU/L (ref 0–40)
Albumin/Globulin Ratio: 1.7 (ref 1.2–2.2)
Albumin: 4.7 g/dL (ref 3.8–4.8)
Alkaline Phosphatase: 77 IU/L (ref 44–121)
BUN/Creatinine Ratio: 23 (ref 10–24)
BUN: 22 mg/dL (ref 8–27)
Bilirubin Total: 0.6 mg/dL (ref 0.0–1.2)
CO2: 25 mmol/L (ref 20–29)
Calcium: 9.8 mg/dL (ref 8.6–10.2)
Chloride: 102 mmol/L (ref 96–106)
Creatinine, Ser: 0.95 mg/dL (ref 0.76–1.27)
Globulin, Total: 2.7 g/dL (ref 1.5–4.5)
Glucose: 107 mg/dL — ABNORMAL HIGH (ref 70–99)
Potassium: 4.3 mmol/L (ref 3.5–5.2)
Sodium: 143 mmol/L (ref 134–144)
Total Protein: 7.4 g/dL (ref 6.0–8.5)
eGFR: 86 mL/min/{1.73_m2} (ref >59)

## 2022-08-10 LAB — LIPID PANEL WITH LDL/HDL RATIO
Cholesterol, Total: 136 mg/dL (ref 100–199)
HDL: 59 mg/dL (ref >39)
LDL Chol Calc (NIH): 63 mg/dL (ref 0–99)
LDL/HDL Ratio: 1.1 ratio (ref 0.0–3.6)
Triglycerides: 68 mg/dL (ref 0–149)
VLDL Cholesterol Cal: 14 mg/dL (ref 5–40)

## 2022-09-14 DIAGNOSIS — E119 Type 2 diabetes mellitus without complications: Secondary | ICD-10-CM | POA: Diagnosis not present

## 2022-09-21 ENCOUNTER — Telehealth: Payer: Self-pay | Admitting: Family Medicine

## 2022-09-21 ENCOUNTER — Telehealth: Payer: Self-pay

## 2022-09-21 NOTE — Telephone Encounter (Signed)
Called back pt with podiatry number/ Dr Ether Griffins

## 2022-09-21 NOTE — Telephone Encounter (Signed)
Copied from CRM [id number]. Topic: General - Other >> Sep 21, 2022 12:55 PM Santiya F wrote: Reason for CRM: Pt is calling in requesting a callback from [place],

## 2022-10-04 DIAGNOSIS — M79675 Pain in left toe(s): Secondary | ICD-10-CM | POA: Diagnosis not present

## 2022-10-04 DIAGNOSIS — E119 Type 2 diabetes mellitus without complications: Secondary | ICD-10-CM | POA: Diagnosis not present

## 2022-10-04 DIAGNOSIS — M79674 Pain in right toe(s): Secondary | ICD-10-CM | POA: Diagnosis not present

## 2022-10-04 DIAGNOSIS — B351 Tinea unguium: Secondary | ICD-10-CM | POA: Diagnosis not present

## 2022-11-10 ENCOUNTER — Telehealth: Payer: Self-pay | Admitting: Family Medicine

## 2022-11-10 NOTE — Telephone Encounter (Signed)
Patient informed he seen [facility], gave patient their number to call and schedule appt at [phone].  - Pax Reasoner

## 2022-11-10 NOTE — Telephone Encounter (Signed)
Copied from CRM [id number]. Topic: Referral - Request for Referral >> Nov 10, 2022  8:38 AM Phill Myron wrote: Andrew [name] could not remember the name of the Urologist he visited earlier this year but he would like another appointment with them. Please advise

## 2022-11-23 ENCOUNTER — Ambulatory Visit: Payer: PPO | Admitting: Urology

## 2022-11-23 ENCOUNTER — Other Ambulatory Visit
Admission: RE | Admit: 2022-11-23 | Discharge: 2022-11-23 | Disposition: A | Discharge status: Home / Self Care | Payer: PPO | Attending: Urology | Admitting: Urology

## 2022-11-23 ENCOUNTER — Other Ambulatory Visit: Payer: Self-pay | Admitting: *Deleted

## 2022-11-23 ENCOUNTER — Encounter: Payer: Self-pay | Admitting: Urology

## 2022-11-23 VITALS — BP 126/77 | HR 71 | Ht 73.0 in | Wt 172.6 lb

## 2022-11-23 DIAGNOSIS — N23 Unspecified renal colic: Secondary | ICD-10-CM | POA: Diagnosis not present

## 2022-11-23 DIAGNOSIS — R82998 Other abnormal findings in urine: Secondary | ICD-10-CM

## 2022-11-23 LAB — URINALYSIS, COMPLETE (UACMP) WITH MICROSCOPIC
Glucose, UA: NEGATIVE mg/dL
Hgb urine dipstick: NEGATIVE
Ketones, ur: 15 mg/dL — AB
Leukocytes,Ua: NEGATIVE
Nitrite: NEGATIVE
Protein, ur: >300 mg/dL — AB
Specific Gravity, Urine: >1.03 — ABNORMAL HIGH (ref 1.005–1.030)
pH: 5.5 (ref 5.0–8.0)

## 2022-11-23 LAB — BLADDER SCAN AMB NON-IMAGING

## 2022-11-23 NOTE — Progress Notes (Signed)
   11/23/2022 2:48 PM   Andrew [name] [birth year] [id number]  Reason for visit: "Dark urine"  HPI: 72 year old male who I saw previously in September 2023 for minimally symptomatic peyronies disease as well as history of nephrolithiasis.  He made an appointment today for " dark urine" over the last 3 to 4 months.  Urine sample today is benign with no microscopic hematuria or evidence of infection.  He denies any gross hematuria.  He denies any urinary symptoms including dysuria, weak stream, sensation of incomplete emptying, and PVR today is normal at 14ml.  Reassurance provided  CT abdomen and pelvis with contrast from September 2023 shows no urologic abnormalities, no hydronephrosis  Follow-up with urology as needed   Sondra Come, MD  [facility] [address] [place], [place] [address] [phone]

## 2022-11-25 ENCOUNTER — Other Ambulatory Visit: Payer: Self-pay | Admitting: Family Medicine

## 2022-11-25 DIAGNOSIS — F339 Major depressive disorder, recurrent, unspecified: Secondary | ICD-10-CM

## 2022-11-25 DIAGNOSIS — R4586 Emotional lability: Secondary | ICD-10-CM

## 2022-11-25 NOTE — Telephone Encounter (Signed)
Requested Prescriptions  Pending Prescriptions Disp Refills   venlafaxine XR (EFFEXOR-XR) 37.5 MG 24 hr capsule [Pharmacy Med Name: VENLAFAXINE HCL ER 37.5 MG CAP] 90 capsule 0    Sig: TAKE 1 CAPSULE BY MOUTH DAILY WITH BREAKFAST.     Psychiatry: Antidepressants - SNRI - desvenlafaxine & venlafaxine Failed - 11/25/2022  1:46 AM      Failed - Lipid Panel in normal range within the last 12 months    Cholesterol, Total  Date Value Ref Range Status  08/09/2022 136 100 - 199 mg/dL Final   LDL Chol Calc (NIH)  Date Value Ref Range Status  08/09/2022 63 0 - 99 mg/dL Final   HDL  Date Value Ref Range Status  08/09/2022 59 >39 mg/dL Final   Triglycerides  Date Value Ref Range Status  08/09/2022 68 0 - 149 mg/dL Final         Passed - Cr in normal range and within 360 days    Creatinine, Ser  Date Value Ref Range Status  08/09/2022 0.95 0.76 - 1.27 mg/dL Final         Passed - Completed PHQ-2 or PHQ-9 in the last 360 days      Passed - Last BP in normal range    BP Readings from Last 1 Encounters:  11/23/22 126/77         Passed - Valid encounter within last 6 months    Recent Outpatient Visits           3 months ago Essential (primary) hypertension   [place] [facility], MD   9 months ago Type 2 diabetes mellitus without complication, without long-term current use of insulin (HCC)   [place] [facility], MD   11 months ago Right lower quadrant abdominal pain   [place] [facility], MD   1 year ago Essential (primary) hypertension   [place] [facility], MD   1 year ago Type 2 diabetes mellitus without complication, without long-term current use of insulin [facility])    [facility] at [facility], MD        Future Appointments             In 2 months Duanne Limerick, MD [facility] at [facility], [facility]

## 2022-11-29 ENCOUNTER — Other Ambulatory Visit: Payer: Self-pay | Admitting: Family Medicine

## 2022-11-29 DIAGNOSIS — I10 Essential (primary) hypertension: Secondary | ICD-10-CM

## 2022-12-12 ENCOUNTER — Other Ambulatory Visit: Payer: Self-pay | Admitting: Family Medicine

## 2022-12-12 DIAGNOSIS — F339 Major depressive disorder, recurrent, unspecified: Secondary | ICD-10-CM

## 2022-12-13 ENCOUNTER — Other Ambulatory Visit: Payer: Self-pay | Admitting: Family Medicine

## 2022-12-13 DIAGNOSIS — I10 Essential (primary) hypertension: Secondary | ICD-10-CM

## 2022-12-13 NOTE — Telephone Encounter (Signed)
Requested Prescriptions  Pending Prescriptions Disp Refills   venlafaxine XR (EFFEXOR-XR) 150 MG 24 hr capsule [Pharmacy Med Name: VENLAFAXINE HCL ER 150 MG CAP] 90 capsule 0    Sig: TAKE 1 CAPSULE BY MOUTH DAILY WITH BREAKFAST.     Psychiatry: Antidepressants - SNRI - desvenlafaxine & venlafaxine Failed - 12/12/2022  1:22 AM      Failed - Lipid Panel in normal range within the last 12 months    Cholesterol, Total  Date Value Ref Range Status  08/09/2022 136 100 - 199 mg/dL Final   LDL Chol Calc (NIH)  Date Value Ref Range Status  08/09/2022 63 0 - 99 mg/dL Final   HDL  Date Value Ref Range Status  08/09/2022 59 >39 mg/dL Final   Triglycerides  Date Value Ref Range Status  08/09/2022 68 0 - 149 mg/dL Final         Passed - Cr in normal range and within 360 days    Creatinine, Ser  Date Value Ref Range Status  08/09/2022 0.95 0.76 - 1.27 mg/dL Final         Passed - Completed PHQ-2 or PHQ-9 in the last 360 days      Passed - Last BP in normal range    BP Readings from Last 1 Encounters:  11/23/22 126/77         Passed - Valid encounter within last 6 months    Recent Outpatient Visits           4 months ago Essential (primary) hypertension   [place] [facility], MD   10 months ago Type 2 diabetes mellitus without complication, without long-term current use of insulin (HCC)   [place] [facility], MD   11 months ago Right lower quadrant abdominal pain   [place] [facility], MD   1 year ago Essential (primary) hypertension   [place] [facility], MD   1 year ago Type 2 diabetes mellitus without complication, without long-term current use of insulin [facility])   [place] [facility], MD        Future Appointments             In 1 month Duanne Limerick, MD [facility] at [facility]

## 2023-01-03 ENCOUNTER — Ambulatory Visit: Payer: Self-pay

## 2023-01-12 ENCOUNTER — Telehealth: Payer: Self-pay | Admitting: Family Medicine

## 2023-01-12 NOTE — Telephone Encounter (Signed)
Pt is calling in because he is requesting to be referred to another doctor to help with his Hernia. Pt says the other doctor he was referred to in [place] wants him to do multiple tests and he believes it is unnecessary. Please follow up with pt.

## 2023-01-13 NOTE — Telephone Encounter (Signed)
Called pt left VM to call back. What steps does the patient want to take next?  KP

## 2023-01-14 ENCOUNTER — Telehealth: Payer: Self-pay | Admitting: Family Medicine

## 2023-01-14 NOTE — Telephone Encounter (Signed)
Left patient a VM informing Dr Yetta Barre said we cannot place another referral. He needs to stay with the same general surgeon and follow their instructions for treatment of this.  - Ajaya Crutchfield

## 2023-01-14 NOTE — Telephone Encounter (Signed)
Copied from CRM [id number]. Topic: Referral - Request for Referral >> Jan 14, 2023  2:09 PM Patsy Lager T wrote: Has patient seen PCP for this complaint? Yes.   Referral for which specialty: General Surgeon Preferred provider/office: unknown Reason for referral: hernia in his groin area on the right and left side

## 2023-01-19 ENCOUNTER — Telehealth: Payer: Self-pay | Admitting: Family Medicine

## 2023-01-19 NOTE — Telephone Encounter (Signed)
Copied from CRM [id number]. Topic: Referral - Request for Referral >> Jan 19, 2023 11:04 AM Everette C wrote: Has patient seen PCP for this complaint? Yes.   *If NO, is insurance requiring patient see PCP for this issue before PCP can refer them? Referral for which specialty: Surgery  Preferred provider/office: Dr. Ranjan Iraq - [facility] fax [phone] Reason for referral: hernia surgery

## 2023-01-19 NOTE — Telephone Encounter (Signed)
Left patient a VM informing Dr Yetta Barre said we cannot place another referral. He needs to stay with the same general surgeon and follow their instructions for treatment of this.  - Ajaya Crutchfield

## 2023-01-30 ENCOUNTER — Other Ambulatory Visit: Payer: Self-pay | Admitting: Family Medicine

## 2023-01-30 DIAGNOSIS — I10 Essential (primary) hypertension: Secondary | ICD-10-CM

## 2023-02-10 ENCOUNTER — Ambulatory Visit (INDEPENDENT_AMBULATORY_CARE_PROVIDER_SITE_OTHER): Payer: PPO | Admitting: Family Medicine

## 2023-02-10 ENCOUNTER — Encounter: Payer: Self-pay | Admitting: Family Medicine

## 2023-02-10 VITALS — BP 138/82 | HR 60 | Resp 16 | Ht 73.0 in | Wt 176.0 lb

## 2023-02-10 DIAGNOSIS — K4020 Bilateral inguinal hernia, without obstruction or gangrene, not specified as recurrent: Secondary | ICD-10-CM | POA: Diagnosis not present

## 2023-02-10 DIAGNOSIS — E7801 Familial hypercholesterolemia: Secondary | ICD-10-CM | POA: Diagnosis not present

## 2023-02-10 DIAGNOSIS — E119 Type 2 diabetes mellitus without complications: Secondary | ICD-10-CM | POA: Diagnosis not present

## 2023-02-10 DIAGNOSIS — F339 Major depressive disorder, recurrent, unspecified: Secondary | ICD-10-CM | POA: Diagnosis not present

## 2023-02-10 DIAGNOSIS — Z7984 Long term (current) use of oral hypoglycemic drugs: Secondary | ICD-10-CM

## 2023-02-10 DIAGNOSIS — I10 Essential (primary) hypertension: Secondary | ICD-10-CM | POA: Diagnosis not present

## 2023-02-10 DIAGNOSIS — R4586 Emotional lability: Secondary | ICD-10-CM | POA: Diagnosis not present

## 2023-02-10 MED ORDER — VENLAFAXINE HCL ER 150 MG PO CP24
150.0000 mg | ORAL_CAPSULE | Freq: Every day | ORAL | 1 refills | Status: AC
Start: 2023-02-10 — End: ?

## 2023-02-10 MED ORDER — LISINOPRIL-HYDROCHLOROTHIAZIDE 20-12.5 MG PO TABS
1.0000 | ORAL_TABLET | Freq: Every day | ORAL | 1 refills | Status: DC
Start: 2023-02-10 — End: 2023-05-11

## 2023-02-10 MED ORDER — ATORVASTATIN CALCIUM 40 MG PO TABS
40.0000 mg | ORAL_TABLET | Freq: Every day | ORAL | 1 refills | Status: AC
Start: 2023-02-10 — End: ?

## 2023-02-10 MED ORDER — METFORMIN HCL 1000 MG PO TABS
1000.0000 mg | ORAL_TABLET | Freq: Two times a day (BID) | ORAL | 1 refills | Status: AC
Start: 2023-02-10 — End: ?

## 2023-02-10 MED ORDER — AMLODIPINE BESYLATE 10 MG PO TABS
10.0000 mg | ORAL_TABLET | Freq: Every day | ORAL | 1 refills | Status: AC
Start: 2023-02-10 — End: ?

## 2023-02-10 MED ORDER — GLIPIZIDE ER 2.5 MG PO TB24
2.5000 mg | ORAL_TABLET | Freq: Every day | ORAL | 1 refills | Status: AC
Start: 2023-02-10 — End: ?

## 2023-02-10 MED ORDER — ONETOUCH VERIO VI STRP
1.0000 | ORAL_STRIP | Freq: Every day | 1 refills | Status: AC
Start: 2023-02-10 — End: ?

## 2023-02-10 MED ORDER — VENLAFAXINE HCL ER 37.5 MG PO CP24
37.5000 mg | ORAL_CAPSULE | Freq: Every day | ORAL | 1 refills | Status: AC
Start: 2023-02-10 — End: ?

## 2023-02-10 NOTE — Progress Notes (Signed)
Date:  02/10/2023   Name:  Andrew [name]   DOB:  1950-12-18   MRN:  [id number]   Chief Complaint: No chief complaint on file.  Hypertension This is a chronic problem. The current episode started more than 1 year ago. The problem has been gradually improving since onset. The problem is controlled. Pertinent negatives include no blurred vision, chest pain, headaches, neck pain, palpitations or shortness of breath. There are no associated agents to hypertension. Past treatments include ACE inhibitors, diuretics and calcium channel blockers. The current treatment provides moderate improvement. There are no compliance problems.  There is no history of angina, kidney disease, CAD/MI, CVA, heart failure, left ventricular hypertrophy, PVD or retinopathy. There is no history of chronic renal disease, a hypertension causing med or renovascular disease.  Hyperlipidemia This is a chronic problem. The current episode started more than 1 year ago. The problem is controlled. Recent lipid tests were reviewed and are normal. He has no history of chronic renal disease. Pertinent negatives include no chest pain, focal sensory loss, focal weakness, leg pain, myalgias or shortness of breath. Current antihyperlipidemic treatment includes statins. The current treatment provides moderate improvement of lipids. There are no compliance problems.   Diabetes He presents for his follow-up diabetic visit. He has type 2 diabetes mellitus. His disease course has been stable. Pertinent negatives for hypoglycemia include no dizziness, headaches or nervousness/anxiousness. Pertinent negatives for diabetes include no blurred vision, no chest pain, no polydipsia, no polyphagia, no polyuria and no weight loss. There are no hypoglycemic complications. Symptoms are stable. There are no diabetic complications. Pertinent negatives for diabetic complications include no CVA, PVD or retinopathy. He is following a generally healthy diet. He  participates in exercise daily. An ACE inhibitor/angiotensin II receptor blocker is being taken.    Lab Results  Component Value Date   NA 143 08/09/2022   K 4.3 08/09/2022   CO2 25 08/09/2022   GLUCOSE 107 (H) 08/09/2022   BUN 22 08/09/2022   CREATININE 0.95 08/09/2022   CALCIUM 9.8 08/09/2022   EGFR 86 08/09/2022   GFRNONAA >60 12/21/2021   Lab Results  Component Value Date   CHOL 136 08/09/2022   HDL 59 08/09/2022   LDLCALC 63 08/09/2022   TRIG 68 08/09/2022   CHOLHDL 3.3 07/31/2018   No results found for: "TSH" Lab Results  Component Value Date   HGBA1C 6.0 (H) 08/09/2022   Lab Results  Component Value Date   WBC 8.3 12/21/2021   HGB 16.1 12/21/2021   HCT 47.7 12/21/2021   MCV 95.8 12/21/2021   PLT 387 12/21/2021   Lab Results  Component Value Date   ALT 21 08/09/2022   AST 22 08/09/2022   ALKPHOS 77 08/09/2022   BILITOT 0.6 08/09/2022   No results found for: "25OHVITD2", "25OHVITD3", "VD25OH"   Review of Systems  Constitutional:  Negative for chills, fever and weight loss.  HENT:  Negative for drooling, ear discharge, ear pain and sore throat.   Eyes:  Negative for blurred vision.  Respiratory:  Negative for cough, shortness of breath and wheezing.   Cardiovascular:  Negative for chest pain, palpitations and leg swelling.  Gastrointestinal:  Negative for abdominal pain, blood in stool, constipation, diarrhea and nausea.  Endocrine: Negative for polydipsia, polyphagia and polyuria.  Genitourinary:  Negative for dysuria, frequency, hematuria and urgency.  Musculoskeletal:  Negative for back pain, myalgias and neck pain.  Skin:  Negative for rash.  Allergic/Immunologic: Negative for environmental allergies.  Neurological:  Negative for dizziness, focal weakness and headaches.  Hematological:  Does not bruise/bleed easily.  Psychiatric/Behavioral:  Negative for suicidal ideas. The patient is not nervous/anxious.     Patient Active Problem List    Diagnosis Date Noted   Personal history of other malignant neoplasm of skin 10/06/2021   Aortic atherosclerosis (HCC) 05/02/2020   Screening for colon cancer    Aortic dissection proximal to innominate (HCC) 12/23/2018   Mixed hyperlipidemia 10/05/2016   Familial multiple lipoprotein-type hyperlipidemia 08/02/2014   Routine general medical examination at a health care facility 08/02/2014   Recurrent major depressive episodes (HCC) 08/02/2014   Essential (primary) hypertension 08/02/2014   Diabetes mellitus, type 2 (HCC) 08/02/2014    No Known Allergies  Past Surgical History:  Procedure Laterality Date   COLONOSCOPY WITH PROPOFOL N/A 07/13/2019   Procedure: COLONOSCOPY WITH PROPOFOL;  Surgeon: Midge Minium, MD;  Location: [facility] ENDOSCOPY;  Service: Endoscopy;  Laterality: N/A;   NASAL SINUS SURGERY     REPAIR OF ACUTE ASCENDING THORACIC AORTIC DISSECTION  12/2018    Social History   Tobacco Use   Smoking status: Never    Passive exposure: Never   Smokeless tobacco: Never   Tobacco comments:    smoking cessation materials not required  Vaping Use   Vaping status: Never Used  Substance Use Topics   Alcohol use: Not Currently    Alcohol/week: 5.0 standard drinks of alcohol    Types: 2 Glasses of wine, 3 Cans of beer per week    Comment: weekly   Drug use: No     Medication list has been reviewed and updated.  Current Meds  Medication Sig   amLODipine (NORVASC) 10 MG tablet TAKE 1 TABLET BY MOUTH EVERY DAY   aspirin EC 81 MG tablet Take 81 mg by mouth daily. Swallow whole.   atorvastatin (LIPITOR) 40 MG tablet Take 1 tablet (40 mg total) by mouth daily.   glipiZIDE (GLUCOTROL XL) 2.5 MG 24 hr tablet Take 1 tablet (2.5 mg total) by mouth daily with breakfast.   linaclotide (LINZESS) 145 MCG CAPS capsule Take 1 capsule (145 mcg total) by mouth daily. (Patient taking differently: Take 145 mcg by mouth daily. GI DR Tobi Bastos)   lisinopril-hydrochlorothiazide (ZESTORETIC) 20-12.5  MG tablet TAKE 1 TABLET BY MOUTH EVERY DAY   metFORMIN (GLUCOPHAGE) 1000 MG tablet Take 1 tablet (1,000 mg total) by mouth 2 (two) times daily.   OneTouch Delica Lancets 30G MISC TEST ONCE DAILY   ONETOUCH VERIO test strip TEST ONCE DAILY   QUEtiapine (SEROQUEL) 50 MG tablet Take 50-100 mg by mouth at bedtime. Dr. Shane Crutch   venlafaxine XR (EFFEXOR-XR) 150 MG 24 hr capsule TAKE 1 CAPSULE BY MOUTH DAILY WITH BREAKFAST.   venlafaxine XR (EFFEXOR-XR) 37.5 MG 24 hr capsule TAKE 1 CAPSULE BY MOUTH DAILY WITH BREAKFAST.       08/09/2022   10:04 AM 02/08/2022   10:52 AM 12/21/2021   11:41 AM 10/07/2021   10:25 AM  GAD 7 : Generalized Anxiety Score  Nervous, Anxious, on Edge 0 0 0 0  Control/stop worrying 0 0 1 0  Worry too much - different things 0 0 1 0  Trouble relaxing 0 0 1 0  Restless 0 0 1 0  Easily annoyed or irritable 0 0 0 0  Afraid - awful might happen 0 0 0 0  Total GAD 7 Score 0 0 4 0  Anxiety Difficulty Not difficult at all Not difficult at all Not  difficult at all Not difficult at all       02/10/2023   10:00 AM 08/09/2022   10:04 AM 05/19/2022    9:01 AM  Depression screen PHQ 2/9  Decreased Interest 0 0 0  Down, Depressed, Hopeless 0 0 0  PHQ - 2 Score 0 0 0  Altered sleeping  0 0  Tired, decreased energy  0 0  Change in appetite  0 0  Feeling bad or failure about yourself   0 0  Trouble concentrating  0 0  Moving slowly or fidgety/restless  0 0  Suicidal thoughts  0 0  PHQ-9 Score  0 0  Difficult doing work/chores  Not difficult at all Not difficult at all    BP Readings from Last 3 Encounters:  02/10/23 138/82  11/23/22 126/77  08/09/22 120/68    Physical Exam Vitals and nursing note reviewed.  HENT:     Head: Normocephalic.     Right Ear: Tympanic membrane, ear canal and external ear normal.     Left Ear: Tympanic membrane, ear canal and external ear normal.     Nose: Nose normal. No congestion or rhinorrhea.  Eyes:     General: No scleral icterus.        Right eye: No discharge.        Left eye: No discharge.     Conjunctiva/sclera: Conjunctivae normal.     Pupils: Pupils are equal, round, and reactive to light.  Neck:     Thyroid: No thyromegaly.     Vascular: No JVD.     Trachea: No tracheal deviation.  Cardiovascular:     Rate and Rhythm: Normal rate and regular rhythm.     Heart sounds: Normal heart sounds. No murmur heard.    No friction rub. No gallop.  Pulmonary:     Effort: No respiratory distress.     Breath sounds: Normal breath sounds. No wheezing, rhonchi or rales.  Abdominal:     General: Bowel sounds are normal.     Palpations: Abdomen is soft. There is no mass.     Tenderness: There is no abdominal tenderness. There is no guarding or rebound.  Musculoskeletal:        General: No tenderness or deformity. Normal range of motion.     Cervical back: Normal range of motion and neck supple.     Right lower leg: No edema.     Left lower leg: No edema.  Lymphadenopathy:     Cervical: No cervical adenopathy.  Skin:    General: Skin is warm.     Findings: No rash.  Neurological:     Mental Status: He is alert and oriented to person, place, and time.     Cranial Nerves: No cranial nerve deficit.     Deep Tendon Reflexes: Reflexes are normal and symmetric.     Wt Readings from Last 3 Encounters:  02/10/23 176 lb (79.8 kg)  11/23/22 172 lb 9.6 oz (78.3 kg)  08/09/22 174 lb (78.9 kg)    BP 138/82   Pulse 60   Resp 16   Ht 6\' 1"  (1.854 m)   Wt 176 lb (79.8 kg)   SpO2 96%   BMI 23.22 kg/m   Assessment and Plan: 1. Bilateral direct inguinal hernia Chronic.  Currently stable without pain but is in need of repair.  There was an incident that she the appointment for repair was scheduled but because of needing to be reevaluated by cardiology patient had  his surgery deferred and eventually cancel until cardiology was able to approve.  We will resubmit to have Dr. Craige Cotta done reevaluate in 2 repair inguinal hernias.   Patient is in agreement at this time and we will proceed with referral. - Ambulatory referral to General Surgery  2. Essential (primary) hypertension Chronic.  Controlled.  Stable.  Blood pressure 138/82.  Tolerating medication well.  Asymptomatic.  Continue amlodipine 10 mg once a day and lisinopril-hydrochlorothiazide 20-12.5 mg once a day.  Will check CMP with electrolytes. - amLODipine (NORVASC) 10 MG tablet; Take 1 tablet (10 mg total) by mouth daily.  Dispense: 90 tablet; Refill: 1 - lisinopril-hydrochlorothiazide (ZESTORETIC) 20-12.5 MG tablet; Take 1 tablet by mouth daily.  Dispense: 90 tablet; Refill: 1 - Comprehensive metabolic panel  3. Familial hypercholesterolemia Chronic.  Controlled.  Stable.  Continue atorvastatin 40 mg once a day.  Patient is asymptomatic without myalgias nor muscle weakness.  Continue atorvastatin 40 mg once a day.  Will check lipid panel for LDL control and CMP for all transaminases. - atorvastatin (LIPITOR) 40 MG tablet; Take 1 tablet (40 mg total) by mouth daily.  Dispense: 90 tablet; Refill: 1 - Lipid Panel With LDL/HDL Ratio - Comprehensive metabolic panel  4. Type 2 diabetes mellitus without complication, without long-term current use of insulin (HCC) Chronic.  Controlled.  Stable.  Currently in normal range with fasting glucoses with oral therapy of glipizide XL 2.5 mg once a day and metformin 1 g twice a day.  Will check CMP for electrolytes and GFR and microalbuminuria for protein spillage.  Will check A1c for current level of diabetic control. - glipiZIDE (GLUCOTROL XL) 2.5 MG 24 hr tablet; Take 1 tablet (2.5 mg total) by mouth daily with breakfast.  Dispense: 90 tablet; Refill: 1 - metFORMIN (GLUCOPHAGE) 1000 MG tablet; Take 1 tablet (1,000 mg total) by mouth 2 (two) times daily.  Dispense: 180 tablet; Refill: 1 - glucose blood (ONETOUCH VERIO) test strip; 1 each by Other route daily. for testing  Dispense: 100 strip; Refill: 1 - Hemoglobin A1c -  Comprehensive metabolic panel - Microalbumin / creatinine urine ratio  5. Recurrent major depressive episodes (HCC) Chronic.  Controlled.  Stable.  Continue current dosing of venlafaxine XR 187.5 mg once a day.  Patient is tolerating current dose and would like to remain on current dosing as well we will recheck in 6 months. - venlafaxine XR (EFFEXOR-XR) 150 MG 24 hr capsule; Take 1 capsule (150 mg total) by mouth daily with breakfast.  Dispense: 90 capsule; Refill: 1 - venlafaxine XR (EFFEXOR-XR) 37.5 MG 24 hr capsule; Take 1 capsule (37.5 mg total) by mouth daily with breakfast.  Dispense: 90 capsule; Refill: 1  6. Mood changes As noted above mood changes are under control with current dosing of venlafaxine XR. - venlafaxine XR (EFFEXOR-XR) 37.5 MG 24 hr capsule; Take 1 capsule (37.5 mg total) by mouth daily with breakfast.  Dispense: 90 capsule; Refill: 1     Elizabeth Sauer, MD

## 2023-02-11 ENCOUNTER — Encounter: Payer: Self-pay | Admitting: Family Medicine

## 2023-02-11 LAB — COMPREHENSIVE METABOLIC PANEL
ALT: 19 [IU]/L (ref 0–44)
AST: 26 [IU]/L (ref 0–40)
Albumin: 4.8 g/dL (ref 3.8–4.8)
Alkaline Phosphatase: 75 [IU]/L (ref 44–121)
BUN/Creatinine Ratio: 20 (ref 10–24)
BUN: 21 mg/dL (ref 8–27)
Bilirubin Total: 0.6 mg/dL (ref 0.0–1.2)
CO2: 25 mmol/L (ref 20–29)
Calcium: 9.8 mg/dL (ref 8.6–10.2)
Chloride: 104 mmol/L (ref 96–106)
Creatinine, Ser: 1.03 mg/dL (ref 0.76–1.27)
Globulin, Total: 2.6 g/dL (ref 1.5–4.5)
Glucose: 109 mg/dL — ABNORMAL HIGH (ref 70–99)
Potassium: 4.5 mmol/L (ref 3.5–5.2)
Sodium: 145 mmol/L — ABNORMAL HIGH (ref 134–144)
Total Protein: 7.4 g/dL (ref 6.0–8.5)
eGFR: 78 mL/min/{1.73_m2} (ref >59)

## 2023-02-11 LAB — LIPID PANEL WITH LDL/HDL RATIO
Cholesterol, Total: 138 mg/dL (ref 100–199)
HDL: 47 mg/dL (ref >39)
LDL Chol Calc (NIH): 72 mg/dL (ref 0–99)
LDL/HDL Ratio: 1.5 ratio (ref 0.0–3.6)
Triglycerides: 104 mg/dL (ref 0–149)
VLDL Cholesterol Cal: 19 mg/dL (ref 5–40)

## 2023-02-11 LAB — HEMOGLOBIN A1C
Est. average glucose Bld gHb Est-mCnc: 134 mg/dL
Hgb A1c MFr Bld: 6.3 % — ABNORMAL HIGH (ref 4.8–5.6)

## 2023-02-28 DIAGNOSIS — Z87820 Personal history of traumatic brain injury: Secondary | ICD-10-CM | POA: Diagnosis not present

## 2023-02-28 DIAGNOSIS — K4020 Bilateral inguinal hernia, without obstruction or gangrene, not specified as recurrent: Secondary | ICD-10-CM | POA: Diagnosis not present

## 2023-03-28 DIAGNOSIS — I10 Essential (primary) hypertension: Secondary | ICD-10-CM | POA: Diagnosis not present

## 2023-03-28 DIAGNOSIS — K4021 Bilateral inguinal hernia, without obstruction or gangrene, recurrent: Secondary | ICD-10-CM | POA: Diagnosis not present

## 2023-03-28 DIAGNOSIS — E1100 Type 2 diabetes mellitus with hyperosmolarity without nonketotic hyperglycemic-hyperosmolar coma (NKHHC): Secondary | ICD-10-CM | POA: Diagnosis not present

## 2023-03-28 DIAGNOSIS — I700 Atherosclerosis of aorta: Secondary | ICD-10-CM | POA: Diagnosis not present

## 2023-03-28 DIAGNOSIS — F339 Major depressive disorder, recurrent, unspecified: Secondary | ICD-10-CM | POA: Diagnosis not present

## 2023-03-28 DIAGNOSIS — E782 Mixed hyperlipidemia: Secondary | ICD-10-CM | POA: Diagnosis not present

## 2023-03-28 DIAGNOSIS — I71011 Dissection of aortic arch: Secondary | ICD-10-CM | POA: Diagnosis not present

## 2023-03-28 DIAGNOSIS — Z01818 Encounter for other preprocedural examination: Secondary | ICD-10-CM | POA: Diagnosis not present

## 2023-04-07 DIAGNOSIS — G3184 Mild cognitive impairment, so stated: Secondary | ICD-10-CM | POA: Diagnosis not present

## 2023-04-07 DIAGNOSIS — E119 Type 2 diabetes mellitus without complications: Secondary | ICD-10-CM | POA: Diagnosis not present

## 2023-04-12 DIAGNOSIS — E119 Type 2 diabetes mellitus without complications: Secondary | ICD-10-CM | POA: Diagnosis not present

## 2023-04-12 DIAGNOSIS — K4010 Bilateral inguinal hernia, with gangrene, not specified as recurrent: Secondary | ICD-10-CM | POA: Diagnosis not present

## 2023-04-12 DIAGNOSIS — Z7984 Long term (current) use of oral hypoglycemic drugs: Secondary | ICD-10-CM | POA: Diagnosis not present

## 2023-04-12 DIAGNOSIS — Z79899 Other long term (current) drug therapy: Secondary | ICD-10-CM | POA: Diagnosis not present

## 2023-04-12 DIAGNOSIS — I10 Essential (primary) hypertension: Secondary | ICD-10-CM | POA: Diagnosis not present

## 2023-04-12 DIAGNOSIS — D176 Benign lipomatous neoplasm of spermatic cord: Secondary | ICD-10-CM | POA: Diagnosis not present

## 2023-04-13 ENCOUNTER — Other Ambulatory Visit: Payer: PPO

## 2023-04-25 DIAGNOSIS — K4020 Bilateral inguinal hernia, without obstruction or gangrene, not specified as recurrent: Secondary | ICD-10-CM | POA: Diagnosis not present

## 2023-04-25 DIAGNOSIS — Z8719 Personal history of other diseases of the digestive system: Secondary | ICD-10-CM | POA: Diagnosis not present

## 2023-04-25 DIAGNOSIS — Z48815 Encounter for surgical aftercare following surgery on the digestive system: Secondary | ICD-10-CM | POA: Diagnosis not present

## 2023-05-04 DIAGNOSIS — E119 Type 2 diabetes mellitus without complications: Secondary | ICD-10-CM | POA: Diagnosis not present

## 2023-05-04 DIAGNOSIS — I71011 Dissection of aortic arch: Secondary | ICD-10-CM | POA: Diagnosis not present

## 2023-05-04 DIAGNOSIS — Z79899 Other long term (current) drug therapy: Secondary | ICD-10-CM | POA: Diagnosis not present

## 2023-05-04 DIAGNOSIS — K5730 Diverticulosis of large intestine without perforation or abscess without bleeding: Secondary | ICD-10-CM | POA: Diagnosis not present

## 2023-05-04 DIAGNOSIS — I10 Essential (primary) hypertension: Secondary | ICD-10-CM | POA: Diagnosis not present

## 2023-05-04 DIAGNOSIS — Z7982 Long term (current) use of aspirin: Secondary | ICD-10-CM | POA: Diagnosis not present

## 2023-05-04 DIAGNOSIS — E785 Hyperlipidemia, unspecified: Secondary | ICD-10-CM | POA: Diagnosis not present

## 2023-05-04 DIAGNOSIS — Z09 Encounter for follow-up examination after completed treatment for conditions other than malignant neoplasm: Secondary | ICD-10-CM | POA: Diagnosis not present

## 2023-05-04 DIAGNOSIS — I700 Atherosclerosis of aorta: Secondary | ICD-10-CM | POA: Diagnosis not present

## 2023-05-04 DIAGNOSIS — Z95828 Presence of other vascular implants and grafts: Secondary | ICD-10-CM | POA: Diagnosis not present

## 2023-05-11 ENCOUNTER — Encounter: Payer: Self-pay | Admitting: Cardiology

## 2023-05-11 ENCOUNTER — Ambulatory Visit: Payer: PPO | Attending: Cardiology | Admitting: Cardiology

## 2023-05-11 VITALS — BP 148/78 | HR 51 | Ht 73.0 in | Wt 177.2 lb

## 2023-05-11 DIAGNOSIS — E7800 Pure hypercholesterolemia, unspecified: Secondary | ICD-10-CM

## 2023-05-11 DIAGNOSIS — I10 Essential (primary) hypertension: Secondary | ICD-10-CM

## 2023-05-11 DIAGNOSIS — I71011 Dissection of aortic arch: Secondary | ICD-10-CM

## 2023-05-11 MED ORDER — LISINOPRIL-HYDROCHLOROTHIAZIDE 20-12.5 MG PO TABS
2.0000 | ORAL_TABLET | Freq: Every day | ORAL | 0 refills | Status: DC
Start: 2023-05-11 — End: 2023-06-27

## 2023-05-11 NOTE — Patient Instructions (Addendum)
Medication Instructions:   INCREASE lisinopril-hydrochlorothiazide (ZESTORETIC) 20-12.5 MG tablet - Take TWO tablets by mouth daily.   *If you need a refill on your cardiac medications before your next appointment, please call your pharmacy*   Lab Work:  None Ordered  If you have labs (blood work) drawn today and your tests are completely normal, you will receive your results only by: MyChart Message (if you have MyChart) OR A paper copy in the mail If you have any lab test that is abnormal or we need to change your treatment, we will call you to review the results.   Testing/Procedures:  Your physician has requested that you have an echocardiogram. Echocardiography is a painless test that uses sound waves to create images of your heart. It provides your doctor with information about the size and shape of your heart and how well your heart's chambers and valves are working. This procedure takes approximately one hour. There are no restrictions for this procedure. Please do NOT wear cologne, perfume, aftershave, or lotions (deodorant is allowed). Please arrive 15 minutes prior to your appointment time.  Please note: We ask at that you not bring children with you during ultrasound (echo/ vascular) testing. Due to room size and safety concerns, children are not allowed in the ultrasound rooms during exams. Our front office staff cannot provide observation of children in our lobby area while testing is being conducted. An adult accompanying a patient to their appointment will only be allowed in the ultrasound room at the discretion of the ultrasound technician under special circumstances. We apologize for any inconvenience.    Follow-Up: At [facility], you and your health needs are our priority.  As part of our continuing mission to provide you with exceptional heart care, we have created designated Provider Care Teams.  These Care Teams include your primary Cardiologist  (physician) and Advanced Practice Providers (APPs -  Physician Assistants and Nurse Practitioners) who all work together to provide you with the care you need, when you need it.  We recommend signing up for the patient portal called "MyChart".  Sign up information is provided on this After Visit Summary.  MyChart is used to connect with patients for Virtual Visits (Telemedicine).  Patients are able to view lab/test results, encounter notes, upcoming appointments, etc.  Non-urgent messages can be sent to your provider as well.   To learn more about what you can do with MyChart, go to [email or web address].    Your next appointment:   5 month(s)  Provider:   You may see Debbe Odea, MD or one of the following Advanced Practice Providers on your designated Care Team:   Nicolasa Ducking, NP Eula Listen, PA-C Cadence Fransico Michael, PA-C Charlsie Quest, NP Carlos Levering, NP

## 2023-05-11 NOTE — Progress Notes (Signed)
Cardiology Office Note:    Date:  05/11/2023   ID:  Andrew [name], DOB 05-28-1950, MRN [id number]  PCP:  Duanne Limerick, MD   [facility]  Cardiologist:  Debbe Odea, MD  Advanced Practice Provider:  No care team member to display Electrophysiologist:  None       Referring MD: Duanne Limerick, MD   Chief Complaint  Patient presents with   Follow-up    Patient denies new or acute cardiac problems/concerns today.      History of Present Illness:    Andrew [name] is a 73 y.o. male with a hx of type A aortic dissection (s/p hemiarch repair) at [facility], hypertension, hyperlipidemia, diabetes who presents for follow-up.    He feels well, denies chest pain or shortness of breath.  Blood pressures during physician checkups have been elevated with systolic in the 140s.  Wife endorses patient eats a poor cardiac diet.  Had a follow-up chest CT at [facility] about a week ago.  Aortic root size and ascending aorta was stable.  Prior notes/testing [employer] 03/2022 no significant ischemia, low risk study Echo 06/2020 EF 50%, normal aortic valve prosthesis  Past Medical History:  Diagnosis Date   Depression    Diabetes mellitus without complication (HCC)    Hyperlipidemia    Hypertension     Past Surgical History:  Procedure Laterality Date   COLONOSCOPY WITH PROPOFOL N/A 07/13/2019   Procedure: COLONOSCOPY WITH PROPOFOL;  Surgeon: Midge Minium, MD;  Location: [facility] ENDOSCOPY;  Service: Endoscopy;  Laterality: N/A;   NASAL SINUS SURGERY     REPAIR OF ACUTE ASCENDING THORACIC AORTIC DISSECTION  12/2018    Current Medications: Current Meds  Medication Sig   amLODipine (NORVASC) 10 MG tablet Take 1 tablet (10 mg total) by mouth daily.   aspirin EC 81 MG tablet Take 81 mg by mouth daily. Swallow whole.   atorvastatin (LIPITOR) 40 MG tablet Take 1 tablet (40 mg total) by mouth daily.   glipiZIDE (GLUCOTROL XL) 2.5 MG 24 hr tablet Take 1 tablet (2.5  mg total) by mouth daily with breakfast.   glucose blood (ONETOUCH VERIO) test strip 1 each by Other route daily. for testing   linaclotide (LINZESS) 145 MCG CAPS capsule Take 1 capsule (145 mcg total) by mouth daily. (Patient taking differently: Take 145 mcg by mouth daily. GI DR Tobi Bastos)   memantine (NAMENDA) 5 MG tablet Take 5 mg by mouth 2 (two) times daily.   metFORMIN (GLUCOPHAGE) 1000 MG tablet Take 1 tablet (1,000 mg total) by mouth 2 (two) times daily.   OneTouch Delica Lancets 30G MISC TEST ONCE DAILY   QUEtiapine (SEROQUEL) 50 MG tablet Take 50-100 mg by mouth at bedtime. Dr. Shane Crutch   venlafaxine XR (EFFEXOR-XR) 150 MG 24 hr capsule Take 1 capsule (150 mg total) by mouth daily with breakfast.   venlafaxine XR (EFFEXOR-XR) 37.5 MG 24 hr capsule Take 1 capsule (37.5 mg total) by mouth daily with breakfast.   [DISCONTINUED] lisinopril-hydrochlorothiazide (ZESTORETIC) 20-12.5 MG tablet Take 1 tablet by mouth daily.     Allergies:   Patient has no known allergies.   Social History   Socioeconomic History   Marital status: Married    Spouse name: Not on file   Number of children: 1   Years of education: Not on file   Highest education level: Associate degree: academic program  Occupational History   Occupation: Retired  Tobacco Use   Smoking status:  Never    Passive exposure: Never   Smokeless tobacco: Never   Tobacco comments:    smoking cessation materials not required  Vaping Use   Vaping status: Never Used  Substance and Sexual Activity   Alcohol use: Not Currently    Alcohol/week: 5.0 standard drinks of alcohol    Types: 2 Glasses of wine, 3 Cans of beer per week    Comment: weekly   Drug use: No   Sexual activity: Not Currently  Other Topics Concern   Not on file  Social History Narrative   Not on file   Social Drivers of Health   Financial Resource Strain: Low Risk  (05/19/2022)   Overall Financial Resource Strain (CARDIA)    Difficulty of Paying Living  Expenses: Not hard at all  Food Insecurity: No Food Insecurity (05/19/2022)   Hunger Vital Sign    Worried About Running Out of Food in the Last Year: Never true    Ran Out of Food in the Last Year: Never true  Transportation Needs: No Transportation Needs (05/19/2022)   PRAPARE - Administrator, Civil Service (Medical): No    Lack of Transportation (Non-Medical): No  Physical Activity: Insufficiently Active (05/19/2022)   Exercise Vital Sign    Days of Exercise per Week: 7 days    Minutes of Exercise per Session: 20 min  Stress: No Stress Concern Present (05/19/2022)   [employer] of Occupational Health - Occupational Stress Questionnaire    Feeling of Stress : Not at all  Social Connections: Moderately Isolated (05/19/2022)   Social Connection and Isolation Panel [NHANES]    Frequency of Communication with Friends and Family: More than three times a week    Frequency of Social Gatherings with Friends and Family: Once a week    Attends Religious Services: Never    Database administrator or Organizations: No    Attends Engineer, structural: Never    Marital Status: Married     Family History: The patient's family history includes Healthy in his father; Heart disease in his mother.  ROS:   Please see the history of present illness.     All other systems reviewed and are negative.  EKGs/Labs/Other Studies Reviewed:    The following studies were reviewed today:   EKG Interpretation Date/Time:  Wednesday May 11 2023 08:58:41 EST Ventricular Rate:  61 PR Interval:  148 QRS Duration:  98 QT Interval:  438 QTC Calculation: 440 R Axis:   -51  Text Interpretation: Sinus rhythm with Premature atrial complexes Left anterior fascicular block Nonspecific ST and T wave abnormality Confirmed by Debbe Odea ([id number]) on 05/11/2023 9:29:56 AM    Recent Labs: 02/10/2023: ALT 19; BUN 21; Creatinine, Ser 1.03; Potassium 4.5; Sodium 145  Recent Lipid  Panel    Component Value Date/Time   CHOL 138 02/10/2023 1106   TRIG 104 02/10/2023 1106   HDL 47 02/10/2023 1106   CHOLHDL 3.3 07/31/2018 1131   LDLCALC 72 02/10/2023 1106     Risk Assessment/Calculations:      Physical Exam:    VS:  BP (!) 148/78 (BP Location: Left Arm, Patient Position: Sitting, Cuff Size: Normal)   Pulse (!) 51   Ht 6\' 1"  (1.854 m)   Wt 177 lb 3.2 oz (80.4 kg)   SpO2 97%   BMI 23.38 kg/m     Wt Readings from Last 3 Encounters:  05/11/23 177 lb 3.2 oz (80.4 kg)  02/10/23 176 lb (79.8  kg)  11/23/22 172 lb 9.6 oz (78.3 kg)     GEN:  Well nourished, well developed in no acute distress HEENT: Normal NECK: No JVD; No carotid bruits CARDIAC: RRR, no murmurs, rubs, gallops RESPIRATORY:  Clear to auscultation without rales, wheezing or rhonchi  ABDOMEN: Soft, non-tender, non-distended MUSCULOSKELETAL:  No edema; No deformity  SKIN: Warm and dry NEUROLOGIC:  Alert and oriented x 3 PSYCHIATRIC:  Normal affect   ASSESSMENT:    1. Aortic dissection proximal to innominate (HCC)   2. Primary hypertension   3. Pure hypercholesterolemia   4. Essential (primary) hypertension    PLAN:    In order of problems listed above:  History of aortic dissection status post hemiarch repair.  Last CT in 04/2023 showing stable aortic root, aorta size at 3.8 cm.  Follows with [facility] surgery for frequent CT scans.  echo 2022 with normal EF, trivial AI.  Repeat echo. Hypertension, BP elevated.  Continue Norvasc 10, increase lisinopril-hydrochlorothiazide 20-12.5 mg to 2 tablets daily. Hyperlipidemia, continue Lipitor 40 mg daily.  Follow-up yearly.   Medication Adjustments/Labs and Tests Ordered: Current medicines are reviewed at length with the patient today.  Concerns regarding medicines are outlined above.  Orders Placed This Encounter  Procedures   EKG 12-Lead   ECHOCARDIOGRAM COMPLETE    Meds ordered this encounter  Medications   lisinopril-hydrochlorothiazide  (ZESTORETIC) 20-12.5 MG tablet    Sig: Take 2 tablets by mouth daily.    Dispense:  180 tablet    Refill:  0     Patient Instructions  Medication Instructions:   INCREASE lisinopril-hydrochlorothiazide (ZESTORETIC) 20-12.5 MG tablet - Take TWO tablets by mouth daily.   *If you need a refill on your cardiac medications before your next appointment, please call your pharmacy*   Lab Work:  None Ordered  If you have labs (blood work) drawn today and your tests are completely normal, you will receive your results only by: MyChart Message (if you have MyChart) OR A paper copy in the mail If you have any lab test that is abnormal or we need to change your treatment, we will call you to review the results.   Testing/Procedures:  Your physician has requested that you have an echocardiogram. Echocardiography is a painless test that uses sound waves to create images of your heart. It provides your doctor with information about the size and shape of your heart and how well your heart's chambers and valves are working. This procedure takes approximately one hour. There are no restrictions for this procedure. Please do NOT wear cologne, perfume, aftershave, or lotions (deodorant is allowed). Please arrive 15 minutes prior to your appointment time.  Please note: We ask at that you not bring children with you during ultrasound (echo/ vascular) testing. Due to room size and safety concerns, children are not allowed in the ultrasound rooms during exams. Our front office staff cannot provide observation of children in our lobby area while testing is being conducted. An adult accompanying a patient to their appointment will only be allowed in the ultrasound room at the discretion of the ultrasound technician under special circumstances. We apologize for any inconvenience.    Follow-Up: At [facility], you and your health needs are our priority.  As part of our continuing mission to provide  you with exceptional heart care, we have created designated Provider Care Teams.  These Care Teams include your primary Cardiologist (physician) and Advanced Practice Providers (APPs -  Physician Assistants and Nurse Practitioners)  who all work together to provide you with the care you need, when you need it.  We recommend signing up for the patient portal called "MyChart".  Sign up information is provided on this After Visit Summary.  MyChart is used to connect with patients for Virtual Visits (Telemedicine).  Patients are able to view lab/test results, encounter notes, upcoming appointments, etc.  Non-urgent messages can be sent to your provider as well.   To learn more about what you can do with MyChart, go to [email or web address].    Your next appointment:   5 month(s)  Provider:   You may see Debbe Odea, MD or one of the following Advanced Practice Providers on your designated Care Team:   Nicolasa Ducking, NP Eula Listen, PA-C Cadence Fransico Michael, PA-C Charlsie Quest, NP Carlos Levering, NP   Signed, Debbe Odea, MD  05/11/2023 10:56 AM    [facility]

## 2023-05-19 ENCOUNTER — Other Ambulatory Visit: Payer: Self-pay | Admitting: Cardiology

## 2023-05-19 DIAGNOSIS — I10 Essential (primary) hypertension: Secondary | ICD-10-CM

## 2023-05-19 DIAGNOSIS — I71011 Dissection of aortic arch: Secondary | ICD-10-CM

## 2023-05-19 DIAGNOSIS — E7800 Pure hypercholesterolemia, unspecified: Secondary | ICD-10-CM

## 2023-05-24 DIAGNOSIS — Z0000 Encounter for general adult medical examination without abnormal findings: Secondary | ICD-10-CM | POA: Diagnosis not present

## 2023-05-24 DIAGNOSIS — F339 Major depressive disorder, recurrent, unspecified: Secondary | ICD-10-CM | POA: Diagnosis not present

## 2023-05-24 DIAGNOSIS — E119 Type 2 diabetes mellitus without complications: Secondary | ICD-10-CM | POA: Diagnosis not present

## 2023-05-24 DIAGNOSIS — E782 Mixed hyperlipidemia: Secondary | ICD-10-CM | POA: Diagnosis not present

## 2023-05-24 DIAGNOSIS — K5909 Other constipation: Secondary | ICD-10-CM | POA: Diagnosis not present

## 2023-05-24 DIAGNOSIS — I10 Essential (primary) hypertension: Secondary | ICD-10-CM | POA: Diagnosis not present

## 2023-05-24 DIAGNOSIS — R1031 Right lower quadrant pain: Secondary | ICD-10-CM | POA: Diagnosis not present

## 2023-06-02 ENCOUNTER — Ambulatory Visit: Payer: PPO | Attending: Cardiology

## 2023-06-02 DIAGNOSIS — I71011 Dissection of aortic arch: Secondary | ICD-10-CM | POA: Diagnosis not present

## 2023-06-02 LAB — ECHOCARDIOGRAM COMPLETE
AV Mean grad: 2 mmHg
AV Peak grad: 4.2 mmHg
Ao pk vel: 1.03 m/s
Area-P 1/2: 1.87 cm2
S' Lateral: 4.1 cm

## 2023-06-26 ENCOUNTER — Other Ambulatory Visit: Payer: Self-pay | Admitting: Family Medicine

## 2023-06-26 DIAGNOSIS — I10 Essential (primary) hypertension: Secondary | ICD-10-CM

## 2023-06-27 ENCOUNTER — Other Ambulatory Visit: Payer: Self-pay

## 2023-06-27 DIAGNOSIS — I10 Essential (primary) hypertension: Secondary | ICD-10-CM

## 2023-06-27 MED ORDER — LISINOPRIL-HYDROCHLOROTHIAZIDE 20-12.5 MG PO TABS
2.0000 | ORAL_TABLET | Freq: Every day | ORAL | 3 refills | Status: AC
Start: 2023-06-27 — End: ?

## 2023-07-26 ENCOUNTER — Other Ambulatory Visit: Payer: Self-pay | Admitting: Family Medicine

## 2023-07-26 DIAGNOSIS — F339 Major depressive disorder, recurrent, unspecified: Secondary | ICD-10-CM

## 2023-08-01 ENCOUNTER — Other Ambulatory Visit: Payer: Self-pay

## 2023-08-01 DIAGNOSIS — N50811 Right testicular pain: Secondary | ICD-10-CM

## 2023-08-02 ENCOUNTER — Ambulatory Visit: Admitting: Urology

## 2023-08-02 ENCOUNTER — Encounter: Payer: Self-pay | Admitting: Urology

## 2023-08-02 ENCOUNTER — Other Ambulatory Visit
Admission: RE | Admit: 2023-08-02 | Discharge: 2023-08-02 | Disposition: A | Discharge status: Home / Self Care | Attending: Urology | Admitting: Urology

## 2023-08-02 VITALS — BP 150/71 | HR 65 | Ht 73.0 in | Wt 172.0 lb

## 2023-08-02 DIAGNOSIS — N50811 Right testicular pain: Secondary | ICD-10-CM | POA: Insufficient documentation

## 2023-08-02 DIAGNOSIS — R102 Pelvic and perineal pain: Secondary | ICD-10-CM

## 2023-08-02 LAB — URINALYSIS, COMPLETE (UACMP) WITH MICROSCOPIC
Bilirubin Urine: NEGATIVE
Glucose, UA: NEGATIVE mg/dL
Hgb urine dipstick: NEGATIVE
Ketones, ur: NEGATIVE mg/dL
Leukocytes,Ua: NEGATIVE
Nitrite: NEGATIVE
Protein, ur: 30 mg/dL — AB
Specific Gravity, Urine: 1.02 (ref 1.005–1.030)
pH: 6 (ref 5.0–8.0)

## 2023-08-02 NOTE — Patient Instructions (Signed)
 Andrew [name]

## 2023-08-02 NOTE — Progress Notes (Signed)
   08/02/2023 9:07 AM   Verlean Glee 05-28-1950 [id number]  Reason for visit: Follow up scrotal pain  HPI: 73 year old male who I have seen previously for nonobstructing nephrolithiasis, peyronies disease not interested in further treatments, and "dark-colored urine" who made an appointment today for right testicular pain.  This has been present for about a year, and only has happened a few times.  He is unable to identify any aggravating or alleviating factors.  Pain overall is relatively mild and does not interfere with his daily activity.  He denies any urinary symptoms or gross hematuria.  Urinalysis today is benign.  On exam, testicles 20 cc and descended bilaterally, no masses or lesions, mild tenderness of the right epididymis.  He had a CT abdomen and pelvis at [facility] in February 2025 benign from urologic perspective, no hydronephrosis, left lower pole nonobstructive stone, no evidence of hernia.  We discussed options including further evaluation of the scrotal ultrasound, or conservative treatments with NSAIDs and not fitting underwear.  He is not bothered enough to consider ultrasound at this time but would reconsider if symptoms worsen.  Follow-up with urology as needed  Lawerence Pressman, MD  [facility] [address] [place], [place] [address] [phone]

## 2023-09-01 DIAGNOSIS — G3184 Mild cognitive impairment, so stated: Secondary | ICD-10-CM | POA: Diagnosis not present

## 2023-09-20 ENCOUNTER — Ambulatory Visit: Payer: PPO | Attending: Cardiology | Admitting: Cardiology

## 2023-09-20 ENCOUNTER — Encounter: Payer: Self-pay | Admitting: Cardiology

## 2023-09-20 VITALS — BP 130/68 | HR 66 | Ht 73.0 in | Wt 169.8 lb

## 2023-09-20 DIAGNOSIS — I71011 Dissection of aortic arch: Secondary | ICD-10-CM

## 2023-09-20 DIAGNOSIS — I10 Essential (primary) hypertension: Secondary | ICD-10-CM | POA: Diagnosis not present

## 2023-09-20 DIAGNOSIS — E7800 Pure hypercholesterolemia, unspecified: Secondary | ICD-10-CM | POA: Diagnosis not present

## 2023-09-20 NOTE — Patient Instructions (Signed)
 Medication Instructions:   Your physician recommends that you continue on your current medications as directed. Please refer to the Current Medication list given to you today.   *If you need a refill on your cardiac medications before your next appointment, please call your pharmacy*  Lab Work:  No labs ordered today   If you have labs (blood work) drawn today and your tests are completely normal, you will receive your results only by: MyChart Message (if you have MyChart) OR A paper copy in the mail If you have any lab test that is abnormal or we need to change your treatment, we will call you to review the results.  Testing/Procedures:  No test ordered today   Follow-Up: At [facility], you and your health needs are our priority.  As part of our continuing mission to provide you with exceptional heart care, our providers are all part of one team.  This team includes your primary Cardiologist (physician) and Advanced Practice Providers or APPs (Physician Assistants and Nurse Practitioners) who all work together to provide you with the care you need, when you need it.  Your next appointment:   3 month(s)  Provider:   You may see Constancia Delton, MD or one of the following Advanced Practice Providers on your designated Care Team:   Laneta Pintos, NP Gildardo Labrador, PA-C Varney Gentleman, PA-C Cadence Francis, PA-C Ronald Cockayne, NP Morey Ar, NP    We recommend signing up for the patient portal called MyChart.  Sign up information is provided on this After Visit Summary.  MyChart is used to connect with patients for Virtual Visits (Telemedicine).  Patients are able to view lab/test results, encounter notes, upcoming appointments, etc.  Non-urgent messages can be sent to your provider as well.   To learn more about what you can do with MyChart, go to [email or web address].

## 2023-09-20 NOTE — Progress Notes (Signed)
 Cardiology Office Note:    Date:  09/20/2023   ID:  Toribio LITTIE Derby, DOB Aug 24, 1950, MRN [id number]  PCP:  Salli Amato, MD   [facility]  Cardiologist:  Redell Cave, MD  Advanced Practice Provider:  No care team member to display Electrophysiologist:  None       Referring MD: Joshua Cathryne BROCKS, MD   No chief complaint on file.   History of Present Illness:    Andrew [name] is a 73 y.o. male with a hx of type A aortic dissection (s/p hemiarch repair) at [facility], hypertension, hyperlipidemia, diabetes who presents for follow-up.    Previously seen due to elevated BP, lisinopril -HCTZ was increased.  Tolerating medications with no adverse effects.  Feels well, has no concerns today.  Denies dizziness or syncope.  BP at home slightly elevated with systolic in the 140s to 150s.   Prior notes/testing Lexiscan  Myoview  03/2022 no significant ischemia, low risk study Echo 06/2020 EF 50%, normal aortic valve prosthesis  Past Medical History:  Diagnosis Date   Depression    Diabetes mellitus without complication (HCC)    Hyperlipidemia    Hypertension     Past Surgical History:  Procedure Laterality Date   COLONOSCOPY WITH PROPOFOL  N/A 07/13/2019   Procedure: COLONOSCOPY WITH PROPOFOL ;  Surgeon: Jinny Carmine, MD;  Location: [facility] ENDOSCOPY;  Service: Endoscopy;  Laterality: N/A;   NASAL SINUS SURGERY     REPAIR OF ACUTE ASCENDING THORACIC AORTIC DISSECTION  12/2018    Current Medications: Current Meds  Medication Sig   amLODipine  (NORVASC ) 10 MG tablet Take 1 tablet (10 mg total) by mouth daily.   aspirin  EC 81 MG tablet Take 81 mg by mouth daily. Swallow whole.   atorvastatin  (LIPITOR) 40 MG tablet Take 1 tablet (40 mg total) by mouth daily.   glipiZIDE  (GLUCOTROL  XL) 2.5 MG 24 hr tablet Take 1 tablet (2.5 mg total) by mouth daily with breakfast.   glucose blood (ONETOUCH VERIO) test strip 1 each by Other route daily. for testing    lisinopril -hydrochlorothiazide  (ZESTORETIC ) 20-12.5 MG tablet Take 2 tablets by mouth daily.   memantine (NAMENDA) 5 MG tablet Take 5 mg by mouth 2 (two) times daily.   metFORMIN  (GLUCOPHAGE ) 1000 MG tablet Take 1 tablet (1,000 mg total) by mouth 2 (two) times daily.   OneTouch Delica Lancets 30G MISC TEST ONCE DAILY   QUEtiapine (SEROQUEL) 50 MG tablet Take 50-100 mg by mouth at bedtime. Dr. Gualtieri   venlafaxine  XR (EFFEXOR -XR) 150 MG 24 hr capsule Take 1 capsule (150 mg total) by mouth daily with breakfast.   venlafaxine  XR (EFFEXOR -XR) 37.5 MG 24 hr capsule Take 1 capsule (37.5 mg total) by mouth daily with breakfast.     Allergies:   Patient has no known allergies.   Social History   Socioeconomic History   Marital status: Married    Spouse name: Not on file   Number of children: 1   Years of education: Not on file   Highest education level: Associate degree: occupational, Scientist, product/process development, or vocational program  Occupational History   Occupation: Retired  Tobacco Use   Smoking status: Never    Passive exposure: Never   Smokeless tobacco: Never   Tobacco comments:    smoking cessation materials not required  Vaping Use   Vaping status: Never Used  Substance and Sexual Activity   Alcohol use: Not Currently    Alcohol/week: 5.0 standard drinks of alcohol    Types: 2  Glasses of wine, 3 Cans of beer per week    Comment: weekly   Drug use: No   Sexual activity: Not Currently  Other Topics Concern   Not on file  Social History Narrative   Not on file   Social Drivers of Health   Financial Resource Strain: Low Risk  (05/24/2023)   [facility]   Overall Financial Resource Strain (CARDIA)    Difficulty of Paying Living Expenses: Not hard at all  Food Insecurity: No Food Insecurity (05/24/2023)   Received from [facility]   Hunger Vital Sign    Within the past 12 months, you worried that your food would run out before you got the  money to buy more.: Never true    Within the past 12 months, the food you bought just didn't last and you didn't have money to get more.: Never true  Transportation Needs: No Transportation Needs (05/24/2023)   Received from [facility]    In the past 12 months, has lack of transportation kept you from medical appointments or from getting medications?: No    Lack of Transportation (Non-Medical): No  Physical Activity: Insufficiently Active (05/18/2023)   Exercise Vital Sign    Days of Exercise per Week: 3 days    Minutes of Exercise per Session: 30 min  Stress: No Stress Concern Present (05/18/2023)   [employer] of Occupational Health - Occupational Stress Questionnaire    Feeling of Stress : Not at all  Social Connections: Moderately Isolated (05/18/2023)   Social Connection and Isolation Panel    Frequency of Communication with Friends and Family: Twice a week    Frequency of Social Gatherings with Friends and Family: Once a week    Attends Religious Services: Never    Database administrator or Organizations: No    Attends Engineer, structural: Not on file    Marital Status: Married     Family History: The patient's family history includes Healthy in his father; Heart disease in his mother.  ROS:   Please see the history of present illness.     All other systems reviewed and are negative.  EKGs/Labs/Other Studies Reviewed:    The following studies were reviewed today:   EKG Interpretation Date/Time:  Tuesday September 20 2023 11:46:01 EDT Ventricular Rate:  66 PR Interval:  154 QRS Duration:  98 QT Interval:  418 QTC Calculation: 438 R Axis:   -44  Text Interpretation: Normal sinus rhythm with sinus arrhythmia Left axis deviation Nonspecific T wave abnormality Confirmed by Darliss Rogue ([id number]) on 09/20/2023 11:53:28 AM    Recent Labs: 02/10/2023: ALT 19; BUN 21; Creatinine, Ser 1.03; Potassium 4.5; Sodium 145   Recent Lipid Panel    Component Value Date/Time   CHOL 138 02/10/2023 1106   TRIG 104 02/10/2023 1106   HDL 47 02/10/2023 1106   CHOLHDL 3.3 07/31/2018 1131   LDLCALC 72 02/10/2023 1106     Risk Assessment/Calculations:      Physical Exam:    VS:  BP 130/68 (BP Location: Left Arm, Patient Position: Sitting, Cuff Size: Normal)   Pulse 66   Ht 6' 1 (1.854 m)   Wt 169 lb 12.8 oz (77 kg)   SpO2 96%   BMI 22.40 kg/m     Wt Readings from Last 3 Encounters:  09/20/23 169 lb 12.8 oz (77 kg)  08/02/23 172 lb (78 kg)  05/11/23 177  lb 3.2 oz (80.4 kg)     GEN:  Well nourished, well developed in no acute distress HEENT: Normal NECK: No JVD; No carotid bruits CARDIAC: RRR, no murmurs, rubs, gallops RESPIRATORY:  Clear to auscultation without rales, wheezing or rhonchi  ABDOMEN: Soft, non-tender, non-distended MUSCULOSKELETAL:  No edema; No deformity  SKIN: Warm and dry NEUROLOGIC:  Alert and oriented x 3 PSYCHIATRIC:  Normal affect   ASSESSMENT:    1. Aortic dissection proximal to innominate (HCC)   2. Primary hypertension   3. Pure hypercholesterolemia     PLAN:    In order of problems listed above:  History of aortic dissection status post hemiarch repair.  Last CT in 04/2023 showing stable aortic root, aorta size at 3.8 cm.  Follows with [facility] surgery for frequent CT scans.  echo 2022 with normal EF, trivial AI.  Repeat echo. Hypertension, BP now controlled.  States BP being slightly elevated at home with systolics in the 140s to 150.  Continue Norvasc  10, lisinopril -hydrochlorothiazide  20-12.5 mg  2 tablets daily.  Advised to bring BP cuff at follow-up visit for comparison. Hyperlipidemia, continue Lipitor 40 mg daily.  Follow-up 3 months for BP.   Medication Adjustments/Labs and Tests Ordered: Current medicines are reviewed at length with the patient today.  Concerns regarding medicines are outlined above.  Orders Placed This Encounter  Procedures   EKG 12-Lead     No orders of the defined types were placed in this encounter.    Patient Instructions  Medication Instructions:  Your physician recommends that you continue on your current medications as directed. Please refer to the Current Medication list given to you today.   *If you need a refill on your cardiac medications before your next appointment, please call your pharmacy*  Lab Work: No labs ordered today  If you have labs (blood work) drawn today and your tests are completely normal, you will receive your results only by: MyChart Message (if you have MyChart) OR A paper copy in the mail If you have any lab test that is abnormal or we need to change your treatment, we will call you to review the results.  Testing/Procedures: No test ordered today   Follow-Up: At [facility], you and your health needs are our priority.  As part of our continuing mission to provide you with exceptional heart care, our providers are all part of one team.  This team includes your primary Cardiologist (physician) and Advanced Practice Providers or APPs (Physician Assistants and Nurse Practitioners) who all work together to provide you with the care you need, when you need it.  Your next appointment:   3 month(s)  Provider:   You may see Redell Cave, MD or one of the following Advanced Practice Providers on your designated Care Team:   Lonni Meager, NP Lesley Maffucci, PA-C Bernardino Bring, PA-C Cadence [place]asewood, PA-C Tylene Lunch, NP Barnie Hila, NP    We recommend signing up for the patient portal called MyChart.  Sign up information is provided on this After Visit Summary.  MyChart is used to connect with patients for Virtual Visits (Telemedicine).  Patients are able to view lab/test results, encounter notes, upcoming appointments, etc.  Non-urgent messages can be sent to your provider as well.   To learn more about what you can do with MyChart, go to [email or web address].       Signed, Redell Cave, MD  09/20/2023 4:10 PM    [facility]

## 2023-10-03 DIAGNOSIS — K5900 Constipation, unspecified: Secondary | ICD-10-CM | POA: Diagnosis not present

## 2023-11-07 ENCOUNTER — Other Ambulatory Visit: Payer: Self-pay

## 2023-11-07 ENCOUNTER — Emergency Department
Admission: EM | Admit: 2023-11-07 | Discharge: 2023-11-07 | Discharge status: Against Medical Advice | Attending: Emergency Medicine | Admitting: Emergency Medicine

## 2023-11-07 DIAGNOSIS — Z5321 Procedure and treatment not carried out due to patient leaving prior to being seen by health care provider: Secondary | ICD-10-CM | POA: Insufficient documentation

## 2023-11-07 DIAGNOSIS — M7989 Other specified soft tissue disorders: Secondary | ICD-10-CM | POA: Diagnosis not present

## 2023-11-07 NOTE — ED Triage Notes (Signed)
 Pt reports noticing a small quarter sized area that is swollen to medial left lower leg. Pt denies pain or injury to leg. No redness noted.

## 2023-11-08 DIAGNOSIS — I10 Essential (primary) hypertension: Secondary | ICD-10-CM | POA: Diagnosis not present

## 2023-11-08 DIAGNOSIS — D1724 Benign lipomatous neoplasm of skin and subcutaneous tissue of left leg: Secondary | ICD-10-CM | POA: Diagnosis not present

## 2023-11-08 DIAGNOSIS — M7989 Other specified soft tissue disorders: Secondary | ICD-10-CM | POA: Diagnosis not present

## 2023-11-09 DIAGNOSIS — H9190 Unspecified hearing loss, unspecified ear: Secondary | ICD-10-CM | POA: Diagnosis not present

## 2023-11-09 DIAGNOSIS — E569 Vitamin deficiency, unspecified: Secondary | ICD-10-CM | POA: Diagnosis not present

## 2023-11-09 DIAGNOSIS — G3184 Mild cognitive impairment, so stated: Secondary | ICD-10-CM | POA: Diagnosis not present

## 2023-11-10 DIAGNOSIS — F0670 Mild neurocognitive disorder due to known physiological condition without behavioral disturbance: Secondary | ICD-10-CM | POA: Diagnosis not present

## 2023-11-10 DIAGNOSIS — G3184 Mild cognitive impairment, so stated: Secondary | ICD-10-CM | POA: Diagnosis not present

## 2023-11-11 DIAGNOSIS — E538 Deficiency of other specified B group vitamins: Secondary | ICD-10-CM | POA: Diagnosis not present

## 2023-11-18 DIAGNOSIS — E538 Deficiency of other specified B group vitamins: Secondary | ICD-10-CM | POA: Diagnosis not present

## 2023-11-24 DIAGNOSIS — G3184 Mild cognitive impairment, so stated: Secondary | ICD-10-CM | POA: Diagnosis not present

## 2023-11-24 DIAGNOSIS — E782 Mixed hyperlipidemia: Secondary | ICD-10-CM | POA: Diagnosis not present

## 2023-11-24 DIAGNOSIS — I10 Essential (primary) hypertension: Secondary | ICD-10-CM | POA: Diagnosis not present

## 2023-11-24 DIAGNOSIS — F339 Major depressive disorder, recurrent, unspecified: Secondary | ICD-10-CM | POA: Diagnosis not present

## 2023-11-24 DIAGNOSIS — Z1331 Encounter for screening for depression: Secondary | ICD-10-CM | POA: Diagnosis not present

## 2023-11-25 DIAGNOSIS — E538 Deficiency of other specified B group vitamins: Secondary | ICD-10-CM | POA: Diagnosis not present

## 2023-12-02 DIAGNOSIS — E538 Deficiency of other specified B group vitamins: Secondary | ICD-10-CM | POA: Diagnosis not present

## 2023-12-08 DIAGNOSIS — L03116 Cellulitis of left lower limb: Secondary | ICD-10-CM | POA: Diagnosis not present

## 2023-12-19 ENCOUNTER — Ambulatory Visit: Admission: EM | Admit: 2023-12-19 | Discharge: 2023-12-19 | Disposition: A | Discharge status: Home / Self Care

## 2023-12-19 ENCOUNTER — Ambulatory Visit (INDEPENDENT_AMBULATORY_CARE_PROVIDER_SITE_OTHER)

## 2023-12-19 ENCOUNTER — Encounter: Payer: Self-pay | Admitting: Emergency Medicine

## 2023-12-19 DIAGNOSIS — M25552 Pain in left hip: Secondary | ICD-10-CM

## 2023-12-19 DIAGNOSIS — M19012 Primary osteoarthritis, left shoulder: Secondary | ICD-10-CM | POA: Diagnosis not present

## 2023-12-19 DIAGNOSIS — W19XXXA Unspecified fall, initial encounter: Secondary | ICD-10-CM

## 2023-12-19 DIAGNOSIS — S43422A Sprain of left rotator cuff capsule, initial encounter: Secondary | ICD-10-CM

## 2023-12-19 DIAGNOSIS — M25512 Pain in left shoulder: Secondary | ICD-10-CM | POA: Diagnosis not present

## 2023-12-19 DIAGNOSIS — Z043 Encounter for examination and observation following other accident: Secondary | ICD-10-CM | POA: Diagnosis not present

## 2023-12-19 MED ORDER — TRAMADOL HCL 50 MG PO TABS: 50.0000 mg | ORAL_TABLET | Freq: Three times a day (TID) | ORAL | 0 refills | Status: AC | PRN

## 2023-12-19 NOTE — Discharge Instructions (Addendum)
-   X-rays of your shoulder and hip were negative for fracture. - I am concerned that you sprained or possibly even tore something in your rotator cuff.  Try to perform stretches of your shoulder every day and continue with anti-inflammatory medication and Tylenol .  If the range of motion is not improving over the next week or symptoms worsen please follow-up with orthopedics to have your shoulder evaluated further. - I sent pain medication for you to use as needed for severe pain. - Use topical lidocaine  or lidocaine  patches, muscle rubs, heat and ice.

## 2023-12-19 NOTE — ED Provider Notes (Signed)
 [facility]    CSN: [id number] Arrival date & time: 12/19/23  1638      History   Chief Complaint Chief Complaint  Patient presents with   Fall    HPI Andrew [name] is a 73 y.o. male with history of hypertension, diabetes, hyperlipidemia and depression.  He presents today with his wife for injuries following accidental fall when he got out of the pool and slipped on water yesterday around 5 PM.  Patient reports falling onto his left side and injuring his left hip and left shoulder.  Patient has been using a walker to get around.  He is able to bear weight but it feels better to move himself along with a walker.  He says he occasionally uses a cane.  Patient denies any leg weakness or numbness.  No back pain.  He has not noticed any swelling or bruising.  He also has had pain in the left shoulder and limited range of motion of the left shoulder.  No history of shoulder issues.  Patient has been taking ibuprofen and Tylenol  and it has helped somewhat but he continues to have pain at night.  Denies head injury or loss of consciousness.  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 (HCC) 12/23/2018   Mixed hyperlipidemia 10/05/2016   Familial multiple lipoprotein-type hyperlipidemia 08/02/2014   Routine general medical examination at a health care facility 08/02/2014   Recurrent major depressive episodes 08/02/2014   Essential (primary) hypertension 08/02/2014   Diabetes mellitus, type 2 (HCC) 08/02/2014    Past Surgical History:  Procedure Laterality Date   COLONOSCOPY WITH PROPOFOL  N/A 07/13/2019   Procedure: COLONOSCOPY WITH PROPOFOL ;   Surgeon: Jinny Carmine, MD;  Location: [facility] ENDOSCOPY;  Service: Endoscopy;  Laterality: N/A;   NASAL SINUS SURGERY     REPAIR OF ACUTE ASCENDING THORACIC AORTIC DISSECTION  12/2018       Home Medications    Prior to Admission medications   Medication Sig Start Date End Date Taking? Authorizing Provider  linaclotide  (LINZESS ) 145 MCG CAPS capsule Take 145 mcg by mouth. 11/24/23 11/18/24 Yes [provider]  tamsulosin (FLOMAX) 0.4 MG CAPS capsule Take 0.4 mg by mouth daily. 11/01/23  Yes [provider]  traMADol  (ULTRAM ) 50 MG tablet Take 1 tablet (50 mg total) by mouth every 8 (eight) hours as needed for severe pain (pain score 7-10). 12/19/23  Yes Arvis Huxley B, PA-C  amLODipine  (NORVASC ) 10 MG tablet Take 1 tablet (10 mg total) by mouth daily. 02/10/23   Joshua Cathryne BROCKS, MD  aspirin  EC 81 MG tablet Take 81 mg by mouth daily. Swallow whole.    [provider]  atorvastatin  (LIPITOR) 40 MG tablet Take 1 tablet (40 mg total) by mouth daily. 02/10/23   Joshua Cathryne BROCKS, MD  glipiZIDE  (GLUCOTROL  XL) 2.5 MG 24 hr tablet Take 1 tablet (2.5 mg total) by mouth daily with breakfast. 02/10/23   Joshua Cathryne BROCKS, MD  glucose blood (ONETOUCH VERIO) test strip 1 each by Other route daily. for testing 02/10/23   Joshua Cathryne BROCKS, MD  lisinopril -hydrochlorothiazide  (ZESTORETIC ) 20-12.5 MG tablet Take 2  tablets by mouth daily. 06/27/23   Darliss Rogue, MD  memantine (NAMENDA) 5 MG tablet Take 5 mg by mouth 2 (two) times daily.    [provider]  metFORMIN  (GLUCOPHAGE ) 1000 MG tablet Take 1 tablet (1,000 mg total) by mouth 2 (two) times daily. 02/10/23   Joshua Cathryne BROCKS, MD  OneTouch Delica Lancets 30G MISC TEST ONCE DAILY 09/16/20   Jones, Deanna C, MD  QUEtiapine (SEROQUEL) 50 MG tablet Take 50-100 mg by mouth at bedtime. Dr. Gwenda 01/25/22   [provider]  venlafaxine  XR (EFFEXOR -XR) 150 MG 24 hr capsule Take 1 capsule (150 mg total) by mouth daily with  breakfast. 02/10/23   Joshua Cathryne BROCKS, MD  venlafaxine  XR (EFFEXOR -XR) 37.5 MG 24 hr capsule Take 1 capsule (37.5 mg total) by mouth daily with breakfast. 02/10/23   Joshua Cathryne BROCKS, MD    Family History Family History  Problem Relation Age of Onset   Heart disease Mother    Healthy Father     Social History Social History   Tobacco Use   Smoking status: Never    Passive exposure: Never   Smokeless tobacco: Never   Tobacco comments:    smoking cessation materials not required  Vaping Use   Vaping status: Never Used  Substance Use Topics   Alcohol use: Not Currently    Alcohol/week: 5.0 standard drinks of alcohol    Types: 2 Glasses of wine, 3 Cans of beer per week    Comment: weekly   Drug use: No     Allergies   Patient has no known allergies.   Review of Systems Review of Systems  Constitutional:  Negative for fatigue.  Respiratory:  Negative for shortness of breath.   Cardiovascular:  Negative for chest pain and palpitations.  Musculoskeletal:  Positive for arthralgias and gait problem. Negative for joint swelling.  Skin:  Negative for color change and wound.  Neurological:  Negative for dizziness, syncope, weakness, numbness and headaches.     Physical Exam Triage Vital Signs ED Triage Vitals  Encounter Vitals Group     BP 12/19/23 1708 (!) 149/85     Girls Systolic BP Percentile --      Girls Diastolic BP Percentile --      Boys Systolic BP Percentile --      Boys Diastolic BP Percentile --      Pulse Rate 12/19/23 1708 69     Resp 12/19/23 1708 16     Temp 12/19/23 1708 98.2 F (36.8 C)     Temp Source 12/19/23 1708 Oral     SpO2 12/19/23 1708 95 %     Weight --      Height --      Head Circumference --      Peak Flow --      Pain Score 12/19/23 1707 8     Pain Loc --      Pain Education --      Exclude from Growth Chart --    No data found.  Updated Vital Signs BP (!) 149/85 (BP Location: Right Arm)   Pulse 69   Temp 98.2 F (36.8 C)  (Oral)   Resp 16   SpO2 95%      Physical Exam Vitals and nursing note reviewed.  Constitutional:      General: He is not in acute distress.    Appearance: Normal appearance. He is well-developed. He is not ill-appearing.  HENT:     Head: Normocephalic  and atraumatic.  Eyes:     General: No scleral icterus.    Extraocular Movements: Extraocular movements intact.     Conjunctiva/sclera: Conjunctivae normal.     Pupils: Pupils are equal, round, and reactive to light.  Cardiovascular:     Rate and Rhythm: Normal rate and regular rhythm.  Pulmonary:     Effort: Pulmonary effort is normal. No respiratory distress.     Breath sounds: Normal breath sounds.  Musculoskeletal:     Left shoulder: Tenderness (generalized) present. No swelling. Decreased range of motion.     Cervical back: Neck supple.     Left hip: No deformity, tenderness or bony tenderness. Decreased range of motion.     Comments: Painful internal and external rotation of left hip  Skin:    General: Skin is warm and dry.     Capillary Refill: Capillary refill takes less than 2 seconds.  Neurological:     General: No focal deficit present.     Mental Status: He is alert. Mental status is at baseline.     Motor: No weakness.     Gait: Gait abnormal.  Psychiatric:        Mood and Affect: Mood normal.        Behavior: Behavior normal.      UC Treatments / Results  Labs (all labs ordered are listed, but only abnormal results are displayed) Labs Reviewed - No data to display  EKG   Radiology DG Shoulder Left Result Date: 12/19/2023 CLINICAL DATA:  Fall. EXAM: LEFT SHOULDER - 2+ VIEW COMPARISON:  Left shoulder x-ray 09/15/2017 FINDINGS: There is no acute fracture or dislocation. There is progression of glenohumeral joint space narrowing and osteophyte formation compatible with degenerative change. Soft tissues are within normal limits. IMPRESSION: 1. No acute fracture or dislocation. 2. Progression of glenohumeral  joint degenerative change. Electronically Signed   By: Greig Pique M.D.   On: 12/19/2023 17:59   DG Hip Unilat W or Wo Pelvis 2-3 Views Left Result Date: 12/19/2023 CLINICAL DATA:  Fall EXAM: DG HIP (WITH OR WITHOUT PELVIS) 2-3V LEFT COMPARISON:  None Available. FINDINGS: There is no evidence of hip fracture or dislocation. There is no evidence of arthropathy or other focal bone abnormality. IMPRESSION: Negative. Electronically Signed   By: Greig Pique M.D.   On: 12/19/2023 17:55    Procedures Procedures (including critical care time)  Medications Ordered in UC Medications - No data to display  Initial Impression / Assessment and Plan / UC Course  I have reviewed the triage vital signs and the nursing notes.  Pertinent labs & imaging results that were available during my care of the patient were reviewed by me and considered in my medical decision making (see chart for details).   73 year old male presents for accidental.  Slip and fall yesterday resulting in left shoulder and left hip pain.  Patient denies head injury or loss of consciousness.  X-rays of shoulder and hip obtained.  Negative imaging.  Reviewed results with patient and his wife.  Will have him continue over-the-counter medications, lidocaine  patches, muscle rubs, heat and ice.  Sent tramadol  as needed for severe pain.  There is some concern for possible rotator cuff injury so advised him to follow-up with orthopedics if the range of motion is not improving over the next week or if symptoms worsen.   Final Clinical Impressions(s) / UC Diagnoses   Final diagnoses:  Fall, initial encounter  Left hip pain  Acute pain of left  shoulder  Sprain of left rotator cuff capsule, initial encounter     Discharge Instructions      - X-rays of your shoulder and hip were negative for fracture. - I am concerned that you sprained or possibly even tore something in your rotator cuff.  Try to perform stretches of your shoulder  every day and continue with anti-inflammatory medication and Tylenol .  If the range of motion is not improving over the next week or symptoms worsen please follow-up with orthopedics to have your shoulder evaluated further. - I sent pain medication for you to use as needed for severe pain. - Use topical lidocaine  or lidocaine  patches, muscle rubs, heat and ice.   ED Prescriptions     Medication Sig Dispense Auth. Provider   traMADol  (ULTRAM ) 50 MG tablet Take 1 tablet (50 mg total) by mouth every 8 (eight) hours as needed for severe pain (pain score 7-10). 9 tablet Arvis Jolan NOVAK, PA-C      I have reviewed the PDMP during this encounter.   Arvis Jolan NOVAK, PA-C 12/19/23 [phone]

## 2023-12-19 NOTE — ED Triage Notes (Signed)
 Pt slipped on a wet garage floor yesterday. He has left hip and left shoulder pain. Pt denies hitting his head and denies loss of consciousness. Pt states the fall was witnessed. Pt has taken ibuprofen and tylenol .

## 2023-12-21 ENCOUNTER — Ambulatory Visit: Attending: Cardiology | Admitting: Cardiology

## 2023-12-21 ENCOUNTER — Encounter: Payer: Self-pay | Admitting: Cardiology

## 2023-12-21 VITALS — BP 137/72 | HR 61 | Ht 73.0 in | Wt 172.0 lb

## 2023-12-21 DIAGNOSIS — E7800 Pure hypercholesterolemia, unspecified: Secondary | ICD-10-CM | POA: Diagnosis not present

## 2023-12-21 DIAGNOSIS — I71011 Dissection of aortic arch: Secondary | ICD-10-CM | POA: Diagnosis not present

## 2023-12-21 DIAGNOSIS — I10 Essential (primary) hypertension: Secondary | ICD-10-CM

## 2023-12-21 NOTE — Patient Instructions (Signed)

## 2023-12-21 NOTE — Progress Notes (Signed)
 Cardiology Office Note:    Date:  12/21/2023   ID:  Andrew [name], DOB Feb 05, 1951, MRN [id number]  PCP:  Salli Amato, MD   [facility]  Cardiologist:  Redell Cave, MD  Advanced Practice Provider:  No care team member to display Electrophysiologist:  None       Referring MD: Salli Amato, MD   Chief Complaint  Patient presents with   Follow-up    15month follow up pt has been doing well with no complaints of chest pain, chest pressure or SOB, medciation reviewed verbally with patient    History of Present Illness:    Andrew [name] is a 73 y.o. male with a hx of type A aortic dissection (s/p hemiarch repair) at [facility] 2020, hypertension, hyperlipidemia, diabetes who presents for follow-up.    Doing okay, denies chest pain or shortness of breath, BP better controlled at home.  Compliant medications as prescribed.  Feels well, no concerns at this time.  Prior notes/testing Lexiscan  Myoview  03/2022 no significant ischemia, low risk study Echo 06/2020 EF 50%, normal aortic valve prosthesis  Past Medical History:  Diagnosis Date   Depression    Diabetes mellitus without complication (HCC)    Hyperlipidemia    Hypertension     Past Surgical History:  Procedure Laterality Date   COLONOSCOPY WITH PROPOFOL  N/A 07/13/2019   Procedure: COLONOSCOPY WITH PROPOFOL ;  Surgeon: Jinny Carmine, MD;  Location: [facility] ENDOSCOPY;  Service: Endoscopy;  Laterality: N/A;   NASAL SINUS SURGERY     REPAIR OF ACUTE ASCENDING THORACIC AORTIC DISSECTION  12/2018    Current Medications: Current Meds  Medication Sig   amLODipine  (NORVASC ) 10 MG tablet Take 1 tablet (10 mg total) by mouth daily.   aspirin  EC 81 MG tablet Take 81 mg by mouth daily. Swallow whole.   atorvastatin  (LIPITOR) 40 MG tablet Take 1 tablet (40 mg total) by mouth daily.   glipiZIDE  (GLUCOTROL  XL) 2.5 MG 24 hr tablet Take 1 tablet (2.5 mg total) by mouth daily with breakfast.   glucose blood  (ONETOUCH VERIO) test strip 1 each by Other route daily. for testing   lisinopril -hydrochlorothiazide  (ZESTORETIC ) 20-12.5 MG tablet Take 2 tablets by mouth daily.   memantine (NAMENDA) 5 MG tablet Take 5 mg by mouth 2 (two) times daily.   metFORMIN  (GLUCOPHAGE ) 1000 MG tablet Take 1 tablet (1,000 mg total) by mouth 2 (two) times daily.   OneTouch Delica Lancets 30G MISC TEST ONCE DAILY   QUEtiapine (SEROQUEL) 50 MG tablet Take 50-100 mg by mouth at bedtime. Dr. Gualtieri   traMADol  (ULTRAM ) 50 MG tablet Take 1 tablet (50 mg total) by mouth every 8 (eight) hours as needed for severe pain (pain score 7-10).   venlafaxine  XR (EFFEXOR -XR) 150 MG 24 hr capsule Take 1 capsule (150 mg total) by mouth daily with breakfast.   venlafaxine  XR (EFFEXOR -XR) 37.5 MG 24 hr capsule Take 1 capsule (37.5 mg total) by mouth daily with breakfast.     Allergies:   Patient has no known allergies.   Social History   Socioeconomic History   Marital status: Married    Spouse name: Not on file   Number of children: 1   Years of education: Not on file   Highest education level: Associate degree: occupational, Scientist, product/process development, or vocational program  Occupational History   Occupation: Retired  Tobacco Use   Smoking status: Never    Passive exposure: Never   Smokeless tobacco: Never   Tobacco comments:  smoking cessation materials not required  Vaping Use   Vaping status: Never Used  Substance and Sexual Activity   Alcohol use: Not Currently    Alcohol/week: 5.0 standard drinks of alcohol    Types: 2 Glasses of wine, 3 Cans of beer per week    Comment: weekly   Drug use: No   Sexual activity: Not Currently  Other Topics Concern   Not on file  Social History Narrative   Not on file   Social Drivers of Health   Financial Resource Strain: Low Risk  (05/24/2023)   Received from [facility]   Overall Financial Resource Strain (CARDIA)    Difficulty of Paying Living Expenses: Not hard at  all  Food Insecurity: No Food Insecurity (05/24/2023)   Received from [facility]   Hunger Vital Sign    Within the past 12 months, you worried that your food would run out before you got the money to buy more.: Never true    Within the past 12 months, the food you bought just didn't last and you didn't have money to get more.: Never true  Transportation Needs: No Transportation Needs (05/24/2023)   Received from [facility] - Transportation    In the past 12 months, has lack of transportation kept you from medical appointments or from getting medications?: No    Lack of Transportation (Non-Medical): No  Physical Activity: Insufficiently Active (05/18/2023)   Exercise Vital Sign    Days of Exercise per Week: 3 days    Minutes of Exercise per Session: 30 min  Stress: No Stress Concern Present (05/18/2023)   [employer] of Occupational Health - Occupational Stress Questionnaire    Feeling of Stress : Not at all  Social Connections: Moderately Isolated (05/18/2023)   Social Connection and Isolation Panel    Frequency of Communication with Friends and Family: Twice a week    Frequency of Social Gatherings with Friends and Family: Once a week    Attends Religious Services: Never    Database administrator or Organizations: No    Attends Engineer, structural: Not on file    Marital Status: Married     Family History: The patient's family history includes Healthy in his father; Heart disease in his mother.  ROS:   Please see the history of present illness.     All other systems reviewed and are negative.  EKGs/Labs/Other Studies Reviewed:    The following studies were reviewed today:        Recent Labs: 02/10/2023: ALT 19; BUN 21; Creatinine, Ser 1.03; Potassium 4.5; Sodium 145  Recent Lipid Panel    Component Value Date/Time   CHOL 138 02/10/2023 1106   TRIG 104 02/10/2023 1106   HDL 47 02/10/2023 1106   CHOLHDL 3.3  07/31/2018 1131   LDLCALC 72 02/10/2023 1106     Risk Assessment/Calculations:      Physical Exam:    VS:  BP 137/72 (BP Location: Left Arm, Patient Position: Sitting)   Pulse 61   Ht 6' 1 (1.854 m)   Wt 172 lb (78 kg)   SpO2 98%   BMI 22.69 kg/m     Wt Readings from Last 3 Encounters:  12/21/23 172 lb (78 kg)  11/07/23 170 lb (77.1 kg)  09/20/23 169 lb 12.8 oz (77 kg)     GEN:  Well nourished, well developed in no acute distress HEENT: Normal NECK: No JVD; No  carotid bruits CARDIAC: RRR, no murmurs, rubs, gallops RESPIRATORY:  Clear to auscultation without rales, wheezing or rhonchi  ABDOMEN: Soft, non-tender, non-distended MUSCULOSKELETAL:  No edema; No deformity  SKIN: Warm and dry NEUROLOGIC:  Alert and oriented x 3 PSYCHIATRIC:  Normal affect   ASSESSMENT:    1. Primary hypertension   2. 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 Pure hypercholesterolemia    PLAN:    In order of problems listed above:  Hypertension, BP controlled.  Continue Norvasc  10, lisinopril -hydrochlorothiazide  20-12.5 mg  2 tablets daily.   History of aortic dissection status post hemiarch repair.  Last CT in 04/2023 showing stable aortic root, aorta size at 3.8 cm.  Follows with [facility] surgery for frequent CT scans.  echo 2022 with normal EF, trivial AI.  Repeat echo. Hyperlipidemia, cholesterol controlled.  Continue Lipitor 40 mg daily.  Follow-up in 12 months.   Medication Adjustments/Labs and Tests Ordered: Current medicines are reviewed at length with the patient today.  Concerns regarding medicines are outlined above.  No orders of the defined types were placed in this encounter.   No orders of the defined types were placed in this encounter.    Patient Instructions  Medication Instructions:  Your physician recommends that you continue on your current medications as directed. Please refer to the Current Medication list given to you today.   *If you need a refill  on your cardiac medications before your next appointment, please call your pharmacy*  Lab Work: No labs ordered today  If you have labs (blood work) drawn today and your tests are completely normal, you will receive your results only by: MyChart Message (if you have MyChart) OR A paper copy in the mail If you have any lab test that is abnormal or we need to change your treatment, we will call you to review the results.  Testing/Procedures: No test ordered today   Follow-Up: At [facility], you and your health needs are our priority.  As part of our continuing mission to provide you with exceptional heart care, our providers are all part of one team.  This team includes your primary Cardiologist (physician) and Advanced Practice Providers or APPs (Physician Assistants and Nurse Practitioners) who all work together to provide you with the care you need, when you need it.  Your next appointment:   1 year(s)  Provider:   You may see Redell Cave, MD or one of the following Advanced Practice Providers on your designated Care Team:   Lonni Meager, NP Lesley Maffucci, PA-C Bernardino Bring, PA-C Cadence Reserve, PA-C Tylene Lunch, NP Barnie Hila, NP    We recommend signing up for the patient portal called MyChart.  Sign up information is provided on this After Visit Summary.  MyChart is used to connect with patients for Virtual Visits (Telemedicine).  Patients are able to view lab/test results, encounter notes, upcoming appointments, etc.  Non-urgent messages can be sent to your provider as well.   To learn more about what you can do with MyChart, go to [email or web address].              Signed, Redell Cave, MD  12/21/2023 12:10 PM    [facility]

## 2024-01-06 DIAGNOSIS — E538 Deficiency of other specified B group vitamins: Secondary | ICD-10-CM | POA: Diagnosis not present

## 2024-02-03 DIAGNOSIS — E538 Deficiency of other specified B group vitamins: Secondary | ICD-10-CM | POA: Diagnosis not present

## 2024-03-05 DIAGNOSIS — E538 Deficiency of other specified B group vitamins: Secondary | ICD-10-CM | POA: Diagnosis not present

## 2024-03-07 DIAGNOSIS — G3184 Mild cognitive impairment, so stated: Secondary | ICD-10-CM | POA: Diagnosis not present

## 2024-03-07 DIAGNOSIS — F09 Unspecified mental disorder due to known physiological condition: Secondary | ICD-10-CM | POA: Diagnosis not present
